# Patient Record
Sex: Female | Born: 1975 | Hispanic: No | Marital: Married | Smoking: Never smoker
Health system: Southern US, Community
[De-identification: ages and names within clinical notes are randomized; demographics above are authoritative.]

## PROBLEM LIST (undated history)

## (undated) ENCOUNTER — Inpatient Hospital Stay (HOSPITAL_COMMUNITY): Payer: Self-pay

## (undated) DIAGNOSIS — R011 Cardiac murmur, unspecified: Secondary | ICD-10-CM

## (undated) DIAGNOSIS — O24419 Gestational diabetes mellitus in pregnancy, unspecified control: Secondary | ICD-10-CM

## (undated) DIAGNOSIS — Z800 Family history of malignant neoplasm of digestive organs: Secondary | ICD-10-CM

## (undated) DIAGNOSIS — A159 Respiratory tuberculosis unspecified: Secondary | ICD-10-CM

## (undated) DIAGNOSIS — D0510 Intraductal carcinoma in situ of unspecified breast: Secondary | ICD-10-CM

## (undated) DIAGNOSIS — Z8041 Family history of malignant neoplasm of ovary: Secondary | ICD-10-CM

## (undated) HISTORY — PX: UPPER GASTROINTESTINAL ENDOSCOPY: SHX188

## (undated) HISTORY — DX: Gestational diabetes mellitus in pregnancy, unspecified control: O24.419

## (undated) HISTORY — DX: Family history of malignant neoplasm of ovary: Z80.41

## (undated) HISTORY — PX: APPENDECTOMY: SHX54

## (undated) HISTORY — DX: Respiratory tuberculosis unspecified: A15.9

## (undated) HISTORY — DX: Cardiac murmur, unspecified: R01.1

## (undated) HISTORY — DX: Family history of malignant neoplasm of digestive organs: Z80.0

---

## 2009-10-25 ENCOUNTER — Encounter: Admission: RE | Admit: 2009-10-25 | Discharge: 2009-10-25 | Payer: Self-pay | Admitting: Infectious Diseases

## 2010-01-19 ENCOUNTER — Ambulatory Visit (HOSPITAL_COMMUNITY)
Admission: RE | Admit: 2010-01-19 | Discharge: 2010-01-19 | Payer: Self-pay | Source: Home / Self Care | Attending: Family Medicine | Admitting: Family Medicine

## 2010-01-19 ENCOUNTER — Encounter (INDEPENDENT_AMBULATORY_CARE_PROVIDER_SITE_OTHER): Payer: Self-pay | Admitting: Family Medicine

## 2010-02-20 ENCOUNTER — Encounter (INDEPENDENT_AMBULATORY_CARE_PROVIDER_SITE_OTHER): Payer: Self-pay | Admitting: Family Medicine

## 2010-02-20 LAB — CONVERTED CEMR LAB
Basophils Absolute: 0 10*3/uL (ref 0.0–0.1)
Basophils Relative: 1 % (ref 0–1)
Eosinophils Relative: 3 % (ref 0–5)
HCT: 39.1 % (ref 36.0–46.0)
Hemoglobin: 12 g/dL (ref 12.0–15.0)
MCHC: 30.7 g/dL (ref 30.0–36.0)
Monocytes Absolute: 0.3 10*3/uL (ref 0.1–1.0)
Monocytes Relative: 7 % (ref 3–12)
RBC: 4.53 M/uL (ref 3.87–5.11)
RDW: 19 % — ABNORMAL HIGH (ref 11.5–15.5)

## 2010-07-25 ENCOUNTER — Other Ambulatory Visit (HOSPITAL_COMMUNITY): Payer: Self-pay | Admitting: Family Medicine

## 2010-07-25 ENCOUNTER — Ambulatory Visit (HOSPITAL_COMMUNITY)
Admission: RE | Admit: 2010-07-25 | Discharge: 2010-07-25 | Disposition: A | Discharge status: Home / Self Care | Payer: Medicaid Other | Source: Ambulatory Visit | Attending: Family Medicine | Admitting: Family Medicine

## 2010-07-25 DIAGNOSIS — M25579 Pain in unspecified ankle and joints of unspecified foot: Secondary | ICD-10-CM

## 2011-02-26 ENCOUNTER — Ambulatory Visit
Admission: RE | Admit: 2011-02-26 | Discharge: 2011-02-26 | Disposition: A | Discharge status: Home / Self Care | Payer: Medicaid Other | Source: Ambulatory Visit | Attending: Neurology | Admitting: Neurology

## 2011-02-26 ENCOUNTER — Other Ambulatory Visit: Payer: Self-pay | Admitting: Neurology

## 2011-02-26 DIAGNOSIS — G971 Other reaction to spinal and lumbar puncture: Secondary | ICD-10-CM

## 2011-02-26 NOTE — Progress Notes (Signed)
Explained discharge instructions to pt's niece as pt does not speak english.  The niece explained the instructions to the pt and niece states that the pt understands.

## 2013-01-01 NOTE — L&D Delivery Note (Signed)
Delivery Note  Progressed quickly to complete dialtation.At 1:50 AM a viable and healthy female was delivered via Vaginal, Spontaneous Delivery (Presentation:LOA ;  ).  APGAR: 9, 9; weight 7 lb 4 oz (3289 g).   Placenta status: Intact, Spontaneous.  Cord: 3 vessels with the following complications: . No difficulty with shoulders  Anesthesia: Epidural  Episiotomy: None Lacerations: 1st degree Suture Repair: 3.0 monocryl Est. Blood Loss (mL): 150  Mom to postpartum.  Baby to Couplet care / Skin to Skin. Supervised by Carmelia Roller CNM  [place], [place] 08/24/2013, 3:55 AM  Attended delivery with student Agree with note Seabron Spates, CNM

## 2013-03-30 LAB — PREGNANCY, URINE

## 2013-04-13 ENCOUNTER — Ambulatory Visit (INDEPENDENT_AMBULATORY_CARE_PROVIDER_SITE_OTHER): Payer: 59 | Admitting: Family Medicine

## 2013-04-13 ENCOUNTER — Encounter: Payer: Self-pay | Admitting: Family Medicine

## 2013-04-13 ENCOUNTER — Other Ambulatory Visit (HOSPITAL_COMMUNITY)
Admission: RE | Admit: 2013-04-13 | Discharge: 2013-04-13 | Disposition: A | Discharge status: Home / Self Care | Payer: Medicaid Other | Source: Ambulatory Visit | Attending: Family Medicine | Admitting: Family Medicine

## 2013-04-13 VITALS — BP 96/66 | Temp 97.8°F | Ht 62.0 in | Wt 122.8 lb

## 2013-04-13 DIAGNOSIS — Z3490 Encounter for supervision of normal pregnancy, unspecified, unspecified trimester: Secondary | ICD-10-CM

## 2013-04-13 DIAGNOSIS — Z789 Other specified health status: Secondary | ICD-10-CM | POA: Insufficient documentation

## 2013-04-13 DIAGNOSIS — Z113 Encounter for screening for infections with a predominantly sexual mode of transmission: Secondary | ICD-10-CM | POA: Insufficient documentation

## 2013-04-13 DIAGNOSIS — Z1151 Encounter for screening for human papillomavirus (HPV): Secondary | ICD-10-CM | POA: Insufficient documentation

## 2013-04-13 DIAGNOSIS — Z609 Problem related to social environment, unspecified: Secondary | ICD-10-CM

## 2013-04-13 DIAGNOSIS — Z124 Encounter for screening for malignant neoplasm of cervix: Secondary | ICD-10-CM | POA: Insufficient documentation

## 2013-04-13 DIAGNOSIS — O09529 Supervision of elderly multigravida, unspecified trimester: Secondary | ICD-10-CM | POA: Insufficient documentation

## 2013-04-13 DIAGNOSIS — Z3480 Encounter for supervision of other normal pregnancy, unspecified trimester: Secondary | ICD-10-CM

## 2013-04-13 LAB — POCT URINALYSIS DIP (DEVICE)
Bilirubin Urine: NEGATIVE
GLUCOSE, UA: NEGATIVE mg/dL
Hgb urine dipstick: NEGATIVE
Ketones, ur: NEGATIVE mg/dL
NITRITE: NEGATIVE
PH: 7 (ref 5.0–8.0)
Protein, ur: NEGATIVE mg/dL
SPECIFIC GRAVITY, URINE: 1.015 (ref 1.005–1.030)
UROBILINOGEN UA: 0.2 mg/dL (ref 0.0–1.0)

## 2013-04-13 NOTE — Progress Notes (Signed)
   Subjective:    Toni Crawford is a [id number] [redacted]w[redacted]d being seen today for her first obstetrical visit.  Her obstetrical history is significant for advanced maternal age. Patient does intend to breast feed. Pregnancy history fully reviewed.  Patient reports no complaints.  Filed Vitals:   04/13/13 0821 04/13/13 0822  BP: 96/66   Temp: 97.8 F (36.6 C)   Height:  5\' 2"  (1.575 m)  Weight: 122 lb 12.8 oz (55.702 kg)     HISTORY: OB History  Gravida Para Term Preterm AB SAB TAB Ectopic Multiple Living  6 4 4  1 1    3     # Outcome Date GA Lbr Len/2nd Weight Sex Delivery Anes PTL Lv  6 CUR           5 SAB 2011          4 TRM 2009 [redacted]w[redacted]d   M SVD   Y  3 TRM 2006 [redacted]w[redacted]d   F SVD None  Y  2 TRM 2000 [redacted]w[redacted]d   F SVD   N     Comments: Died as a results of an accident-10 months  1 TRM 1998 [redacted]w[redacted]d   M SVD None  Y     Past Medical History  Diagnosis Date  . Heart murmur    Past Surgical History  Procedure Laterality Date  . Appendectomy     Family History  Problem Relation Age of Onset  . Cancer Mother   . Kidney disease Father      Exam    Uterus:     Pelvic Exam:    Perineum: Normal Perineum   Vulva: Bartholin's, Urethra, Skene's normal   Vagina:  normal mucosa, normal discharge   Cervix: multiparous appearance and no bleeding following Pap   Adnexa: normal adnexa   Bony Pelvis: average  System: Breast:  normal appearance, no masses or tenderness   Skin: normal coloration and turgor, no rashes    Neurologic: normal   Extremities: normal strength, tone, and muscle mass   HEENT sclera clear, anicteric   Mouth/Teeth mucous membranes moist, pharynx normal without lesions   Neck supple   Cardiovascular: regular rate and rhythm, SEM 1/6 nml flow murmur of pregnancy   Respiratory:  appears well, vitals normal, no respiratory distress, acyanotic, normal RR, ear and throat exam is normal, neck free of mass or lymphadenopathy, chest clear, no wheezing, crepitations, rhonchi, normal  symmetric air entry   Abdomen: soft, non-tender; bowel sounds normal; no masses,  no organomegaly      Assessment:    Pregnancy: [id number] Patient Active Problem List   Diagnosis Date Noted  . Supervision of other normal pregnancy 04/13/2013    Priority: High  . AMA 04/13/2013    Priority: Medium        Plan:     Initial labs drawn. Prenatal vitamins. Problem list reviewed and updated. Genetic Screening discussed for AMA: for genetics referral and will get testing with them.Marland Kitchen  Ultrasound discussed; fetal survey: requested.  Follow up in 4 weeks.   Donnamae Jude 04/13/2013

## 2013-04-13 NOTE — Progress Notes (Signed)
[id number] 

## 2013-04-13 NOTE — Progress Notes (Signed)
Nutrition note: 1st visit consult Pt has gained 7.8# @ [redacted]w[redacted]d, which is wnl. Pt reports eating 3 meals & 1 snack/d. Intake appears low in calcium rich foods. Pt is taking a PNV. Pt reports some nausea and heartburn occ but no vomiting. NKFA. Pt received written & verbal education via language line about general nutrition during pregnancy. Discussed importance and sources of calcium rich foods. Discussed wt gain goals of 25-35# or 1#/wk. Pt agrees to try to eat more calcium-rich foods. Pt has [place] & plans to BF. F/u as needed Vladimir Faster, MS, RD, LDN, [facility]

## 2013-04-13 NOTE — Patient Instructions (Signed)
Second Trimester of Pregnancy The second trimester is from week 13 through week 28, months 4 through 6. The second trimester is often a time when you feel your best. Your body has also adjusted to being pregnant, and you begin to feel better physically. Usually, morning sickness has lessened or quit completely, you may have more energy, and you may have an increase in appetite. The second trimester is also a time when the fetus is growing rapidly. At the end of the sixth month, the fetus is about 9 inches long and weighs about 1 pounds. You will likely begin to feel the baby move (quickening) between 18 and 20 weeks of the pregnancy. BODY CHANGES Your body goes through many changes during pregnancy. The changes vary from woman to woman.   Your weight will continue to increase. You will notice your lower abdomen bulging out.  You may begin to get stretch marks on your hips, abdomen, and breasts.  You may develop headaches that can be relieved by medicines approved by your caregiver.  You may urinate more often because the fetus is pressing on your bladder.  You may develop or continue to have heartburn as a result of your pregnancy.  You may develop constipation because certain hormones are causing the muscles that push waste through your intestines to slow down.  You may develop hemorrhoids or swollen, bulging veins (varicose veins).  You may have back pain because of the weight gain and pregnancy hormones relaxing your joints between the bones in your pelvis and as a result of a shift in weight and the muscles that support your balance.  Your breasts will continue to grow and be tender.  Your gums may bleed and may be sensitive to brushing and flossing.  Dark spots or blotches (chloasma, mask of pregnancy) may develop on your face. This will likely fade after the baby is born.  A dark line from your belly button to the pubic area (linea nigra) may appear. This will likely fade after  the baby is born. WHAT TO EXPECT AT YOUR PRENATAL VISITS During a routine prenatal visit:  You will be weighed to make sure you and the fetus are growing normally.  Your blood pressure will be taken.  Your abdomen will be measured to track your baby's growth.  The fetal heartbeat will be listened to.  Any test results from the previous visit will be discussed. Your caregiver may ask you:  How you are feeling.  If you are feeling the baby move.  If you have had any abnormal symptoms, such as leaking fluid, bleeding, severe headaches, or abdominal cramping.  If you have any questions. Other tests that may be performed during your second trimester include:  Blood tests that check for:  Low iron levels (anemia).  Gestational diabetes (between 24 and 28 weeks).  Rh antibodies.  Urine tests to check for infections, diabetes, or protein in the urine.  An ultrasound to confirm the proper growth and development of the baby.  An amniocentesis to check for possible genetic problems.  Fetal screens for spina bifida and Down syndrome. HOME CARE INSTRUCTIONS   Avoid all smoking, herbs, alcohol, and unprescribed drugs. These chemicals affect the formation and growth of the baby.  Follow your caregiver's instructions regarding medicine use. There are medicines that are either safe or unsafe to take during pregnancy.  Exercise only as directed by your caregiver. Experiencing uterine cramps is a good sign to stop exercising.  Continue to eat regular,  healthy meals.  Wear a good support bra for breast tenderness.  Do not use hot tubs, steam rooms, or saunas.  Wear your seat belt at all times when driving.  Avoid raw meat, uncooked cheese, cat litter boxes, and soil used by cats. These carry germs that can cause birth defects in the baby.  Take your prenatal vitamins.  Try taking a stool softener (if your caregiver approves) if you develop constipation. Eat more high-fiber  foods, such as fresh vegetables or fruit and whole grains. Drink plenty of fluids to keep your urine clear or pale yellow.  Take warm sitz baths to soothe any pain or discomfort caused by hemorrhoids. Use hemorrhoid cream if your caregiver approves.  If you develop varicose veins, wear support hose. Elevate your feet for 15 minutes, 3 4 times a day. Limit salt in your diet.  Avoid heavy lifting, wear low heel shoes, and practice good posture.  Rest with your legs elevated if you have leg cramps or low back pain.  Visit your dentist if you have not gone yet during your pregnancy. Use a soft toothbrush to brush your teeth and be gentle when you floss.  A sexual relationship may be continued unless your caregiver directs you otherwise.  Continue to go to all your prenatal visits as directed by your caregiver. SEEK MEDICAL CARE IF:   You have dizziness.  You have mild pelvic cramps, pelvic pressure, or nagging pain in the abdominal area.  You have persistent nausea, vomiting, or diarrhea.  You have a bad smelling vaginal discharge.  You have pain with urination. SEEK IMMEDIATE MEDICAL CARE IF:   You have a fever.  You are leaking fluid from your vagina.  You have spotting or bleeding from your vagina.  You have severe abdominal cramping or pain.  You have rapid weight gain or loss.  You have shortness of breath with chest pain.  You notice sudden or extreme swelling of your face, hands, ankles, feet, or legs.  You have not felt your baby move in over an hour.  You have severe headaches that do not go away with medicine.  You have vision changes. Document Released: 12/12/2000 Document Revised: 08/20/2012 Document Reviewed: 02/19/2012 [facility] Patient Information 2014 [place].  Breastfeeding Deciding to breastfeed is one of the best choices you can make for you and your baby. A change in hormones during pregnancy causes your breast tissue to grow and increases the  number and size of your milk ducts. These hormones also allow proteins, sugars, and fats from your blood supply to make breast milk in your milk-producing glands. Hormones prevent breast milk from being released before your baby is born as well as prompt milk flow after birth. Once breastfeeding has begun, thoughts of your baby, as well as his or her sucking or crying, can stimulate the release of milk from your milk-producing glands.  BENEFITS OF BREASTFEEDING For Your Baby  Your first milk (colostrum) helps your baby's digestive system function better.   There are antibodies in your milk that help your baby fight off infections.   Your baby has a lower incidence of asthma, allergies, and sudden infant death syndrome.   The nutrients in breast milk are better for your baby than infant formulas and are designed uniquely for your baby's needs.   Breast milk improves your baby's brain development.   Your baby is less likely to develop other conditions, such as childhood obesity, asthma, or type 2 diabetes mellitus.  For  You   Breastfeeding helps to create a very special bond between you and your baby.   Breastfeeding is convenient. Breast milk is always available at the correct temperature and costs nothing.   Breastfeeding helps to burn calories and helps you lose the weight gained during pregnancy.   Breastfeeding makes your uterus contract to its prepregnancy size faster and slows bleeding (lochia) after you give birth.   Breastfeeding helps to lower your risk of developing type 2 diabetes mellitus, osteoporosis, and breast or ovarian cancer later in life. SIGNS THAT YOUR BABY IS HUNGRY Early Signs of Hunger  Increased alertness or activity.  Stretching.  Movement of the head from side to side.  Movement of the head and opening of the mouth when the corner of the mouth or cheek is stroked (rooting).  Increased sucking sounds, smacking lips, cooing, sighing, or  squeaking.  Hand-to-mouth movements.  Increased sucking of fingers or hands. Late Signs of Hunger  Fussing.  Intermittent crying. Extreme Signs of Hunger Signs of extreme hunger will require calming and consoling before your baby will be able to breastfeed successfully. Do not wait for the following signs of extreme hunger to occur before you initiate breastfeeding:   Restlessness.  A loud, strong cry.   Screaming. BREASTFEEDING BASICS Breastfeeding Initiation  Find a comfortable place to sit or lie down, with your neck and back well supported.  Place a pillow or rolled up blanket under your baby to bring him or her to the level of your breast (if you are seated). Nursing pillows are specially designed to help support your arms and your baby while you breastfeed.  Make sure that your baby's abdomen is facing your abdomen.   Gently massage your breast. With your fingertips, massage from your chest wall toward your nipple in a circular motion. This encourages milk flow. You may need to continue this action during the feeding if your milk flows slowly.  Support your breast with 4 fingers underneath and your thumb above your nipple. Make sure your fingers are well away from your nipple and your baby's mouth.   Stroke your baby's lips gently with your finger or nipple.   When your baby's mouth is open wide enough, quickly bring your baby to your breast, placing your entire nipple and as much of the colored area around your nipple (areola) as possible into your baby's mouth.   More areola should be visible above your baby's upper lip than below the lower lip.   Your baby's tongue should be between his or her lower gum and your breast.   Ensure that your baby's mouth is correctly positioned around your nipple (latched). Your baby's lips should create a seal on your breast and be turned out (everted).  It is common for your baby to suck about 2 3 minutes in order to start the  flow of breast milk. Latching Teaching your baby how to latch on to your breast properly is very important. An improper latch can cause nipple pain and decreased milk supply for you and poor weight gain in your baby. Also, if your baby is not latched onto your nipple properly, he or she may swallow some air during feeding. This can make your baby fussy. Burping your baby when you switch breasts during the feeding can help to get rid of the air. However, teaching your baby to latch on properly is still the best way to prevent fussiness from swallowing air while breastfeeding. Signs that your baby has  successfully latched on to your nipple:    Silent tugging or silent sucking, without causing you pain.   Swallowing heard between every 3 4 sucks.    Muscle movement above and in front of his or her ears while sucking.  Signs that your baby has not successfully latched on to nipple:   Sucking sounds or smacking sounds from your baby while breastfeeding.  Nipple pain. If you think your baby has not latched on correctly, slip your finger into the corner of your baby's mouth to break the suction and place it between your baby's gums. Attempt breastfeeding initiation again. Signs of Successful Breastfeeding Signs from your baby:   A gradual decrease in the number of sucks or complete cessation of sucking.   Falling asleep.   Relaxation of his or her body.   Retention of a small amount of milk in his or her mouth.   Letting go of your breast by himself or herself. Signs from you:  Breasts that have increased in firmness, weight, and size 1 3 hours after feeding.   Breasts that are softer immediately after breastfeeding.  Increased milk volume, as well as a change in milk consistency and color by the 5th day of breastfeeding.   Nipples that are not sore, cracked, or bleeding. Signs That Your Randel Books is Getting Enough Milk  Wetting at least 3 diapers in a 24-hour period. The urine  should be clear and pale yellow by age 64 days.  At least 3 stools in a 24-hour period by age 64 days. The stool should be soft and yellow.  At least 3 stools in a 24-hour period by age 616 days. The stool should be seedy and yellow.  No loss of weight greater than 10% of birth weight during the first 91 days of age.  Average weight gain of 4 7 ounces (120 210 mL) per week after age 61 days.  Consistent daily weight gain by age 65 days, without weight loss after the age of 2 weeks. After a feeding, your baby may spit up a small amount. This is common. BREASTFEEDING FREQUENCY AND DURATION Frequent feeding will help you make more milk and can prevent sore nipples and breast engorgement. Breastfeed when you feel the need to reduce the fullness of your breasts or when your baby shows signs of hunger. This is called "breastfeeding on demand." Avoid introducing a pacifier to your baby while you are working to establish breastfeeding (the first 4 6 weeks after your baby is born). After this time you may choose to use a pacifier. Research has shown that pacifier use during the first year of a baby's life decreases the risk of sudden infant death syndrome (SIDS). Allow your baby to feed on each breast as long as he or she wants. Breastfeed until your baby is finished feeding. When your baby unlatches or falls asleep while feeding from the first breast, offer the second breast. Because newborns are often sleepy in the first few weeks of life, you may need to awaken your baby to get him or her to feed. Breastfeeding times will vary from baby to baby. However, the following rules can serve as a guide to help you ensure that your baby is properly fed:  Newborns (babies 53 weeks of age or younger) may breastfeed every 1 3 hours.  Newborns should not go longer than 3 hours during the day or 5 hours during the night without breastfeeding.  You should breastfeed your baby a minimum of  8 times in a 24-hour period until  you begin to introduce solid foods to your baby at around 55 months of age. BREAST MILK PUMPING Pumping and storing breast milk allows you to ensure that your baby is exclusively fed your breast milk, even at times when you are unable to breastfeed. This is especially important if you are going back to work while you are still breastfeeding or when you are not able to be present during feedings. Your lactation consultant can give you guidelines on how long it is safe to store breast milk.  A breast pump is a machine that allows you to pump milk from your breast into a sterile bottle. The pumped breast milk can then be stored in a refrigerator or freezer. Some breast pumps are operated by hand, while others use electricity. Ask your lactation consultant which type will work best for you. Breast pumps can be purchased, but some hospitals and breastfeeding support groups lease breast pumps on a monthly basis. A lactation consultant can teach you how to hand express breast milk, if you prefer not to use a pump.  CARING FOR YOUR BREASTS WHILE YOU BREASTFEED Nipples can become dry, cracked, and sore while breastfeeding. The following recommendations can help keep your breasts moisturized and healthy:  Avoid using soap on your nipples.   Wear a supportive bra. Although not required, special nursing bras and tank tops are designed to allow access to your breasts for breastfeeding without taking off your entire bra or top. Avoid wearing underwire style bras or extremely tight bras.  Air dry your nipples for 3 4minutes after each feeding.   Use only cotton bra pads to absorb leaked breast milk. Leaking of breast milk between feedings is normal.   Use lanolin on your nipples after breastfeeding. Lanolin helps to maintain your skin's normal moisture barrier. If you use pure lanolin you do not need to wash it off before feeding your baby again. Pure lanolin is not toxic to your baby. You may also hand express a  few drops of breast milk and gently massage that milk into your nipples and allow the milk to air dry. In the first few weeks after giving birth, some women experience extremely full breasts (engorgement). Engorgement can make your breasts feel heavy, warm, and tender to the touch. Engorgement peaks within 3 5 days after you give birth. The following recommendations can help ease engorgement:  Completely empty your breasts while breastfeeding or pumping. You may want to start by applying warm, moist heat (in the shower or with warm water-soaked hand towels) just before feeding or pumping. This increases circulation and helps the milk flow. If your baby does not completely empty your breasts while breastfeeding, pump any extra milk after he or she is finished.  Wear a snug bra (nursing or regular) or tank top for 1 2 days to signal your body to slightly decrease milk production.  Apply ice packs to your breasts, unless this is too uncomfortable for you.  Make sure that your baby is latched on and positioned properly while breastfeeding. If engorgement persists after 48 hours of following these recommendations, contact your health care provider or a Science writer. OVERALL HEALTH CARE RECOMMENDATIONS WHILE BREASTFEEDING  Eat healthy foods. Alternate between meals and snacks, eating 3 of each per day. Because what you eat affects your breast milk, some of the foods may make your baby more irritable than usual. Avoid eating these foods if you are sure that they are  negatively affecting your baby.  Drink milk, fruit juice, and water to satisfy your thirst (about 10 glasses a day).   Rest often, relax, and continue to take your prenatal vitamins to prevent fatigue, stress, and anemia.  Continue breast self-awareness checks.  Avoid chewing and smoking tobacco.  Avoid alcohol and drug use. Some medicines that may be harmful to your baby can pass through breast milk. It is important to ask your  health care provider before taking any medicine, including all over-the-counter and prescription medicine as well as vitamin and herbal supplements. It is possible to become pregnant while breastfeeding. If birth control is desired, ask your health care provider about options that will be safe for your baby. SEEK MEDICAL CARE IF:   You feel like you want to stop breastfeeding or have become frustrated with breastfeeding.  You have painful breasts or nipples.  Your nipples are cracked or bleeding.  Your breasts are red, tender, or warm.  You have a swollen area on either breast.  You have a fever or chills.  You have nausea or vomiting.  You have drainage other than breast milk from your nipples.  Your breasts do not become full before feedings by the 5th day after you give birth.  You feel sad and depressed.  Your baby is too sleepy to eat well.  Your baby is having trouble sleeping.   Your baby is wetting less than 3 diapers in a 24-hour period.  Your baby has less than 3 stools in a 24-hour period.  Your baby's skin or the white part of his or her eyes becomes yellow.   Your baby is not gaining weight by 5 days of age. SEEK IMMEDIATE MEDICAL CARE IF:   Your baby is overly tired (lethargic) and does not want to wake up and feed.  Your baby develops an unexplained fever. Document Released: 12/18/2004 Document Revised: 08/20/2012 Document Reviewed: 06/11/2012 [employer] Patient Information 2014 [employer].  

## 2013-04-13 NOTE — Progress Notes (Signed)
MFM appointment scheduled 04/21/13 at 10 am for Genetic Counseling and an U/S.

## 2013-04-14 LAB — OBSTETRIC PANEL
Antibody Screen: NEGATIVE
BASOS ABS: 0 10*3/uL (ref 0.0–0.1)
Basophils Relative: 0 % (ref 0–1)
EOS ABS: 0.1 10*3/uL (ref 0.0–0.7)
EOS PCT: 1 % (ref 0–5)
HCT: 37 % (ref 36.0–46.0)
Hemoglobin: 12.2 g/dL (ref 12.0–15.0)
Hepatitis B Surface Ag: NEGATIVE
LYMPHS ABS: 1 10*3/uL (ref 0.7–4.0)
Lymphocytes Relative: 16 % (ref 12–46)
MCH: 31 pg (ref 26.0–34.0)
MCHC: 33 g/dL (ref 30.0–36.0)
MCV: 93.9 fL (ref 78.0–100.0)
Monocytes Absolute: 0.3 10*3/uL (ref 0.1–1.0)
Monocytes Relative: 5 % (ref 3–12)
NEUTROS PCT: 78 % — AB (ref 43–77)
Neutro Abs: 5.1 10*3/uL (ref 1.7–7.7)
Platelets: 205 10*3/uL (ref 150–400)
RBC: 3.94 MIL/uL (ref 3.87–5.11)
RDW: 13.9 % (ref 11.5–15.5)
Rh Type: POSITIVE
Rubella: 2.15 Index — ABNORMAL HIGH (ref <0.90)
WBC: 6.5 10*3/uL (ref 4.0–10.5)

## 2013-04-14 LAB — HIV ANTIBODY (ROUTINE TESTING W REFLEX): HIV 1&2 Ab, 4th Generation: NONREACTIVE

## 2013-04-15 LAB — HEMOGLOBINOPATHY EVALUATION
HGB A: 97 % (ref 96.8–97.8)
Hemoglobin Other: 0 %
Hgb A2 Quant: 2.5 % (ref 2.2–3.2)
Hgb F Quant: 0.5 % (ref 0.0–2.0)
Hgb S Quant: 0 %

## 2013-04-15 LAB — PRESCRIPTION MONITORING PROFILE (19 PANEL)
AMPHETAMINE/METH: NEGATIVE ng/mL
BUPRENORPHINE, URINE: NEGATIVE ng/mL
Barbiturate Screen, Urine: NEGATIVE ng/mL
Benzodiazepine Screen, Urine: NEGATIVE ng/mL
CANNABINOID SCRN UR: NEGATIVE ng/mL
COCAINE METABOLITES: NEGATIVE ng/mL
Carisoprodol, Urine: NEGATIVE ng/mL
Creatinine, Urine: 12.58 mg/dL — ABNORMAL LOW (ref >20.0)
FENTANYL URINE: NEGATIVE ng/mL
MDMA URINE: NEGATIVE ng/mL
METHADONE SCREEN, URINE: NEGATIVE ng/mL
METHAQUALONE SCREEN (URINE): NEGATIVE ng/mL
Meperidine, Ur: NEGATIVE ng/mL
Nitrites, Initial: NEGATIVE ug/mL
OPIATE SCREEN, URINE: NEGATIVE ng/mL
Oxycodone Screen, Ur: NEGATIVE ng/mL
PHENCYCLIDINE, UR: NEGATIVE ng/mL
Propoxyphene: NEGATIVE ng/mL
Tapentadol, urine: NEGATIVE ng/mL
Tramadol Scrn, Ur: NEGATIVE ng/mL
Zolpidem, Urine: NEGATIVE ng/mL
pH, Initial: 7 pH (ref 4.5–8.9)

## 2013-04-15 LAB — CULTURE, OB URINE
COLONY COUNT: NO GROWTH
Organism ID, Bacteria: NO GROWTH

## 2013-04-21 ENCOUNTER — Ambulatory Visit (HOSPITAL_COMMUNITY)
Admission: RE | Admit: 2013-04-21 | Discharge: 2013-04-21 | Disposition: A | Discharge status: Home / Self Care | Payer: 59 | Source: Ambulatory Visit | Attending: Family Medicine | Admitting: Family Medicine

## 2013-04-21 ENCOUNTER — Encounter: Payer: Self-pay | Admitting: Family Medicine

## 2013-04-21 DIAGNOSIS — O09529 Supervision of elderly multigravida, unspecified trimester: Secondary | ICD-10-CM | POA: Diagnosis present

## 2013-04-21 NOTE — Progress Notes (Signed)
Genetic Counseling  High-Risk Gestation Note  Appointment Date:  04/21/2013 Referred By: Kassie Mends, MD Date of Birth:  [birth year]    Pregnancy History: [id number] Estimated Date of Delivery: 08/30/13 Estimated Gestational Age: [redacted]w[redacted]d Attending: Renella Cunas, MD   Toni Crawford and her partner were seen for genetic counseling because of a maternal age of 38 y.o.. Pharmacologist, Toni Crawford, provided interpretation for today's visit.     They were counseled regarding maternal age and the association with risk for chromosome conditions due to nondisjunction with aging of the ova.   We reviewed chromosomes, nondisjunction, and the associated 1 in 63 risk for fetal aneuploidy at [redacted]w[redacted]d gestation related to a maternal age of 38 y.o. at delivery.  They were counseled that the risk for aneuploidy decreases as gestational age increases, accounting for those pregnancies which spontaneously abort.  We specifically discussed Down syndrome (trisomy 42), trisomies 75 and 75, and sex chromosome aneuploidies (47,XXX and 47,XXY) including the common features and prognoses of each.   We reviewed available screening options including noninvasive prenatal screening (NIPS)/cell free DNA (cfDNA) testing and detailed ultrasound.  They were counseled that screening tests are used to modify a patient's a priori risk for aneuploidy, typically based on age. This estimate provides a pregnancy specific risk assessment. We reviewed the benefits and limitations of each option. Specifically, we discussed the conditions for which each test screens, the detection rates, and false positive rates of each. They were also counseled regarding diagnostic testing via amniocentesis. We reviewed the approximate 1 in 595-638 risk for complications for amniocentesis, including spontaneous pregnancy loss. After consideration of all the options, she elected to proceed with targeted ultrasound only and declined amniocentesis and NIPS  today. She stated that she would further consider NIPS.     A complete ultrasound was performed today. The ultrasound report will be sent under separate cover. There were no visualized fetal anomalies or markers suggestive of aneuploidy. Diagnostic testing was declined today.  They understand that screening tests cannot rule out all birth defects or genetic syndromes. The patient was advised of this limitation and states she still does not want additional testing at this time.   Toni Crawford was provided with written information regarding cystic fibrosis (CF) including the carrier frequency and incidence in the Asian population, the availability of carrier testing and prenatal diagnosis if indicated.  In addition, we discussed that CF is routinely screened for as part of the [place] newborn screening panel.  She declined testing today.   Both family histories were reviewed and found to be noncontributory for birth defects, intellectual disability, and known genetic conditions. The couple reported that their second child, a daughter, died at age 19 months of age from an accident. She was reportedly healthy. The patient reported that her mother died from uterine cancer. She reported that four of her sisters have had hysterectomies. She was unsure of the indication, but stated that they did not have cancer. Without further information regarding the provided family history, an accurate genetic risk cannot be calculated. Further genetic counseling is warranted if more information is obtained.  Toni Crawford denied exposure to environmental toxins or chemical agents. She denied the use of alcohol, tobacco or street drugs. She denied significant viral illnesses during the course of her pregnancy. Her medical and surgical histories were noncontributory.   I counseled this couple regarding the above risks and available options.  The approximate face-to-face time with the genetic counselor was 74  minutes.  Kandra Nicolas  Gildardo Griffes, MS,  Certified Genetic Counselor 04/21/2013

## 2013-05-15 ENCOUNTER — Ambulatory Visit (INDEPENDENT_AMBULATORY_CARE_PROVIDER_SITE_OTHER): Payer: Medicaid Other | Admitting: Obstetrics and Gynecology

## 2013-05-15 ENCOUNTER — Encounter: Payer: Self-pay | Admitting: Obstetrics and Gynecology

## 2013-05-15 VITALS — BP 98/68 | HR 92 | Temp 98.4°F | Wt 123.0 lb

## 2013-05-15 DIAGNOSIS — Z3480 Encounter for supervision of other normal pregnancy, unspecified trimester: Secondary | ICD-10-CM

## 2013-05-15 DIAGNOSIS — O09529 Supervision of elderly multigravida, unspecified trimester: Secondary | ICD-10-CM

## 2013-05-15 LAB — POCT URINALYSIS DIP (DEVICE)
Bilirubin Urine: NEGATIVE
Glucose, UA: NEGATIVE mg/dL
Hgb urine dipstick: NEGATIVE
Ketones, ur: NEGATIVE mg/dL
Nitrite: NEGATIVE
PH: 8.5 — AB (ref 5.0–8.0)
PROTEIN: NEGATIVE mg/dL
SPECIFIC GRAVITY, URINE: 1.015 (ref 1.005–1.030)
Urobilinogen, UA: 0.2 mg/dL (ref 0.0–1.0)

## 2013-05-15 NOTE — Patient Instructions (Signed)
Second Trimester of Pregnancy The second trimester is from week 13 through week 28, months 4 through 6. The second trimester is often a time when you feel your best. Your body has also adjusted to being pregnant, and you begin to feel better physically. Usually, morning sickness has lessened or quit completely, you may have more energy, and you may have an increase in appetite. The second trimester is also a time when the fetus is growing rapidly. At the end of the sixth month, the fetus is about 9 inches long and weighs about 1 pounds. You will likely begin to feel the baby move (quickening) between 18 and 20 weeks of the pregnancy. BODY CHANGES Your body goes through many changes during pregnancy. The changes vary from woman to woman.   Your weight will continue to increase. You will notice your lower abdomen bulging out.  You may begin to get stretch marks on your hips, abdomen, and breasts.  You may develop headaches that can be relieved by medicines approved by your caregiver.  You may urinate more often because the fetus is pressing on your bladder.  You may develop or continue to have heartburn as a result of your pregnancy.  You may develop constipation because certain hormones are causing the muscles that push waste through your intestines to slow down.  You may develop hemorrhoids or swollen, bulging veins (varicose veins).  You may have back pain because of the weight gain and pregnancy hormones relaxing your joints between the bones in your pelvis and as a result of a shift in weight and the muscles that support your balance.  Your breasts will continue to grow and be tender.  Your gums may bleed and may be sensitive to brushing and flossing.  Dark spots or blotches (chloasma, mask of pregnancy) may develop on your face. This will likely fade after the baby is born.  A dark line from your belly button to the pubic area (linea nigra) may appear. This will likely fade after the  baby is born. WHAT TO EXPECT AT YOUR PRENATAL VISITS During a routine prenatal visit:  You will be weighed to make sure you and the fetus are growing normally.  Your blood pressure will be taken.  Your abdomen will be measured to track your baby's growth.  The fetal heartbeat will be listened to.  Any test results from the previous visit will be discussed. Your caregiver may ask you:  How you are feeling.  If you are feeling the baby move.  If you have had any abnormal symptoms, such as leaking fluid, bleeding, severe headaches, or abdominal cramping.  If you have any questions. Other tests that may be performed during your second trimester include:  Blood tests that check for:  Low iron levels (anemia).  Gestational diabetes (between 24 and 28 weeks).  Rh antibodies.  Urine tests to check for infections, diabetes, or protein in the urine.  An ultrasound to confirm the proper growth and development of the baby.  An amniocentesis to check for possible genetic problems.  Fetal screens for spina bifida and Down syndrome. HOME CARE INSTRUCTIONS   Avoid all smoking, herbs, alcohol, and unprescribed drugs. These chemicals affect the formation and growth of the baby.  Follow your caregiver's instructions regarding medicine use. There are medicines that are either safe or unsafe to take during pregnancy.  Exercise only as directed by your caregiver. Experiencing uterine cramps is a good sign to stop exercising.  Continue to eat regular,   healthy meals.  Wear a good support bra for breast tenderness.  Do not use hot tubs, steam rooms, or saunas.  Wear your seat belt at all times when driving.  Avoid raw meat, uncooked cheese, cat litter boxes, and soil used by cats. These carry germs that can cause birth defects in the baby.  Take your prenatal vitamins.  Try taking a stool softener (if your caregiver approves) if you develop constipation. Eat more high-fiber foods,  such as fresh vegetables or fruit and whole grains. Drink plenty of fluids to keep your urine clear or pale yellow.  Take warm sitz baths to soothe any pain or discomfort caused by hemorrhoids. Use hemorrhoid cream if your caregiver approves.  If you develop varicose veins, wear support hose. Elevate your feet for 15 minutes, 3 4 times a day. Limit salt in your diet.  Avoid heavy lifting, wear low heel shoes, and practice good posture.  Rest with your legs elevated if you have leg cramps or low back pain.  Visit your dentist if you have not gone yet during your pregnancy. Use a soft toothbrush to brush your teeth and be gentle when you floss.  A sexual relationship may be continued unless your caregiver directs you otherwise.  Continue to go to all your prenatal visits as directed by your caregiver. SEEK MEDICAL CARE IF:   You have dizziness.  You have mild pelvic cramps, pelvic pressure, or nagging pain in the abdominal area.  You have persistent nausea, vomiting, or diarrhea.  You have a bad smelling vaginal discharge.  You have pain with urination. SEEK IMMEDIATE MEDICAL CARE IF:   You have a fever.  You are leaking fluid from your vagina.  You have spotting or bleeding from your vagina.  You have severe abdominal cramping or pain.  You have rapid weight gain or loss.  You have shortness of breath with chest pain.  You notice sudden or extreme swelling of your face, hands, ankles, feet, or legs.  You have not felt your baby move in over an hour.  You have severe headaches that do not go away with medicine.  You have vision changes. Document Released: 12/12/2000 Document Revised: 08/20/2012 Document Reviewed: 02/19/2012 [employer] Patient Information 2014 [employer].  

## 2013-05-15 NOTE — Progress Notes (Signed)
Interpreter here. Doing well. Reviewed AMA risks. Labs and US WNL.

## 2013-05-15 NOTE — Progress Notes (Signed)
C/o of occasional lower abdominal/pelvic pain.

## 2013-05-28 ENCOUNTER — Encounter: Payer: Self-pay | Admitting: *Deleted

## 2013-06-12 ENCOUNTER — Ambulatory Visit (INDEPENDENT_AMBULATORY_CARE_PROVIDER_SITE_OTHER): Payer: 59 | Admitting: Advanced Practice Midwife

## 2013-06-12 ENCOUNTER — Encounter: Payer: Self-pay | Admitting: Advanced Practice Midwife

## 2013-06-12 VITALS — BP 96/64 | HR 77 | Temp 97.2°F | Wt 124.5 lb

## 2013-06-12 DIAGNOSIS — Z3480 Encounter for supervision of other normal pregnancy, unspecified trimester: Secondary | ICD-10-CM

## 2013-06-12 DIAGNOSIS — Z3490 Encounter for supervision of normal pregnancy, unspecified, unspecified trimester: Secondary | ICD-10-CM

## 2013-06-12 DIAGNOSIS — Z23 Encounter for immunization: Secondary | ICD-10-CM

## 2013-06-12 LAB — POCT URINALYSIS DIP (DEVICE)
BILIRUBIN URINE: NEGATIVE
Glucose, UA: NEGATIVE mg/dL
HGB URINE DIPSTICK: NEGATIVE
Ketones, ur: NEGATIVE mg/dL
Nitrite: NEGATIVE
PH: 7 (ref 5.0–8.0)
PROTEIN: NEGATIVE mg/dL
SPECIFIC GRAVITY, URINE: 1.01 (ref 1.005–1.030)
Urobilinogen, UA: 0.2 mg/dL (ref 0.0–1.0)

## 2013-06-12 MED ORDER — TETANUS-DIPHTH-ACELL PERTUSSIS 5-2.5-18.5 LF-MCG/0.5 IM SUSP: 0.5000 mL | Freq: Once | INTRAMUSCULAR | Status: DC

## 2013-06-12 NOTE — Progress Notes (Signed)
28 wks labs/Tdap today.  Needs prescription for prenatal vitamins

## 2013-06-12 NOTE — Progress Notes (Signed)
Doing well.  Good fetal movement, denies vaginal bleeding, LOF, regular contractions.  Does have low abdominal/pelvic pressure when standing at work.  Recommend pregnancy support belt.  Pt taking OTC PNV, reviewed ingredients and has 30 mg iron, 3832 Folic Acid.  Ok to take these if they work better for her.  She reports fullness and difficulty eating large meal.  Recommend small frequent meals/snacks during pregnancy.  Pt states understanding.  28 week labs today.  [place] interpreter present for all communication.

## 2013-06-13 LAB — OBSTETRIC PANEL
ANTIBODY SCREEN: NEGATIVE
Basophils Absolute: 0 10*3/uL (ref 0.0–0.1)
Basophils Relative: 0 % (ref 0–1)
EOS ABS: 0.1 10*3/uL (ref 0.0–0.7)
Eosinophils Relative: 1 % (ref 0–5)
HEMATOCRIT: 31.9 % — AB (ref 36.0–46.0)
HEMOGLOBIN: 10.5 g/dL — AB (ref 12.0–15.0)
Hepatitis B Surface Ag: NEGATIVE
Lymphocytes Relative: 15 % (ref 12–46)
Lymphs Abs: 1 10*3/uL (ref 0.7–4.0)
MCH: 30.9 pg (ref 26.0–34.0)
MCHC: 32.9 g/dL (ref 30.0–36.0)
MCV: 93.8 fL (ref 78.0–100.0)
MONOS PCT: 7 % (ref 3–12)
Monocytes Absolute: 0.5 10*3/uL (ref 0.1–1.0)
NEUTROS ABS: 5 10*3/uL (ref 1.7–7.7)
Neutrophils Relative %: 77 % (ref 43–77)
Platelets: 179 10*3/uL (ref 150–400)
RBC: 3.4 MIL/uL — ABNORMAL LOW (ref 3.87–5.11)
RDW: 13.3 % (ref 11.5–15.5)
Rh Type: POSITIVE
Rubella: 1.87 Index — ABNORMAL HIGH (ref <0.90)
WBC: 6.5 10*3/uL (ref 4.0–10.5)

## 2013-06-13 LAB — HIV ANTIBODY (ROUTINE TESTING W REFLEX): HIV 1&2 Ab, 4th Generation: NONREACTIVE

## 2013-06-13 LAB — GLUCOSE TOLERANCE, 1 HOUR (50G) W/O FASTING: Glucose, 1 Hour GTT: 180 mg/dL — ABNORMAL HIGH (ref 70–140)

## 2013-06-15 ENCOUNTER — Telehealth: Payer: Self-pay

## 2013-06-15 NOTE — Telephone Encounter (Signed)
Message copied by Geanie Logan on Mon Jun 15, 2013  8:13 AM ------      Message from: LEFTWICH-KIRBY, LISA A      Created: Mon Jun 15, 2013  1:34 AM       Pt 1 hour glucose screen result was 180. She needs 3 hour test scheduled as soon as possible. Please call her to come in to clinic.  Thank you. ------

## 2013-06-15 NOTE — Telephone Encounter (Signed)
Attempted to call patient with [place] ID# [id number]-- was told someone picked up but would not speak. Unable to leave message. Will attempt to reach patient later today.

## 2013-06-15 NOTE — Telephone Encounter (Signed)
Attempted to call patient with pacific interpreter ID# [phone]. Sister answered and stated patient is not home Asked to inform sister of lab appointment this Friday 06/19/13 at 0800 and to inform her that she must not have anything to eat or drink, except for water, after midnight and to expect to be here in clinic for 3 hours. Sister stated that Friday is a good day and will work for patient and that she will be at appointment. Advised to tell patient to call clinic with any questions or concerns.

## 2013-06-19 ENCOUNTER — Encounter: Payer: Self-pay | Admitting: Obstetrics & Gynecology

## 2013-06-19 ENCOUNTER — Other Ambulatory Visit: Payer: 59

## 2013-06-19 DIAGNOSIS — O24419 Gestational diabetes mellitus in pregnancy, unspecified control: Secondary | ICD-10-CM

## 2013-06-19 DIAGNOSIS — R7309 Other abnormal glucose: Secondary | ICD-10-CM

## 2013-06-19 DIAGNOSIS — Z3483 Encounter for supervision of other normal pregnancy, third trimester: Secondary | ICD-10-CM

## 2013-06-20 LAB — GLUCOSE TOLERANCE, 3 HOURS
GLUCOSE 3 HOUR GTT: 136 mg/dL (ref 70–144)
GLUCOSE, 2 HOUR-GESTATIONAL: 226 mg/dL — AB (ref 70–164)
Glucose Tolerance, 1 hour: 207 mg/dL — ABNORMAL HIGH (ref 70–189)
Glucose Tolerance, Fasting: 65 mg/dL — ABNORMAL LOW (ref 70–104)

## 2013-06-22 DIAGNOSIS — O24419 Gestational diabetes mellitus in pregnancy, unspecified control: Secondary | ICD-10-CM | POA: Insufficient documentation

## 2013-06-23 ENCOUNTER — Telehealth: Payer: Self-pay

## 2013-06-23 ENCOUNTER — Telehealth: Payer: Self-pay | Admitting: General Practice

## 2013-06-23 NOTE — Telephone Encounter (Signed)
Called patient with pacific interpreter ID [phone]. Female answered and stated she was not home but that she would be back in 5 minutes. Informed female I would call back. Appointment for diabetic education Monday 06/29/13 at 0930. Patient does not need to keep appointment for low risk OB FU Tuesday-- appointment for high risk clinic will be made for the following week.

## 2013-06-23 NOTE — Telephone Encounter (Addendum)
6/23  [id number]  Patient called and left message stating she is returning a call from this number 6/23  0953  Patient's sister had been notified of pt's new appt date and time by Lisette Grinder, RN.  No need to call pt back.  Diane Day RNC

## 2013-06-23 NOTE — Telephone Encounter (Signed)
Attempted to call patient with pacific interpreter [phone]. Patient's sister, who speaks english, picked up and stated patient is at work and will not be home until 5:30pm. Asked sister to inform patient of appointment scheduled for 06/29/13 at 0930 and that patient may call clinic with questions or concerns as well as for more information regarding reason for appointment. Also stated she can let patient know she will not need to keep Tuesday appointment because of this new appointment. Sister verbalized understanding and stated she will pass along information and her sister will be at appointment. Note sent to front office staff to cancel patient's scheduled Tuesday appointment. Patient will make f/u appointment for the following Monday when here for diabetic education.

## 2013-06-23 NOTE — Telephone Encounter (Signed)
Message copied by Geanie Logan on Tue Jun 23, 2013  8:27 AM ------      Message from: Lavonia Drafts      Created: Mon Jun 22, 2013  3:56 PM       Please call pt.  Pt 3 hour GTT.  Please schedule a [facility] appt.  She also needs a visit with the diabetic educator.             Thx            clh-S ------

## 2013-06-29 ENCOUNTER — Encounter: Payer: Self-pay | Admitting: Obstetrics & Gynecology

## 2013-06-30 ENCOUNTER — Encounter: Payer: 59 | Admitting: Advanced Practice Midwife

## 2013-07-06 ENCOUNTER — Encounter: Payer: 59 | Admitting: Obstetrics & Gynecology

## 2013-07-13 ENCOUNTER — Ambulatory Visit (INDEPENDENT_AMBULATORY_CARE_PROVIDER_SITE_OTHER): Payer: 59 | Admitting: Obstetrics & Gynecology

## 2013-07-13 ENCOUNTER — Encounter: Payer: Self-pay | Admitting: Obstetrics & Gynecology

## 2013-07-13 ENCOUNTER — Encounter: Payer: 59 | Attending: Obstetrics & Gynecology | Admitting: *Deleted

## 2013-07-13 VITALS — BP 96/61 | HR 95 | Wt 122.8 lb

## 2013-07-13 DIAGNOSIS — O99810 Abnormal glucose complicating pregnancy: Secondary | ICD-10-CM | POA: Insufficient documentation

## 2013-07-13 DIAGNOSIS — O24410 Gestational diabetes mellitus in pregnancy, diet controlled: Secondary | ICD-10-CM

## 2013-07-13 LAB — POCT URINALYSIS DIP (DEVICE)
BILIRUBIN URINE: NEGATIVE
Glucose, UA: NEGATIVE mg/dL
Hgb urine dipstick: NEGATIVE
KETONES UR: NEGATIVE mg/dL
Nitrite: NEGATIVE
PROTEIN: NEGATIVE mg/dL
Specific Gravity, Urine: <=1.005 (ref 1.005–1.030)
Urobilinogen, UA: 0.2 mg/dL (ref 0.0–1.0)
pH: 7 (ref 5.0–8.0)

## 2013-07-13 MED ORDER — GLUCOSE BLOOD VI STRP: ORAL_STRIP | Status: DC

## 2013-07-13 MED ORDER — ACCU-CHEK FASTCLIX LANCETS MISC: 1.0000 | Freq: Four times a day (QID) | Status: DC

## 2013-07-13 NOTE — Patient Instructions (Signed)
Gestational Diabetes Mellitus Gestational diabetes mellitus, often simply referred to as gestational diabetes, is a type of diabetes that some women develop during pregnancy. In gestational diabetes, the pancreas does not make enough insulin (a hormone), the cells are less responsive to the insulin that is made (insulin resistance), or both.Normally, insulin moves sugars from food into the tissue cells. The tissue cells use the sugars for energy. The lack of insulin or the lack of normal response to insulin causes excess sugars to build up in the blood instead of going into the tissue cells. As a result, high blood sugar (hyperglycemia) develops. The effect of high sugar (glucose) levels can cause many complications.  RISK FACTORS You have an increased chance of developing gestational diabetes if you have a family history of diabetes and also have one or more of the following risk factors:  A body mass index over 30 (obesity).  A previous pregnancy with gestational diabetes.  An older age at the time of pregnancy. If blood glucose levels are kept in the normal range during pregnancy, women can have a healthy pregnancy. If your blood glucose levels are not well controlled, there may be risks to you, your unborn baby (fetus), your labor and delivery, or your newborn baby.  SYMPTOMS  If symptoms are experienced, they are much like symptoms you would normally expect during pregnancy. The symptoms of gestational diabetes include:   Increased thirst (polydipsia).  Increased urination (polyuria).  Increased urination during the night (nocturia).  Weight loss. This weight loss may be rapid.  Frequent, recurring infections.  Tiredness (fatigue).  Weakness.  Vision changes, such as blurred vision.  Fruity smell to your breath.  Abdominal pain. DIAGNOSIS Diabetes is diagnosed when blood glucose levels are increased. Your blood glucose level may be checked by one or more of the following blood  tests:  A fasting blood glucose test. You will not be allowed to eat for at least 8 hours before a blood sample is taken.  A random blood glucose test. Your blood glucose is checked at any time of the day regardless of when you ate.  A hemoglobin A1c blood glucose test. A hemoglobin A1c test provides information about blood glucose control over the previous 3 months.  An oral glucose tolerance test (OGTT). Your blood glucose is measured after you have not eaten (fasted) for 1-3 hours and then after you drink a glucose-containing beverage. Since the hormones that cause insulin resistance are highest at about 24-28 weeks of a pregnancy, an OGTT is usually performed during that time. If you have risk factors for gestational diabetes, your healthcare provider may test you for gestational diabetes earlier than 24 weeks of pregnancy. TREATMENT   You will need to take diabetes medicine or insulin daily to keep blood glucose levels in the desired range.  You will need to match insulin dosing with exercise and healthy food choices. The treatment goal is to maintain the before meal (preprandial), bedtime, and overnight blood glucose level at 60-99 mg/dL during pregnancy. The treatment goal is to further maintain peak after meal blood sugar (postprandial glucose) level at 100-140 mg/dL. HOME CARE INSTRUCTIONS   Have your hemoglobin A1c level checked twice a year.  Perform daily blood glucose monitoring as directed by your healthcare provider. It is common to perform frequent blood glucose monitoring.  Monitor urine ketones when you are ill and as directed by your healthcare provider.  Take your diabetes medicine and insulin as directed by your healthcare provider to maintain  your blood glucose level in the desired range.  Never run out of diabetes medicine or insulin. It is needed every day.  Adjust insulin based on your intake of carbohydrates. Carbohydrates can raise blood glucose levels but need  to be included in your diet. Carbohydrates provide vitamins, minerals, and fiber which are an essential part of a healthy diet. Carbohydrates are found in fruits, vegetables, whole grains, dairy products, legumes, and foods containing added sugars.  Eat healthy foods. Alternate 3 meals with 3 snacks.  Maintain a healthy weight gain. The usual total expected weight gain varies according to your prepregnancy body mass index (BMI).  Carry a medical alert card or wear your medical alert jewelry.  Carry a 15 gram carbohydrate snack with you at all times to treat low blood glucose (hypoglycemia). Some examples of 15 gram carbohydrate snacks include:  Glucose tablets, 3 or 4  Glucose gel, 15 gram tube  Raisins, 2 tablespoons (24 g)  Jelly beans, 6  Animal crackers, 8  Fruit juice, regular soda, or low fat milk, 4 ounces (120 mL)  Gummy treats, 9  Recognize hypoglycemia. Hypoglycemia during pregnancy occurs with blood glucose levels of 60 mg/dL and below. The risk for hypoglycemia increases when fasting or skipping meals, during or after intense exercise, and during sleep. Hypoglycemia symptoms can include:  Tremors or shakes.  Decreased ability to concentrate.  Sweating.  Increased heart rate.  Headache.  Dry mouth.  Hunger.  Irritability.  Anxiety.  Restless sleep.  Altered speech or coordination.  Confusion.  Treat hypoglycemia promptly. If you are alert and able to safely swallow, follow the 15:15 rule:  Take 15-20 grams of rapid-acting glucose or carbohydrate. Rapid-acting options include glucose gel, glucose tablets, or 4 ounces (120 mL) of fruit juice, regular soda, or low fat milk.  Check your blood glucose level 15 minutes after taking the glucose.  Take 15-20 grams more of glucose if the repeat blood glucose level is still 70 mg/dL or below.  Eat a meal or snack within 1 hour once blood glucose levels return to normal.  Be alert to polyuria (excess  urination) and polydipsia (excess thirst) which are early signs of hyperglycemia. An early awareness of hyperglycemia allows for prompt treatment. Treat hyperglycemia as directed by your healthcare provider.  Engage in at least 30 minutes of physical activity a day or as directed by your healthcare provider. Ten minutes of physical activity timed 30 minutes after each meal is encouraged to control postprandial blood glucose levels.  Adjust your insulin dosing and food intake as needed if you start a new exercise or sport.  Follow your sick day plan at any time you are unable to eat or drink as usual.  Avoid tobacco and alcohol use.  Follow up with your healthcare provider regularly.  Follow the advice of your healthcare provider regarding your prenatal and post-delivery (postpartum) appointments, meal planning, exercise, medicines, vitamins, blood tests, other medical tests, and physical activities.  Perform daily skin and foot care. Examine your skin and feet daily for cuts, bruises, redness, nail problems, bleeding, blisters, or sores.  Brush your teeth and gums at least twice a day and floss at least once a day. Follow up with your dentist regularly.  Schedule an eye exam during the first trimester of your pregnancy or as directed by your healthcare provider.  Share your diabetes management plan with your workplace or school.  Stay up-to-date with immunizations.  Learn to manage stress.  Obtain ongoing diabetes education  and support as needed.  Learn about and consider breastfeeding your baby.  You should have your blood sugar level checked 6-12 weeks after delivery. This is done with an oral glucose tolerance test (OGTT). SEEK MEDICAL CARE IF:   You are unable to eat food or drink fluids for more than 6 hours.  You have nausea and vomiting for more than 6 hours.  You have a blood glucose level of 200 mg/dL and you have ketones in your urine.  There is a change in mental  status.  You develop vision problems.  You have a persistent headache.  You have upper abdominal pain or discomfort.  You develop an additional serious illness.  You have diarrhea for more than 6 hours.  You have been sick or have had a fever for a couple of days and are not getting better. SEEK IMMEDIATE MEDICAL CARE IF:   You have difficulty breathing.  You no longer feel the baby moving.  You are bleeding or have discharge from your vagina.  You start having premature contractions or labor. MAKE SURE YOU:  Understand these instructions.  Will watch your condition.  Will get help right away if you are not doing well or get worse. Document Released: 03/26/2000 Document Revised: 12/23/2012 Document Reviewed: 07/17/2011 [facility] Patient Information 2015 [place], [place]. This information is not intended to replace advice given to you by your health care provider. Make sure you discuss any questions you have with your health care provider.

## 2013-07-13 NOTE — Progress Notes (Signed)
Failed 3 hr GTT with BG >200, missed appt, needs DM instructions and begin QID BG testing, diet

## 2013-07-13 NOTE — Progress Notes (Signed)
DIABETES: Patient unable to wait for consult with dietitian. I reviewed basic nutrition guidelines. I recommend that she meet with Nutritionist next visit. Patient was seen on 07/13/13 for Gestational Diabetes self-management . The following learning objectives were met by the patient :   States the definition of Gestational Diabetes  States why dietary management is important in controlling blood glucose  Describes the effects of carbohydrates on blood glucose levels  Demonstrates ability to create a balanced meal plan  States when to check blood glucose levels  Demonstrates proper blood glucose monitoring techniques  States the effect of stress and exercise on blood glucose levels//  States the importance of limiting caffeine and abstaining from alcohol and smoking  Plan:  Limit carbohydrate intake. Only 1C of rice per meal. Always have protein with meal. Carbs/protein snack 3 times daily Consider  increasing your activity level by walking daily as tolerated Begin checking BG before breakfast and-2 hours after first bit of breakfast, lunch and dinner after  as directed by MD  Take medication  as directed by MD  Blood glucose monitor given: Accu Chek Nano BG Monitoring Kit Lot # [id number] Exp: 04/01/14 Blood glucose reading: 60m/dl  Patient instructed to monitor glucose levels: FBS: 60 - <90 2 hour: <120  Patient received the following handouts:  Nutrition Diabetes and Pregnancy  Carbohydrate Counting List  Meal Planning worksheet  Patient will be seen for follow-up as needed.

## 2013-07-13 NOTE — Progress Notes (Signed)
Needs refill on PNV

## 2013-07-16 ENCOUNTER — Ambulatory Visit (INDEPENDENT_AMBULATORY_CARE_PROVIDER_SITE_OTHER): Payer: 59 | Admitting: Obstetrics & Gynecology

## 2013-07-16 ENCOUNTER — Encounter: Payer: Self-pay | Admitting: Obstetrics & Gynecology

## 2013-07-16 VITALS — BP 97/72 | HR 100 | Temp 97.9°F | Wt 124.2 lb

## 2013-07-16 DIAGNOSIS — O99810 Abnormal glucose complicating pregnancy: Secondary | ICD-10-CM

## 2013-07-16 DIAGNOSIS — O24419 Gestational diabetes mellitus in pregnancy, unspecified control: Secondary | ICD-10-CM

## 2013-07-16 DIAGNOSIS — Z3480 Encounter for supervision of other normal pregnancy, unspecified trimester: Secondary | ICD-10-CM

## 2013-07-16 DIAGNOSIS — Z3493 Encounter for supervision of normal pregnancy, unspecified, third trimester: Secondary | ICD-10-CM

## 2013-07-16 LAB — POCT URINALYSIS DIP (DEVICE)
BILIRUBIN URINE: NEGATIVE
GLUCOSE, UA: NEGATIVE mg/dL
HGB URINE DIPSTICK: NEGATIVE
Nitrite: NEGATIVE
Protein, ur: NEGATIVE mg/dL
UROBILINOGEN UA: 0.2 mg/dL (ref 0.0–1.0)
pH: 7 (ref 5.0–8.0)

## 2013-07-16 MED ORDER — GLYBURIDE 2.5 MG PO TABS: 2.5000 mg | ORAL_TABLET | Freq: Every day | ORAL | Status: DC

## 2013-07-16 MED ORDER — PRENATAL 27-0.8 MG PO TABS: 1.0000 | ORAL_TABLET | Freq: Every day | ORAL | Status: DC

## 2013-07-16 NOTE — Progress Notes (Signed)
FBS 72-82, post lunch 151, 217, will add glyburide 2.5 mg po in AM. Gets diarrhea and avoids milk but coffee may be causing this as well. No reflux, takes a natural remedy called Kremil-S which has simethicone. Start NST today

## 2013-07-16 NOTE — Progress Notes (Signed)
Nutrition note: GDM diet education Pt is a newly diagnosed GDM pt. Pt has gained 9.2# @ [redacted]w[redacted]d, which is < expected. Pt received meter Monday to check her BS & received basic nutrition counseling from [facility]. BS- fasting: 72-82, 2hr pp: 81-217. Pt reports eating ~1c of rice, pickled bamboo shoots, chicken liver, and green beans ~2hrs prior to 217 reading yesterday at lunch.  Pt reports eating 2 meals & 2 snacks/d. Pt reports she used to eat ~3c of rice/ meal but now eats ~1c after Izora Gala spoke with her Monday.  Pt is taking a PNV. Pt reports no walking.  Pt received verbal & written education via interpreter about GDM diet. Encouraged pt to add physical activity after her larger meal @ lunch. Discussed wt gain goals of 25-35# or 1#/wk. Pt agrees to follow GDM diet with 3 melas & 2-3 snacks/d with proper CHO/ protein combination. F/u in 2-4 wks Vladimir Faster, MS, RD, LDN, [facility]

## 2013-07-16 NOTE — Patient Instructions (Signed)
Gestational Diabetes Mellitus Gestational diabetes mellitus, often simply referred to as gestational diabetes, is a type of diabetes that some women develop during pregnancy. In gestational diabetes, the pancreas does not make enough insulin (a hormone), the cells are less responsive to the insulin that is made (insulin resistance), or both.Normally, insulin moves sugars from food into the tissue cells. The tissue cells use the sugars for energy. The lack of insulin or the lack of normal response to insulin causes excess sugars to build up in the blood instead of going into the tissue cells. As a result, high blood sugar (hyperglycemia) develops. The effect of high sugar (glucose) levels can cause many complications.  RISK FACTORS You have an increased chance of developing gestational diabetes if you have a family history of diabetes and also have one or more of the following risk factors:  A body mass index over 30 (obesity).  A previous pregnancy with gestational diabetes.  An older age at the time of pregnancy. If blood glucose levels are kept in the normal range during pregnancy, women can have a healthy pregnancy. If your blood glucose levels are not well controlled, there may be risks to you, your unborn baby (fetus), your labor and delivery, or your newborn baby.  SYMPTOMS  If symptoms are experienced, they are much like symptoms you would normally expect during pregnancy. The symptoms of gestational diabetes include:   Increased thirst (polydipsia).  Increased urination (polyuria).  Increased urination during the night (nocturia).  Weight loss. This weight loss may be rapid.  Frequent, recurring infections.  Tiredness (fatigue).  Weakness.  Vision changes, such as blurred vision.  Fruity smell to your breath.  Abdominal pain. DIAGNOSIS Diabetes is diagnosed when blood glucose levels are increased. Your blood glucose level may be checked by one or more of the following blood  tests:  A fasting blood glucose test. You will not be allowed to eat for at least 8 hours before a blood sample is taken.  A random blood glucose test. Your blood glucose is checked at any time of the day regardless of when you ate.  A hemoglobin A1c blood glucose test. A hemoglobin A1c test provides information about blood glucose control over the previous 3 months.  An oral glucose tolerance test (OGTT). Your blood glucose is measured after you have not eaten (fasted) for 1-3 hours and then after you drink a glucose-containing beverage. Since the hormones that cause insulin resistance are highest at about 24-28 weeks of a pregnancy, an OGTT is usually performed during that time. If you have risk factors for gestational diabetes, your healthcare provider may test you for gestational diabetes earlier than 24 weeks of pregnancy. TREATMENT   You will need to take diabetes medicine or insulin daily to keep blood glucose levels in the desired range.  You will need to match insulin dosing with exercise and healthy food choices. The treatment goal is to maintain the before meal (preprandial), bedtime, and overnight blood glucose level at 60-99 mg/dL during pregnancy. The treatment goal is to further maintain peak after meal blood sugar (postprandial glucose) level at 100-140 mg/dL. HOME CARE INSTRUCTIONS   Have your hemoglobin A1c level checked twice a year.  Perform daily blood glucose monitoring as directed by your healthcare provider. It is common to perform frequent blood glucose monitoring.  Monitor urine ketones when you are ill and as directed by your healthcare provider.  Take your diabetes medicine and insulin as directed by your healthcare provider to maintain  your blood glucose level in the desired range.  Never run out of diabetes medicine or insulin. It is needed every day.  Adjust insulin based on your intake of carbohydrates. Carbohydrates can raise blood glucose levels but need  to be included in your diet. Carbohydrates provide vitamins, minerals, and fiber which are an essential part of a healthy diet. Carbohydrates are found in fruits, vegetables, whole grains, dairy products, legumes, and foods containing added sugars.  Eat healthy foods. Alternate 3 meals with 3 snacks.  Maintain a healthy weight gain. The usual total expected weight gain varies according to your prepregnancy body mass index (BMI).  Carry a medical alert card or wear your medical alert jewelry.  Carry a 15 gram carbohydrate snack with you at all times to treat low blood glucose (hypoglycemia). Some examples of 15 gram carbohydrate snacks include:  Glucose tablets, 3 or 4  Glucose gel, 15 gram tube  Raisins, 2 tablespoons (24 g)  Jelly beans, 6  Animal crackers, 8  Fruit juice, regular soda, or low fat milk, 4 ounces (120 mL)  Gummy treats, 9  Recognize hypoglycemia. Hypoglycemia during pregnancy occurs with blood glucose levels of 60 mg/dL and below. The risk for hypoglycemia increases when fasting or skipping meals, during or after intense exercise, and during sleep. Hypoglycemia symptoms can include:  Tremors or shakes.  Decreased ability to concentrate.  Sweating.  Increased heart rate.  Headache.  Dry mouth.  Hunger.  Irritability.  Anxiety.  Restless sleep.  Altered speech or coordination.  Confusion.  Treat hypoglycemia promptly. If you are alert and able to safely swallow, follow the 15:15 rule:  Take 15-20 grams of rapid-acting glucose or carbohydrate. Rapid-acting options include glucose gel, glucose tablets, or 4 ounces (120 mL) of fruit juice, regular soda, or low fat milk.  Check your blood glucose level 15 minutes after taking the glucose.  Take 15-20 grams more of glucose if the repeat blood glucose level is still 70 mg/dL or below.  Eat a meal or snack within 1 hour once blood glucose levels return to normal.  Be alert to polyuria (excess  urination) and polydipsia (excess thirst) which are early signs of hyperglycemia. An early awareness of hyperglycemia allows for prompt treatment. Treat hyperglycemia as directed by your healthcare provider.  Engage in at least 30 minutes of physical activity a day or as directed by your healthcare provider. Ten minutes of physical activity timed 30 minutes after each meal is encouraged to control postprandial blood glucose levels.  Adjust your insulin dosing and food intake as needed if you start a new exercise or sport.  Follow your sick day plan at any time you are unable to eat or drink as usual.  Avoid tobacco and alcohol use.  Follow up with your healthcare provider regularly.  Follow the advice of your healthcare provider regarding your prenatal and post-delivery (postpartum) appointments, meal planning, exercise, medicines, vitamins, blood tests, other medical tests, and physical activities.  Perform daily skin and foot care. Examine your skin and feet daily for cuts, bruises, redness, nail problems, bleeding, blisters, or sores.  Brush your teeth and gums at least twice a day and floss at least once a day. Follow up with your dentist regularly.  Schedule an eye exam during the first trimester of your pregnancy or as directed by your healthcare provider.  Share your diabetes management plan with your workplace or school.  Stay up-to-date with immunizations.  Learn to manage stress.  Obtain ongoing diabetes education  and support as needed.  Learn about and consider breastfeeding your baby.  You should have your blood sugar level checked 6-12 weeks after delivery. This is done with an oral glucose tolerance test (OGTT). SEEK MEDICAL CARE IF:   You are unable to eat food or drink fluids for more than 6 hours.  You have nausea and vomiting for more than 6 hours.  You have a blood glucose level of 200 mg/dL and you have ketones in your urine.  There is a change in mental  status.  You develop vision problems.  You have a persistent headache.  You have upper abdominal pain or discomfort.  You develop an additional serious illness.  You have diarrhea for more than 6 hours.  You have been sick or have had a fever for a couple of days and are not getting better. SEEK IMMEDIATE MEDICAL CARE IF:   You have difficulty breathing.  You no longer feel the baby moving.  You are bleeding or have discharge from your vagina.  You start having premature contractions or labor. MAKE SURE YOU:  Understand these instructions.  Will watch your condition.  Will get help right away if you are not doing well or get worse. Document Released: 03/26/2000 Document Revised: 12/23/2012 Document Reviewed: 07/17/2011 [facility] Patient Information 2015 [place], [place]. This information is not intended to replace advice given to you by your health care provider. Make sure you discuss any questions you have with your health care provider.

## 2013-07-16 NOTE — Progress Notes (Signed)
Edema-feet  Pt requested refill on PNV and test strips; PNV refilled and 12 refills ordered from Dr. Roselie Awkward on 07/13/13 informed pt just request refill from [facility]. Pt agreed

## 2013-07-21 ENCOUNTER — Ambulatory Visit (HOSPITAL_COMMUNITY)
Admission: RE | Admit: 2013-07-21 | Discharge: 2013-07-21 | Disposition: A | Discharge status: Home / Self Care | Payer: Medicaid Other | Source: Ambulatory Visit | Attending: Obstetrics & Gynecology | Admitting: Obstetrics & Gynecology

## 2013-07-21 ENCOUNTER — Ambulatory Visit (INDEPENDENT_AMBULATORY_CARE_PROVIDER_SITE_OTHER): Payer: Medicaid Other | Admitting: General Practice

## 2013-07-21 VITALS — BP 99/64 | HR 96 | Wt 124.9 lb

## 2013-07-21 DIAGNOSIS — Z3689 Encounter for other specified antenatal screening: Secondary | ICD-10-CM | POA: Diagnosis not present

## 2013-07-21 DIAGNOSIS — O99810 Abnormal glucose complicating pregnancy: Secondary | ICD-10-CM

## 2013-07-21 MED ORDER — PRENATAL PLUS 27-1 MG PO TABS: 1.0000 | ORAL_TABLET | Freq: Every day | ORAL | Status: DC

## 2013-07-21 MED ORDER — GLUCOSE BLOOD VI STRP: ORAL_STRIP | Status: DC

## 2013-07-21 NOTE — Progress Notes (Signed)
Patient to ultrasound for AFI check

## 2013-07-21 NOTE — Progress Notes (Signed)
NST performed today was reviewed and was found to be reactive.  Continue recommended antenatal testing and prenatal care.  

## 2013-07-23 ENCOUNTER — Ambulatory Visit (INDEPENDENT_AMBULATORY_CARE_PROVIDER_SITE_OTHER): Payer: Medicaid Other | Admitting: Obstetrics & Gynecology

## 2013-07-23 VITALS — BP 98/62 | HR 90 | Temp 98.7°F | Wt 123.6 lb

## 2013-07-23 DIAGNOSIS — O24410 Gestational diabetes mellitus in pregnancy, diet controlled: Secondary | ICD-10-CM

## 2013-07-23 DIAGNOSIS — O99810 Abnormal glucose complicating pregnancy: Secondary | ICD-10-CM

## 2013-07-23 LAB — POCT URINALYSIS DIP (DEVICE)
BILIRUBIN URINE: NEGATIVE
Glucose, UA: NEGATIVE mg/dL
HGB URINE DIPSTICK: NEGATIVE
Ketones, ur: NEGATIVE mg/dL
NITRITE: NEGATIVE
Protein, ur: NEGATIVE mg/dL
Specific Gravity, Urine: 1.01 (ref 1.005–1.030)
Urobilinogen, UA: 0.2 mg/dL (ref 0.0–1.0)
pH: 7 (ref 5.0–8.0)

## 2013-07-23 NOTE — Patient Instructions (Signed)
Third Trimester of Pregnancy The third trimester is from week 29 through week 42, months 7 through 9. The third trimester is a time when the fetus is growing rapidly. At the end of the ninth month, the fetus is about 20 inches in length and weighs 6-10 pounds.  BODY CHANGES Your body goes through many changes during pregnancy. The changes vary from woman to woman.   Your weight will continue to increase. You can expect to gain 25-35 pounds (11-16 kg) by the end of the pregnancy.  You may begin to get stretch marks on your hips, abdomen, and breasts.  You may urinate more often because the fetus is moving lower into your pelvis and pressing on your bladder.  You may develop or continue to have heartburn as a result of your pregnancy.  You may develop constipation because certain hormones are causing the muscles that push waste through your intestines to slow down.  You may develop hemorrhoids or swollen, bulging veins (varicose veins).  You may have pelvic pain because of the weight gain and pregnancy hormones relaxing your joints between the bones in your pelvis. Backaches may result from overexertion of the muscles supporting your posture.  You may have changes in your hair. These can include thickening of your hair, rapid growth, and changes in texture. Some women also have hair loss during or after pregnancy, or hair that feels dry or thin. Your hair will most likely return to normal after your baby is born.  Your breasts will continue to grow and be tender. A yellow discharge may leak from your breasts called colostrum.  Your belly button may stick out.  You may feel short of breath because of your expanding uterus.  You may notice the fetus "dropping," or moving lower in your abdomen.  You may have a bloody mucus discharge. This usually occurs a few days to a week before labor begins.  Your cervix becomes thin and soft (effaced) near your due date. WHAT TO EXPECT AT YOUR PRENATAL  EXAMS  You will have prenatal exams every 2 weeks until week 36. Then, you will have weekly prenatal exams. During a routine prenatal visit:  You will be weighed to make sure you and the fetus are growing normally.  Your blood pressure is taken.  Your abdomen will be measured to track your baby's growth.  The fetal heartbeat will be listened to.  Any test results from the previous visit will be discussed.  You may have a cervical check near your due date to see if you have effaced. At around 36 weeks, your caregiver will check your cervix. At the same time, your caregiver will also perform a test on the secretions of the vaginal tissue. This test is to determine if a type of bacteria, Group B streptococcus, is present. Your caregiver will explain this further. Your caregiver may ask you:  What your birth plan is.  How you are feeling.  If you are feeling the baby move.  If you have had any abnormal symptoms, such as leaking fluid, bleeding, severe headaches, or abdominal cramping.  If you have any questions. Other tests or screenings that may be performed during your third trimester include:  Blood tests that check for low iron levels (anemia).  Fetal testing to check the health, activity level, and growth of the fetus. Testing is done if you have certain medical conditions or if there are problems during the pregnancy. FALSE LABOR You may feel small, irregular contractions that   eventually go away. These are called Braxton Hicks contractions, or false labor. Contractions may last for hours, days, or even weeks before true labor sets in. If contractions come at regular intervals, intensify, or become painful, it is best to be seen by your caregiver.  SIGNS OF LABOR   Menstrual-like cramps.  Contractions that are 5 minutes apart or less.  Contractions that start on the top of the uterus and spread down to the lower abdomen and back.  A sense of increased pelvic pressure or back  pain.  A watery or bloody mucus discharge that comes from the vagina. If you have any of these signs before the 37th week of pregnancy, call your caregiver right away. You need to go to the hospital to get checked immediately. HOME CARE INSTRUCTIONS   Avoid all smoking, herbs, alcohol, and unprescribed drugs. These chemicals affect the formation and growth of the baby.  Follow your caregiver's instructions regarding medicine use. There are medicines that are either safe or unsafe to take during pregnancy.  Exercise only as directed by your caregiver. Experiencing uterine cramps is a good sign to stop exercising.  Continue to eat regular, healthy meals.  Wear a good support bra for breast tenderness.  Do not use hot tubs, steam rooms, or saunas.  Wear your seat belt at all times when driving.  Avoid raw meat, uncooked cheese, cat litter boxes, and soil used by cats. These carry germs that can cause birth defects in the baby.  Take your prenatal vitamins.  Try taking a stool softener (if your caregiver approves) if you develop constipation. Eat more high-fiber foods, such as fresh vegetables or fruit and whole grains. Drink plenty of fluids to keep your urine clear or pale yellow.  Take warm sitz baths to soothe any pain or discomfort caused by hemorrhoids. Use hemorrhoid cream if your caregiver approves.  If you develop varicose veins, wear support hose. Elevate your feet for 15 minutes, 3-4 times a day. Limit salt in your diet.  Avoid heavy lifting, wear low heal shoes, and practice good posture.  Rest a lot with your legs elevated if you have leg cramps or low back pain.  Visit your dentist if you have not gone during your pregnancy. Use a soft toothbrush to brush your teeth and be gentle when you floss.  A sexual relationship may be continued unless your caregiver directs you otherwise.  Do not travel far distances unless it is absolutely necessary and only with the approval  of your caregiver.  Take prenatal classes to understand, practice, and ask questions about the labor and delivery.  Make a trial run to the hospital.  Pack your hospital bag.  Prepare the baby's nursery.  Continue to go to all your prenatal visits as directed by your caregiver. SEEK MEDICAL CARE IF:  You are unsure if you are in labor or if your water has broken.  You have dizziness.  You have mild pelvic cramps, pelvic pressure, or nagging pain in your abdominal area.  You have persistent nausea, vomiting, or diarrhea.  You have a bad smelling vaginal discharge.  You have pain with urination. SEEK IMMEDIATE MEDICAL CARE IF:   You have a fever.  You are leaking fluid from your vagina.  You have spotting or bleeding from your vagina.  You have severe abdominal cramping or pain.  You have rapid weight loss or gain.  You have shortness of breath with chest pain.  You notice sudden or extreme swelling   of your face, hands, ankles, feet, or legs.  You have not felt your baby move in over an hour.  You have severe headaches that do not go away with medicine.  You have vision changes. Document Released: 12/12/2000 Document Revised: 12/23/2012 Document Reviewed: 02/19/2012 [employer] Patient Information 2015 [employer]. This information is not intended to replace advice given to you by your health care provider. Make sure you discuss any questions you have with your health care provider.  

## 2013-07-23 NOTE — Progress Notes (Signed)
FBS 64-81, pp lunch 78-104, supper 101-140, continue present med dose. NST reactive today

## 2013-07-23 NOTE — Progress Notes (Signed)
NST

## 2013-07-27 ENCOUNTER — Other Ambulatory Visit: Payer: 59

## 2013-07-28 ENCOUNTER — Other Ambulatory Visit: Payer: 59

## 2013-07-30 ENCOUNTER — Ambulatory Visit (INDEPENDENT_AMBULATORY_CARE_PROVIDER_SITE_OTHER): Payer: Medicaid Other | Admitting: Obstetrics & Gynecology

## 2013-07-30 VITALS — BP 101/65 | HR 71 | Temp 98.4°F | Wt 125.2 lb

## 2013-07-30 DIAGNOSIS — O24414 Gestational diabetes mellitus in pregnancy, insulin controlled: Secondary | ICD-10-CM

## 2013-07-30 DIAGNOSIS — O99810 Abnormal glucose complicating pregnancy: Secondary | ICD-10-CM

## 2013-07-30 LAB — FETAL NONSTRESS TEST

## 2013-07-30 LAB — POCT URINALYSIS DIP (DEVICE)
BILIRUBIN URINE: NEGATIVE
GLUCOSE, UA: NEGATIVE mg/dL
Hgb urine dipstick: NEGATIVE
KETONES UR: NEGATIVE mg/dL
NITRITE: NEGATIVE
Protein, ur: NEGATIVE mg/dL
Specific Gravity, Urine: 1.01 (ref 1.005–1.030)
Urobilinogen, UA: 0.2 mg/dL (ref 0.0–1.0)
pH: 7 (ref 5.0–8.0)

## 2013-07-30 NOTE — Patient Instructions (Signed)
Third Trimester of Pregnancy The third trimester is from week 29 through week 42, months 7 through 9. The third trimester is a time when the fetus is growing rapidly. At the end of the ninth month, the fetus is about 20 inches in length and weighs 6-10 pounds.  BODY CHANGES Your body goes through many changes during pregnancy. The changes vary from woman to woman.   Your weight will continue to increase. You can expect to gain 25-35 pounds (11-16 kg) by the end of the pregnancy.  You may begin to get stretch marks on your hips, abdomen, and breasts.  You may urinate more often because the fetus is moving lower into your pelvis and pressing on your bladder.  You may develop or continue to have heartburn as a result of your pregnancy.  You may develop constipation because certain hormones are causing the muscles that push waste through your intestines to slow down.  You may develop hemorrhoids or swollen, bulging veins (varicose veins).  You may have pelvic pain because of the weight gain and pregnancy hormones relaxing your joints between the bones in your pelvis. Backaches may result from overexertion of the muscles supporting your posture.  You may have changes in your hair. These can include thickening of your hair, rapid growth, and changes in texture. Some women also have hair loss during or after pregnancy, or hair that feels dry or thin. Your hair will most likely return to normal after your baby is born.  Your breasts will continue to grow and be tender. A yellow discharge may leak from your breasts called colostrum.  Your belly button may stick out.  You may feel short of breath because of your expanding uterus.  You may notice the fetus "dropping," or moving lower in your abdomen.  You may have a bloody mucus discharge. This usually occurs a few days to a week before labor begins.  Your cervix becomes thin and soft (effaced) near your due date. WHAT TO EXPECT AT YOUR PRENATAL  EXAMS  You will have prenatal exams every 2 weeks until week 36. Then, you will have weekly prenatal exams. During a routine prenatal visit:  You will be weighed to make sure you and the fetus are growing normally.  Your blood pressure is taken.  Your abdomen will be measured to track your baby's growth.  The fetal heartbeat will be listened to.  Any test results from the previous visit will be discussed.  You may have a cervical check near your due date to see if you have effaced. At around 36 weeks, your caregiver will check your cervix. At the same time, your caregiver will also perform a test on the secretions of the vaginal tissue. This test is to determine if a type of bacteria, Group B streptococcus, is present. Your caregiver will explain this further. Your caregiver may ask you:  What your birth plan is.  How you are feeling.  If you are feeling the baby move.  If you have had any abnormal symptoms, such as leaking fluid, bleeding, severe headaches, or abdominal cramping.  If you have any questions. Other tests or screenings that may be performed during your third trimester include:  Blood tests that check for low iron levels (anemia).  Fetal testing to check the health, activity level, and growth of the fetus. Testing is done if you have certain medical conditions or if there are problems during the pregnancy. FALSE LABOR You may feel small, irregular contractions that   eventually go away. These are called Braxton Hicks contractions, or false labor. Contractions may last for hours, days, or even weeks before true labor sets in. If contractions come at regular intervals, intensify, or become painful, it is best to be seen by your caregiver.  SIGNS OF LABOR   Menstrual-like cramps.  Contractions that are 5 minutes apart or less.  Contractions that start on the top of the uterus and spread down to the lower abdomen and back.  A sense of increased pelvic pressure or back  pain.  A watery or bloody mucus discharge that comes from the vagina. If you have any of these signs before the 37th week of pregnancy, call your caregiver right away. You need to go to the hospital to get checked immediately. HOME CARE INSTRUCTIONS   Avoid all smoking, herbs, alcohol, and unprescribed drugs. These chemicals affect the formation and growth of the baby.  Follow your caregiver's instructions regarding medicine use. There are medicines that are either safe or unsafe to take during pregnancy.  Exercise only as directed by your caregiver. Experiencing uterine cramps is a good sign to stop exercising.  Continue to eat regular, healthy meals.  Wear a good support bra for breast tenderness.  Do not use hot tubs, steam rooms, or saunas.  Wear your seat belt at all times when driving.  Avoid raw meat, uncooked cheese, cat litter boxes, and soil used by cats. These carry germs that can cause birth defects in the baby.  Take your prenatal vitamins.  Try taking a stool softener (if your caregiver approves) if you develop constipation. Eat more high-fiber foods, such as fresh vegetables or fruit and whole grains. Drink plenty of fluids to keep your urine clear or pale yellow.  Take warm sitz baths to soothe any pain or discomfort caused by hemorrhoids. Use hemorrhoid cream if your caregiver approves.  If you develop varicose veins, wear support hose. Elevate your feet for 15 minutes, 3-4 times a day. Limit salt in your diet.  Avoid heavy lifting, wear low heal shoes, and practice good posture.  Rest a lot with your legs elevated if you have leg cramps or low back pain.  Visit your dentist if you have not gone during your pregnancy. Use a soft toothbrush to brush your teeth and be gentle when you floss.  A sexual relationship may be continued unless your caregiver directs you otherwise.  Do not travel far distances unless it is absolutely necessary and only with the approval  of your caregiver.  Take prenatal classes to understand, practice, and ask questions about the labor and delivery.  Make a trial run to the hospital.  Pack your hospital bag.  Prepare the baby's nursery.  Continue to go to all your prenatal visits as directed by your caregiver. SEEK MEDICAL CARE IF:  You are unsure if you are in labor or if your water has broken.  You have dizziness.  You have mild pelvic cramps, pelvic pressure, or nagging pain in your abdominal area.  You have persistent nausea, vomiting, or diarrhea.  You have a bad smelling vaginal discharge.  You have pain with urination. SEEK IMMEDIATE MEDICAL CARE IF:   You have a fever.  You are leaking fluid from your vagina.  You have spotting or bleeding from your vagina.  You have severe abdominal cramping or pain.  You have rapid weight loss or gain.  You have shortness of breath with chest pain.  You notice sudden or extreme swelling   of your face, hands, ankles, feet, or legs.  You have not felt your baby move in over an hour.  You have severe headaches that do not go away with medicine.  You have vision changes. Document Released: 12/12/2000 Document Revised: 12/23/2012 Document Reviewed: 02/19/2012 [employer] Patient Information 2015 [employer]. This information is not intended to replace advice given to you by your health care provider. Make sure you discuss any questions you have with your health care provider.  

## 2013-07-30 NOTE — Progress Notes (Signed)
Pt scheduled for follow up u/s 08/06/13 @ 8:15.  Interpreter present with pt.  Pt verbalized understanding.  Carter Kitten, RNC

## 2013-07-30 NOTE — Progress Notes (Signed)
NST reactive today. Needs growth [place]. FBS and PP largely in target range one value 164.

## 2013-08-03 ENCOUNTER — Ambulatory Visit (INDEPENDENT_AMBULATORY_CARE_PROVIDER_SITE_OTHER): Payer: Medicaid Other | Admitting: *Deleted

## 2013-08-03 DIAGNOSIS — O24419 Gestational diabetes mellitus in pregnancy, unspecified control: Secondary | ICD-10-CM

## 2013-08-03 DIAGNOSIS — O99810 Abnormal glucose complicating pregnancy: Secondary | ICD-10-CM

## 2013-08-03 NOTE — Progress Notes (Signed)
NST reviewed and reactive.  Toni Crawford L. Harraway-Smith, M.D., FACOG    

## 2013-08-06 ENCOUNTER — Encounter: Payer: Self-pay | Admitting: Obstetrics and Gynecology

## 2013-08-06 ENCOUNTER — Ambulatory Visit (INDEPENDENT_AMBULATORY_CARE_PROVIDER_SITE_OTHER): Payer: Medicaid Other | Admitting: Obstetrics and Gynecology

## 2013-08-06 ENCOUNTER — Ambulatory Visit (HOSPITAL_COMMUNITY)
Admission: RE | Admit: 2013-08-06 | Discharge: 2013-08-06 | Disposition: A | Discharge status: Home / Self Care | Payer: Medicaid Other | Source: Ambulatory Visit | Attending: Obstetrics & Gynecology | Admitting: Obstetrics & Gynecology

## 2013-08-06 VITALS — BP 93/59 | HR 77 | Temp 98.6°F | Wt 125.0 lb

## 2013-08-06 DIAGNOSIS — O24419 Gestational diabetes mellitus in pregnancy, unspecified control: Secondary | ICD-10-CM

## 2013-08-06 DIAGNOSIS — O09523 Supervision of elderly multigravida, third trimester: Secondary | ICD-10-CM

## 2013-08-06 DIAGNOSIS — Z3483 Encounter for supervision of other normal pregnancy, third trimester: Secondary | ICD-10-CM

## 2013-08-06 DIAGNOSIS — O99810 Abnormal glucose complicating pregnancy: Secondary | ICD-10-CM

## 2013-08-06 DIAGNOSIS — Z3480 Encounter for supervision of other normal pregnancy, unspecified trimester: Secondary | ICD-10-CM

## 2013-08-06 DIAGNOSIS — O09529 Supervision of elderly multigravida, unspecified trimester: Secondary | ICD-10-CM

## 2013-08-06 DIAGNOSIS — Z789 Other specified health status: Secondary | ICD-10-CM

## 2013-08-06 DIAGNOSIS — Z3689 Encounter for other specified antenatal screening: Secondary | ICD-10-CM | POA: Insufficient documentation

## 2013-08-06 LAB — OB RESULTS CONSOLE GC/CHLAMYDIA
CHLAMYDIA, DNA PROBE: NEGATIVE
GC PROBE AMP, GENITAL: NEGATIVE

## 2013-08-06 LAB — POCT URINALYSIS DIP (DEVICE)
Bilirubin Urine: NEGATIVE
Glucose, UA: NEGATIVE mg/dL
KETONES UR: NEGATIVE mg/dL
Nitrite: NEGATIVE
PH: 7 (ref 5.0–8.0)
PROTEIN: NEGATIVE mg/dL
SPECIFIC GRAVITY, URINE: 1.01 (ref 1.005–1.030)
Urobilinogen, UA: 0.2 mg/dL (ref 0.0–1.0)

## 2013-08-06 LAB — OB RESULTS CONSOLE GBS: STREP GROUP B AG: NEGATIVE

## 2013-08-06 NOTE — Progress Notes (Signed)
C/o of occasional contractions at night.  GBS and cultures today.

## 2013-08-06 NOTE — Progress Notes (Signed)
US for growth done today 

## 2013-08-06 NOTE — Progress Notes (Signed)
Patient is doing well. Growth ultrasound done today but no report available for review at time of visit. CBGs all within range with the exception of 2 values after lunch 150 (patient admits to overeating) Cultures collected NST reviewed and reactive

## 2013-08-07 LAB — GC/CHLAMYDIA PROBE AMP
CT Probe RNA: NEGATIVE
GC Probe RNA: NEGATIVE

## 2013-08-08 LAB — CULTURE, BETA STREP (GROUP B ONLY)

## 2013-08-10 ENCOUNTER — Ambulatory Visit (INDEPENDENT_AMBULATORY_CARE_PROVIDER_SITE_OTHER): Payer: Medicaid Other | Admitting: *Deleted

## 2013-08-10 VITALS — BP 100/62 | HR 91

## 2013-08-10 DIAGNOSIS — O99810 Abnormal glucose complicating pregnancy: Secondary | ICD-10-CM

## 2013-08-10 DIAGNOSIS — O24419 Gestational diabetes mellitus in pregnancy, unspecified control: Secondary | ICD-10-CM

## 2013-08-13 ENCOUNTER — Ambulatory Visit (INDEPENDENT_AMBULATORY_CARE_PROVIDER_SITE_OTHER): Payer: Medicaid Other | Admitting: Obstetrics & Gynecology

## 2013-08-13 VITALS — BP 88/62 | HR 83 | Temp 98.4°F | Wt 123.8 lb

## 2013-08-13 DIAGNOSIS — Z3483 Encounter for supervision of other normal pregnancy, third trimester: Secondary | ICD-10-CM

## 2013-08-13 DIAGNOSIS — O99810 Abnormal glucose complicating pregnancy: Secondary | ICD-10-CM

## 2013-08-13 DIAGNOSIS — Z3480 Encounter for supervision of other normal pregnancy, unspecified trimester: Secondary | ICD-10-CM

## 2013-08-13 DIAGNOSIS — O24419 Gestational diabetes mellitus in pregnancy, unspecified control: Secondary | ICD-10-CM

## 2013-08-13 LAB — POCT URINALYSIS DIP (DEVICE)
BILIRUBIN URINE: NEGATIVE
BILIRUBIN URINE: NEGATIVE
GLUCOSE, UA: NEGATIVE mg/dL
GLUCOSE, UA: NEGATIVE mg/dL
Hgb urine dipstick: NEGATIVE
KETONES UR: NEGATIVE mg/dL
KETONES UR: NEGATIVE mg/dL
NITRITE: NEGATIVE
Nitrite: NEGATIVE
PROTEIN: NEGATIVE mg/dL
Protein, ur: NEGATIVE mg/dL
SPECIFIC GRAVITY, URINE: 1.01 (ref 1.005–1.030)
Specific Gravity, Urine: 1.015 (ref 1.005–1.030)
Urobilinogen, UA: 0.2 mg/dL (ref 0.0–1.0)
Urobilinogen, UA: 1 mg/dL (ref 0.0–1.0)
pH: 7 (ref 5.0–8.0)
pH: 7 (ref 5.0–8.0)

## 2013-08-13 LAB — FETAL NONSTRESS TEST

## 2013-08-13 MED ORDER — GLYBURIDE 2.5 MG PO TABS: 2.5000 mg | ORAL_TABLET | Freq: Every day | ORAL | Status: DC

## 2013-08-13 NOTE — Progress Notes (Signed)
Pt scheduled for IOL 08/23/13 @ 7:30 pm.  Instructions given to pt with interpreter.  Pt verbalized understanding.

## 2013-08-13 NOTE — Progress Notes (Signed)
Schedule IOL 39 weeks on 8/23 NST reactive today [place] good growth done 1 week ago Some low BG after breakfast and few >150 after supper, o/w in range and will continue glyburide 2.5 mg daily

## 2013-08-13 NOTE — Patient Instructions (Signed)
Etonogestrel implant What is this medicine? ETONOGESTREL (et oh noe JES trel) is a contraceptive (birth control) device. It is used to prevent pregnancy. It can be used for up to 3 years. This medicine may be used for other purposes; ask your health care provider or pharmacist if you have questions. COMMON BRAND NAME(S): Implanon, Nexplanon What should I tell my health care provider before I take this medicine? They need to know if you have any of these conditions: -abnormal vaginal bleeding -blood vessel disease or blood clots -cancer of the breast, cervix, or liver -depression -diabetes -gallbladder disease -headaches -heart disease or recent heart attack -high blood pressure -high cholesterol -kidney disease -liver disease -renal disease -seizures -tobacco smoker -an unusual or allergic reaction to etonogestrel, other hormones, anesthetics or antiseptics, medicines, foods, dyes, or preservatives -pregnant or trying to get pregnant -breast-feeding How should I use this medicine? This device is inserted just under the skin on the inner side of your upper arm by a health care professional. Talk to your pediatrician regarding the use of this medicine in children. Special care may be needed. Overdosage: If you think you've taken too much of this medicine contact a poison control center or emergency room at once. Overdosage: If you think you have taken too much of this medicine contact a poison control center or emergency room at once. NOTE: This medicine is only for you. Do not share this medicine with others. What if I miss a dose? This does not apply. What may interact with this medicine? Do not take this medicine with any of the following medications: -amprenavir -bosentan -fosamprenavir This medicine may also interact with the following medications: -barbiturate medicines for inducing sleep or treating seizures -certain medicines for fungal infections like ketoconazole and  itraconazole -griseofulvin -medicines to treat seizures like carbamazepine, felbamate, oxcarbazepine, phenytoin, topiramate -modafinil -phenylbutazone -rifampin -some medicines to treat HIV infection like atazanavir, indinavir, lopinavir, nelfinavir, tipranavir, ritonavir -St. John's wort This list may not describe all possible interactions. Give your health care provider a list of all the medicines, herbs, non-prescription drugs, or dietary supplements you use. Also tell them if you smoke, drink alcohol, or use illegal drugs. Some items may interact with your medicine. What should I watch for while using this medicine? This product does not protect you against HIV infection (AIDS) or other sexually transmitted diseases. You should be able to feel the implant by pressing your fingertips over the skin where it was inserted. Tell your doctor if you cannot feel the implant. What side effects may I notice from receiving this medicine? Side effects that you should report to your doctor or health care professional as soon as possible: -allergic reactions like skin rash, itching or hives, swelling of the face, lips, or tongue -breast lumps -changes in vision -confusion, trouble speaking or understanding -dark urine -depressed mood -general ill feeling or flu-like symptoms -light-colored stools -loss of appetite, nausea -right upper belly pain -severe headaches -severe pain, swelling, or tenderness in the abdomen -shortness of breath, chest pain, swelling in a leg -signs of pregnancy -sudden numbness or weakness of the face, arm or leg -trouble walking, dizziness, loss of balance or coordination -unusual vaginal bleeding, discharge -unusually weak or tired -yellowing of the eyes or skin Side effects that usually do not require medical attention (Report these to your doctor or health care professional if they continue or are bothersome.): -acne -breast pain -changes in  weight -cough -fever or chills -headache -irregular menstrual bleeding -itching, burning, and   vaginal discharge -pain or difficulty passing urine -sore throat This list may not describe all possible side effects. Call your doctor for medical advice about side effects. You may report side effects to FDA at 1-800-FDA-1088. Where should I keep my medicine? This drug is given in a hospital or clinic and will not be stored at home. NOTE: This sheet is a summary. It may not cover all possible information. If you have questions about this medicine, talk to your doctor, pharmacist, or health care provider.  2015, [employer]. (2011-06-25 15:37:45)  

## 2013-08-13 NOTE — Progress Notes (Signed)
Pt is requesting a smaller prenatal vitamin, difficult for her to swallow large pill.  OB visit, NST, AFI

## 2013-08-17 ENCOUNTER — Ambulatory Visit (INDEPENDENT_AMBULATORY_CARE_PROVIDER_SITE_OTHER): Payer: Medicaid Other | Admitting: *Deleted

## 2013-08-17 VITALS — BP 93/66 | HR 95

## 2013-08-17 DIAGNOSIS — O24419 Gestational diabetes mellitus in pregnancy, unspecified control: Secondary | ICD-10-CM

## 2013-08-17 DIAGNOSIS — O99810 Abnormal glucose complicating pregnancy: Secondary | ICD-10-CM

## 2013-08-18 ENCOUNTER — Telehealth (HOSPITAL_COMMUNITY): Payer: Self-pay | Admitting: *Deleted

## 2013-08-18 NOTE — Telephone Encounter (Signed)
Preadmission screen  

## 2013-08-19 ENCOUNTER — Encounter (HOSPITAL_COMMUNITY): Payer: Self-pay | Admitting: *Deleted

## 2013-08-20 ENCOUNTER — Inpatient Hospital Stay (HOSPITAL_COMMUNITY)
Admission: AD | Admit: 2013-08-20 | Discharge: 2013-08-20 | Disposition: A | Discharge status: Home / Self Care | Payer: Medicaid Other | Source: Ambulatory Visit | Attending: Family Medicine | Admitting: Family Medicine

## 2013-08-20 ENCOUNTER — Other Ambulatory Visit: Payer: Medicaid Other

## 2013-08-20 ENCOUNTER — Encounter (HOSPITAL_COMMUNITY): Payer: Self-pay | Admitting: General Practice

## 2013-08-20 ENCOUNTER — Inpatient Hospital Stay (HOSPITAL_COMMUNITY): Payer: Medicaid Other

## 2013-08-20 DIAGNOSIS — O09523 Supervision of elderly multigravida, third trimester: Secondary | ICD-10-CM

## 2013-08-20 DIAGNOSIS — O479 False labor, unspecified: Secondary | ICD-10-CM | POA: Insufficient documentation

## 2013-08-20 DIAGNOSIS — O09529 Supervision of elderly multigravida, unspecified trimester: Secondary | ICD-10-CM | POA: Diagnosis not present

## 2013-08-20 DIAGNOSIS — O471 False labor at or after 37 completed weeks of gestation: Secondary | ICD-10-CM

## 2013-08-20 DIAGNOSIS — O24419 Gestational diabetes mellitus in pregnancy, unspecified control: Secondary | ICD-10-CM

## 2013-08-20 DIAGNOSIS — O99810 Abnormal glucose complicating pregnancy: Secondary | ICD-10-CM | POA: Insufficient documentation

## 2013-08-20 DIAGNOSIS — O4700 False labor before 37 completed weeks of gestation, unspecified trimester: Secondary | ICD-10-CM

## 2013-08-20 DIAGNOSIS — O99891 Other specified diseases and conditions complicating pregnancy: Secondary | ICD-10-CM | POA: Diagnosis present

## 2013-08-20 LAB — GLUCOSE, CAPILLARY: GLUCOSE-CAPILLARY: 77 mg/dL (ref 70–99)

## 2013-08-20 NOTE — MAU Provider Note (Signed)
Chief Complaint:  Labor Eval and Rupture of Membranes   First Provider Initiated Contact with Patient 08/20/13 1030    Interpreter used.  HPI: Toni Crawford is a 38 y.o. [id number] at [redacted]w[redacted]d who states she had wetness and spot of pink blood in her underwear at 0700. Note should fluid or need to wear a pad. Denies contractions. Good fetal movement. Ate today but did not check CBG. States sugars are within target range on glyburide 2.5 mg per day except a couple of highs.   Pregnancy Course: HRC  For A2GDM, AMA. In fetal testing and IOL scheduled for 08/23/13. [place] @ 36.4: AFI 10.8, AGA 55th%ile.   Past Medical History: Past Medical History  Diagnosis Date  . Heart murmur     told with pregnancy--no symptoms  . Gestational diabetes     glyburide    Past obstetric history: OB History  Gravida Para Term Preterm AB SAB TAB Ectopic Multiple Living  6 4 4  1 1    3     # Outcome Date GA Lbr Len/2nd Weight Sex Delivery Anes PTL Lv  6 CUR           5 SAB 2011          4 TRM 2009 [redacted]w[redacted]d   M SVD   Y  3 TRM 2006 [redacted]w[redacted]d   F SVD None  Y  2 TRM 2000 [redacted]w[redacted]d   F SVD   N     Comments: Died as a results of an accident-10 months  1 TRM 1998 [redacted]w[redacted]d   M SVD None  Y      Past Surgical History: Past Surgical History  Procedure Laterality Date  . Appendectomy       Family History: Family History  Problem Relation Age of Onset  . Cancer Mother     uterus  . Kidney disease Father     Social History: History  Substance Use Topics  . Smoking status: Never Smoker   . Smokeless tobacco: Not on file  . Alcohol Use: No    Allergies: No Known Allergies  Meds:  Facility-administered medications prior to admission  Medication Dose Route Frequency Provider Last Rate Last Dose  . Tdap (BOOSTRIX) injection 0.5 mL  0.5 mL Intramuscular Once Elvera Maria, CNM       Prescriptions prior to admission  Medication Sig Dispense Refill  . glyBURIDE (DIABETA) 2.5 MG tablet Take 1 tablet (2.5 mg total) by  mouth daily with breakfast.  30 tablet  1  . Prenatal Vit-Fe Fumarate-FA (PRENATAL MULTIVITAMIN) TABS tablet Take 1 tablet by mouth daily at 12 noon.      Marland Kitchen ACCU-CHEK FASTCLIX LANCETS MISC Inject 1 each into the skin 4 (four) times daily. 648.83 for testing 4 times daily  102 each  12  . glucose blood test strip Use as instructed  50 each  12    ROS: Pertinent findings in history of present illness.  Physical Exam  Blood pressure 105/74, pulse 98, temperature 98.4 F (36.9 C), temperature source Oral, resp. rate 18, last menstrual period 11/23/2012. GENERAL: Well-developed, well-nourished female in no acute distress.  HEENT: normocephalic HEART: normal rate RESP: normal effort ABDOMEN: Soft, non-tender, gravid appropriate for gestational age EXTREMITIES: Nontender, no edema NEURO: alert and oriented SPECULUM EXAM: NEFG, scant thin discharge with brown blood noted on q-tip, no active bleeding, multiparous cervix clean. Neg pool, neg fern  Dilation: 1.5 Effacement (%): 30 Cervical Position: Posterior Exam by:: dierdre Marquia Costello, cnm  FHT:  Baseline 145-150 , moderate variability, accelerations present, variable deceleration to 100 x 1 min. Further monitoring baseline 125-130 reactive, without further decel Contractions: irregular, mild, UI   Labs: No results found for this or any previous visit (from the past 24 hour(s)).  Imaging:  US Ob Follow Up  08/06/2013   OBSTETRICAL ULTRASOUND: This exam was performed within a [facility]. The OB US report was generated in the [facility] system, and faxed to the ordering physician.   This report is available in the [facility]. See the [facility] Obstetric US report via the Image Link.  MAU Course: Prolonged monitoring with isolaged variable deceleration> BPP 8/8  Assessment: 1. GDM, class A2   2. False labor at or after 37 completed weeks of gestation, antepartum   Bloody show, no ROM Reassuring fetal parameters  Plan: Discharge  home Labor precautions and fetal kick counts    Medication List         ACCU-CHEK FASTCLIX LANCETS Misc  Inject 1 each into the skin 4 (four) times daily. 648.83 for testing 4 times daily     glucose blood test strip  Use as instructed     glyBURIDE 2.5 MG tablet  Commonly known as:  DIABETA  Take 1 tablet (2.5 mg total) by mouth daily with breakfast.     prenatal multivitamin Tabs tablet  Take 1 tablet by mouth daily at 12 noon.       Follow-up Information   Follow up with [facility] On 08/23/2013. (Keep your scheduled appointment)       Lorene Dy, CNM 08/20/2013 10:52 AM

## 2013-08-20 NOTE — MAU Note (Signed)
Pt presents to MAU with c/o leaking fluid since 0700 this am and had some bleeding that she describes as light pink. Pt denies contractions.

## 2013-08-20 NOTE — MAU Provider Note (Signed)
Attestation of Attending Supervision of Advanced Practitioner (PA/CNM/NP): Evaluation and management procedures were performed by the Advanced Practitioner under my supervision and collaboration.  I have reviewed the Advanced Practitioner's note and chart, and I agree with the management and plan.  Donnamae Jude, MD [facility] Attending 08/20/2013 1:49 PM

## 2013-08-20 NOTE — Discharge Instructions (Signed)
Braxton Hicks Contractions °Contractions of the uterus can occur throughout pregnancy. Contractions are not always a sign that you are in labor.  °WHAT ARE BRAXTON HICKS CONTRACTIONS?  °Contractions that occur before labor are called Braxton Hicks contractions, or false labor. Toward the end of pregnancy (32-34 weeks), these contractions can develop more often and may become more forceful. This is not true labor because these contractions do not result in opening (dilatation) and thinning of the cervix. They are sometimes difficult to tell apart from true labor because these contractions can be forceful and people have different pain tolerances. You should not feel embarrassed if you go to the hospital with false labor. Sometimes, the only way to tell if you are in true labor is for your health care provider to look for changes in the cervix. °If there are no prenatal problems or other health problems associated with the pregnancy, it is completely safe to be sent home with false labor and await the onset of true labor. °HOW CAN YOU TELL THE DIFFERENCE BETWEEN TRUE AND FALSE LABOR? °False Labor °· The contractions of false labor are usually shorter and not as hard as those of true labor.   °· The contractions are usually irregular.   °· The contractions are often felt in the front of the lower abdomen and in the groin.   °· The contractions may go away when you walk around or change positions while lying down.   °· The contractions get weaker and are shorter lasting as time goes on.   °· The contractions do not usually become progressively stronger, regular, and closer together as with true labor.   °True Labor °· Contractions in true labor last 30-70 seconds, become very regular, usually become more intense, and increase in frequency.   °· The contractions do not go away with walking.   °· The discomfort is usually felt in the top of the uterus and spreads to the lower abdomen and low back.   °· True labor can be  determined by your health care provider with an exam. This will show that the cervix is dilating and getting thinner.   °WHAT TO REMEMBER °· Keep up with your usual exercises and follow other instructions given by your health care provider.   °· Take medicines as directed by your health care provider.   °· Keep your regular prenatal appointments.   °· Eat and drink lightly if you think you are going into labor.   °· If Braxton Hicks contractions are making you uncomfortable:   °¨ Change your position from lying down or resting to walking, or from walking to resting.   °¨ Sit and rest in a tub of warm water.   °¨ Drink 2-3 glasses of water. Dehydration may cause these contractions.   °¨ Do slow and deep breathing several times an hour.   °WHEN SHOULD I SEEK IMMEDIATE MEDICAL CARE? °Seek immediate medical care if: °· Your contractions become stronger, more regular, and closer together.   °· You have fluid leaking or gushing from your vagina.   °· You have a fever.   °· You pass blood-tinged mucus.   °· You have vaginal bleeding.   °· You have continuous abdominal pain.   °· You have low back pain that you never had before.   °· You feel your baby's head pushing down and causing pelvic pressure.   °· Your baby is not moving as much as it used to.   °Document Released: 12/18/2004 Document Revised: 12/23/2012 Document Reviewed: 09/29/2012 °ExitCare® Patient Information ©2015 [employer]. This information is not intended to replace advice given to you by your health care   provider. Make sure you discuss any questions you have with your health care provider. ° °

## 2013-08-22 NOTE — Progress Notes (Signed)
Patient ID: Toni Crawford, female   DOB: November 09, 1975, 38 y.o.   MRN: [id number] NST 08-17-13 reviewed and reactive

## 2013-08-23 ENCOUNTER — Encounter (HOSPITAL_COMMUNITY): Payer: Self-pay

## 2013-08-23 ENCOUNTER — Inpatient Hospital Stay (HOSPITAL_COMMUNITY)
Admission: RE | Admit: 2013-08-23 | Discharge: 2013-08-25 | DRG: 775 | Disposition: A | Discharge status: Home / Self Care | Payer: Medicaid Other | Source: Ambulatory Visit | Attending: Obstetrics & Gynecology | Admitting: Obstetrics & Gynecology

## 2013-08-23 ENCOUNTER — Encounter (HOSPITAL_COMMUNITY): Payer: Self-pay | Admitting: *Deleted

## 2013-08-23 ENCOUNTER — Inpatient Hospital Stay (HOSPITAL_COMMUNITY)
Admission: AD | Admit: 2013-08-23 | Discharge: 2013-08-23 | Disposition: A | Discharge status: Home / Self Care | Payer: Medicaid Other | Source: Ambulatory Visit | Attending: Obstetrics & Gynecology | Admitting: Obstetrics & Gynecology

## 2013-08-23 ENCOUNTER — Encounter (HOSPITAL_COMMUNITY): Payer: Medicaid Other | Admitting: Anesthesiology

## 2013-08-23 ENCOUNTER — Inpatient Hospital Stay (HOSPITAL_COMMUNITY): Payer: Medicaid Other | Admitting: Anesthesiology

## 2013-08-23 VITALS — BP 89/60 | HR 62 | Temp 98.8°F | Resp 18 | Ht 62.0 in | Wt 125.0 lb

## 2013-08-23 DIAGNOSIS — O24414 Gestational diabetes mellitus in pregnancy, insulin controlled: Secondary | ICD-10-CM

## 2013-08-23 DIAGNOSIS — O99814 Abnormal glucose complicating childbirth: Secondary | ICD-10-CM | POA: Diagnosis present

## 2013-08-23 DIAGNOSIS — O09523 Supervision of elderly multigravida, third trimester: Secondary | ICD-10-CM

## 2013-08-23 DIAGNOSIS — O09529 Supervision of elderly multigravida, unspecified trimester: Secondary | ICD-10-CM | POA: Diagnosis present

## 2013-08-23 DIAGNOSIS — O24419 Gestational diabetes mellitus in pregnancy, unspecified control: Secondary | ICD-10-CM | POA: Diagnosis present

## 2013-08-23 DIAGNOSIS — O479 False labor, unspecified: Secondary | ICD-10-CM | POA: Insufficient documentation

## 2013-08-23 DIAGNOSIS — O99810 Abnormal glucose complicating pregnancy: Secondary | ICD-10-CM | POA: Diagnosis present

## 2013-08-23 DIAGNOSIS — Z3483 Encounter for supervision of other normal pregnancy, third trimester: Secondary | ICD-10-CM

## 2013-08-23 LAB — SAMPLE TO BLOOD BANK

## 2013-08-23 LAB — CBC
HEMATOCRIT: 33.1 % — AB (ref 36.0–46.0)
HEMOGLOBIN: 11.2 g/dL — AB (ref 12.0–15.0)
MCH: 32.4 pg (ref 26.0–34.0)
MCHC: 33.8 g/dL (ref 30.0–36.0)
MCV: 95.7 fL (ref 78.0–100.0)
Platelets: 137 10*3/uL — ABNORMAL LOW (ref 150–400)
RBC: 3.46 MIL/uL — ABNORMAL LOW (ref 3.87–5.11)
RDW: 13.6 % (ref 11.5–15.5)
WBC: 6.4 10*3/uL (ref 4.0–10.5)

## 2013-08-23 MED ORDER — PHENYLEPHRINE 40 MCG/ML (10ML) SYRINGE FOR IV PUSH (FOR BLOOD PRESSURE SUPPORT)
80.0000 ug | PREFILLED_SYRINGE | INTRAVENOUS | Status: DC | PRN
  Filled 2013-08-23: qty 2
  Filled 2013-08-23: qty 10

## 2013-08-23 MED ORDER — EPHEDRINE 5 MG/ML INJ
10.0000 mg | INTRAVENOUS | Status: DC | PRN
  Filled 2013-08-23: qty 2

## 2013-08-23 MED ORDER — LACTATED RINGERS IV SOLN: 500.0000 mL | INTRAVENOUS | Status: DC | PRN

## 2013-08-23 MED ORDER — LIDOCAINE HCL (PF) 1 % IJ SOLN
30.0000 mL | INTRAMUSCULAR | Status: DC | PRN
  Filled 2013-08-23: qty 30

## 2013-08-23 MED ORDER — FENTANYL 2.5 MCG/ML BUPIVACAINE 1/10 % EPIDURAL INFUSION (WH - ANES)
14.0000 mL/h | INTRAMUSCULAR | Status: DC | PRN
  Filled 2013-08-23: qty 125

## 2013-08-23 MED ORDER — IBUPROFEN 600 MG PO TABS: 600.0000 mg | ORAL_TABLET | Freq: Four times a day (QID) | ORAL | Status: DC | PRN

## 2013-08-23 MED ORDER — LACTATED RINGERS IV SOLN
INTRAVENOUS | Status: DC
  Administered 2013-08-23 (×2): via INTRAVENOUS

## 2013-08-23 MED ORDER — OXYTOCIN 40 UNITS IN LACTATED RINGERS INFUSION - SIMPLE MED
62.5000 mL/h | INTRAVENOUS | Status: DC
Start: 2013-08-23 — End: 2013-08-24
  Filled 2013-08-23: qty 1000

## 2013-08-23 MED ORDER — LACTATED RINGERS IV SOLN
500.0000 mL | Freq: Once | INTRAVENOUS | Status: AC
  Administered 2013-08-23: 500 mL via INTRAVENOUS

## 2013-08-23 MED ORDER — OXYCODONE-ACETAMINOPHEN 5-325 MG PO TABS: 1.0000 | ORAL_TABLET | ORAL | Status: DC | PRN

## 2013-08-23 MED ORDER — OXYTOCIN BOLUS FROM INFUSION
500.0000 mL | INTRAVENOUS | Status: DC
  Administered 2013-08-24: 500 mL via INTRAVENOUS

## 2013-08-23 MED ORDER — TERBUTALINE SULFATE 1 MG/ML IJ SOLN: 0.2500 mg | Freq: Once | INTRAMUSCULAR | Status: AC | PRN

## 2013-08-23 MED ORDER — OXYTOCIN 40 UNITS IN LACTATED RINGERS INFUSION - SIMPLE MED
1.0000 m[IU]/min | INTRAVENOUS | Status: DC
  Administered 2013-08-23: 2 m[IU]/min via INTRAVENOUS

## 2013-08-23 MED ORDER — CITRIC ACID-SODIUM CITRATE 334-500 MG/5ML PO SOLN: 30.0000 mL | ORAL | Status: DC | PRN

## 2013-08-23 MED ORDER — ACETAMINOPHEN 325 MG PO TABS: 650.0000 mg | ORAL_TABLET | ORAL | Status: DC | PRN

## 2013-08-23 MED ORDER — DIPHENHYDRAMINE HCL 50 MG/ML IJ SOLN: 12.5000 mg | INTRAMUSCULAR | Status: DC | PRN

## 2013-08-23 MED ORDER — ONDANSETRON HCL 4 MG/2ML IJ SOLN: 4.0000 mg | Freq: Four times a day (QID) | INTRAMUSCULAR | Status: DC | PRN

## 2013-08-23 NOTE — Discharge Instructions (Signed)
Braxton Hicks Contractions Contractions of the uterus can occur throughout pregnancy. Contractions are not always a sign that you are in labor.  WHAT ARE BRAXTON HICKS CONTRACTIONS?  Contractions that occur before labor are called Braxton Hicks contractions, or false labor. Toward the end of pregnancy (32-34 weeks), these contractions can develop more often and may become more forceful. This is not true labor because these contractions do not result in opening (dilatation) and thinning of the cervix. They are sometimes difficult to tell apart from true labor because these contractions can be forceful and people have different pain tolerances. You should not feel embarrassed if you go to the hospital with false labor. Sometimes, the only way to tell if you are in true labor is for your health care provider to look for changes in the cervix. If there are no prenatal problems or other health problems associated with the pregnancy, it is completely safe to be sent home with false labor and await the onset of true labor. HOW CAN YOU TELL THE DIFFERENCE BETWEEN TRUE AND FALSE LABOR? False Labor  The contractions of false labor are usually shorter and not as hard as those of true labor.   The contractions are usually irregular.   The contractions are often felt in the front of the lower abdomen and in the groin.   The contractions may go away when you walk around or change positions while lying down.   The contractions get weaker and are shorter lasting as time goes on.   The contractions do not usually become progressively stronger, regular, and closer together as with true labor.  True Labor  Contractions in true labor last 30-70 seconds, become very regular, usually become more intense, and increase in frequency.   The contractions do not go away with walking.   The discomfort is usually felt in the top of the uterus and spreads to the lower abdomen and low back.   True labor can be  determined by your health care provider with an exam. This will show that the cervix is dilating and getting thinner.  WHAT TO REMEMBER  Keep up with your usual exercises and follow other instructions given by your health care provider.   Take medicines as directed by your health care provider.   Keep your regular prenatal appointments.   Eat and drink lightly if you think you are going into labor.   If Braxton Hicks contractions are making you uncomfortable:   Change your position from lying down or resting to walking, or from walking to resting.   Sit and rest in a tub of warm water.   Drink 2-3 glasses of water. Dehydration may cause these contractions.   Do slow and deep breathing several times an hour.  WHEN SHOULD I SEEK IMMEDIATE MEDICAL CARE? Seek immediate medical care if:  Your contractions become stronger, more regular, and closer together.   You have fluid leaking or gushing from your vagina.   You have a fever.   You pass blood-tinged mucus.   You have vaginal bleeding.   You have continuous abdominal pain.   You have low back pain that you never had before.   You feel your baby's head pushing down and causing pelvic pressure.   Your baby is not moving as much as it used to.  Document Released: 12/18/2004 Document Revised: 12/23/2012 Document Reviewed: 09/29/2012 ExitCare Patient Information 2015 [employer]. This information is not intended to replace advice given to you by your health care   provider. Make sure you discuss any questions you have with your health care provider.  Fetal Movement Counts Patient Name: __________________________________________________ Patient Due Date: ____________________ Performing a fetal movement count is highly recommended in high-risk pregnancies, but it is good for every pregnant woman to do. Your health care provider may ask you to start counting fetal movements at 28 weeks of the pregnancy. Fetal  movements often increase:  After eating a full meal.  After physical activity.  After eating or drinking something sweet or cold.  At rest. Pay attention to when you feel the baby is most active. This will help you notice a pattern of your baby's sleep and wake cycles and what factors contribute to an increase in fetal movement. It is important to perform a fetal movement count at the same time each day when your baby is normally most active.  HOW TO COUNT FETAL MOVEMENTS 1. Find a quiet and comfortable area to sit or lie down on your left side. Lying on your left side provides the best blood and oxygen circulation to your baby. 2. Write down the day and time on a sheet of paper or in a journal. 3. Start counting kicks, flutters, swishes, rolls, or jabs in a 2-hour period. You should feel at least 10 movements within 2 hours. 4. If you do not feel 10 movements in 2 hours, wait 2-3 hours and count again. Look for a change in the pattern or not enough counts in 2 hours. SEEK MEDICAL CARE IF:  You feel less than 10 counts in 2 hours, tried twice.  There is no movement in over an hour.  The pattern is changing or taking longer each day to reach 10 counts in 2 hours.  You feel the baby is not moving as he or she usually does. Date: ____________ Movements: ____________ Start time: ____________ Finish time: ____________  Date: ____________ Movements: ____________ Start time: ____________ Finish time: ____________ Date: ____________ Movements: ____________ Start time: ____________ Finish time: ____________ Date: ____________ Movements: ____________ Start time: ____________ Finish time: ____________ Date: ____________ Movements: ____________ Start time: ____________ Finish time: ____________ Date: ____________ Movements: ____________ Start time: ____________ Finish time: ____________ Date: ____________ Movements: ____________ Start time: ____________ Finish time: ____________ Date: ____________  Movements: ____________ Start time: ____________ Finish time: ____________  Date: ____________ Movements: ____________ Start time: ____________ Finish time: ____________ Date: ____________ Movements: ____________ Start time: ____________ Finish time: ____________ Date: ____________ Movements: ____________ Start time: ____________ Finish time: ____________ Date: ____________ Movements: ____________ Start time: ____________ Finish time: ____________ Date: ____________ Movements: ____________ Start time: ____________ Finish time: ____________ Date: ____________ Movements: ____________ Start time: ____________ Finish time: ____________ Date: ____________ Movements: ____________ Start time: ____________ Finish time: ____________  Date: ____________ Movements: ____________ Start time: ____________ Finish time: ____________ Date: ____________ Movements: ____________ Start time: ____________ Finish time: ____________ Date: ____________ Movements: ____________ Start time: ____________ Finish time: ____________ Date: ____________ Movements: ____________ Start time: ____________ Finish time: ____________ Date: ____________ Movements: ____________ Start time: ____________ Finish time: ____________ Date: ____________ Movements: ____________ Start time: ____________ Finish time: ____________ Date: ____________ Movements: ____________ Start time: ____________ Finish time: ____________  Date: ____________ Movements: ____________ Start time: ____________ Finish time: ____________ Date: ____________ Movements: ____________ Start time: ____________ Finish time: ____________ Date: ____________ Movements: ____________ Start time: ____________ Finish time: ____________ Date: ____________ Movements: ____________ Start time: ____________ Finish time: ____________ Date: ____________ Movements: ____________ Start time: ____________ Finish time: ____________ Date: ____________ Movements: ____________ Start time:  ____________ Finish time: ____________ Date: ____________ Movements:   ____________ Start time: ____________ Finish time: ____________  Date: ____________ Movements: ____________ Start time: ____________ Finish time: ____________ Date: ____________ Movements: ____________ Start time: ____________ Finish time: ____________ Date: ____________ Movements: ____________ Start time: ____________ Finish time: ____________ Date: ____________ Movements: ____________ Start time: ____________ Finish time: ____________ Date: ____________ Movements: ____________ Start time: ____________ Finish time: ____________ Date: ____________ Movements: ____________ Start time: ____________ Finish time: ____________ Date: ____________ Movements: ____________ Start time: ____________ Finish time: ____________  Date: ____________ Movements: ____________ Start time: ____________ Finish time: ____________ Date: ____________ Movements: ____________ Start time: ____________ Finish time: ____________ Date: ____________ Movements: ____________ Start time: ____________ Finish time: ____________ Date: ____________ Movements: ____________ Start time: ____________ Finish time: ____________ Date: ____________ Movements: ____________ Start time: ____________ Finish time: ____________ Date: ____________ Movements: ____________ Start time: ____________ Finish time: ____________ Date: ____________ Movements: ____________ Start time: ____________ Finish time: ____________  Date: ____________ Movements: ____________ Start time: ____________ Finish time: ____________ Date: ____________ Movements: ____________ Start time: ____________ Finish time: ____________ Date: ____________ Movements: ____________ Start time: ____________ Finish time: ____________ Date: ____________ Movements: ____________ Start time: ____________ Finish time: ____________ Date: ____________ Movements: ____________ Start time: ____________ Finish time: ____________ Date:  ____________ Movements: ____________ Start time: ____________ Finish time: ____________ Date: ____________ Movements: ____________ Start time: ____________ Finish time: ____________  Date: ____________ Movements: ____________ Start time: ____________ Finish time: ____________ Date: ____________ Movements: ____________ Start time: ____________ Finish time: ____________ Date: ____________ Movements: ____________ Start time: ____________ Finish time: ____________ Date: ____________ Movements: ____________ Start time: ____________ Finish time: ____________ Date: ____________ Movements: ____________ Start time: ____________ Finish time: ____________ Date: ____________ Movements: ____________ Start time: ____________ Finish time: ____________ Document Released: 01/17/2006 Document Revised: 05/04/2013 Document Reviewed: 10/15/2011 ExitCare Patient Information 2015 [employer]. This information is not intended to replace advice given to you by your health care provider. Make sure you discuss any questions you have with your health care provider.  

## 2013-08-23 NOTE — MAU Note (Signed)
Pain started last night but it wasn't too bad, started getting more intense this morning around 0600.  Denies LOF/VB.

## 2013-08-23 NOTE — Plan of Care (Signed)
Problem: Consults Goal: Birthing Suites Patient Information Press F2 to bring up selections list Outcome: Completed/Met Date Met:  08/23/13  Pt 37-[redacted] weeks EGA

## 2013-08-23 NOTE — H&P (Signed)
Toni Crawford is a 38 y.o. female presenting for IOL for GDM A2. Maternal Medical History:  Reason for admission: IOL for GDM  Contractions: Onset was 2 days ago.   Frequency: irregular.   Duration is approximately 7 minutes.   Perceived severity is moderate.    Fetal activity: Perceived fetal activity is normal.   Last perceived fetal movement was within the past hour.    Prenatal Complications - Diabetes: gestational. Diabetes is managed by oral agent (monotherapy).      OB History   Grav Para Term Preterm Abortions TAB SAB Ect Mult Living   6 4 4  1  1   3      Past Medical History  Diagnosis Date  . Heart murmur     told with pregnancy--no symptoms  . Gestational diabetes     glyburide   Past Surgical History  Procedure Laterality Date  . Appendectomy     Family History: family history includes Cancer in her mother; Kidney disease in her father. Social History:  reports that she has never smoked. She has never used smokeless tobacco. She reports that she does not drink alcohol or use illicit drugs.  ROS denies, headache, visual changes, nausea, vomiting, palpitations, vaginal bleeding  or loss of fluid. Present contractions.  Dilation: 3.5 Effacement (%): 70 Station: -2 Exam by:: Carmelia Roller, CNM Blood pressure 105/67, pulse 73, temperature 98.6 F (37 C), temperature source Oral, resp. rate 18, height 5\' 2"  (1.575 m), weight 56.7 kg (125 lb), last menstrual period 11/23/2012. Exam Physical Exam Alert and oriented times 4 in NAD Cardiac: RRR Pulmonary: Clear to ausculation anteriorly and posteriorly GI: positive bowel sounds Leopolds performed: EFW 6.5lbs vertex, flexed attitude VE: see above  Edema: trace, non-pitting lower extremities bilaterally  EFM: Baseline 145bpm, moderate variabilty, positive accelerations, no decelerations Toco: regular contractions every 7 minutes lasting 60 seconds in duration  Prenatal labs: ABO, Rh: AB/POS/-- (06/12  1233) Antibody: NEG (06/12 1233) Rubella: 1.87 (06/12 1233) RPR: NON REAC (06/12 1233)  HBsAg: NEGATIVE (06/12 1233)  HIV: NONREACTIVE (06/12 1233)  GBS: Negative (08/06 0000)   Assessment/Plan: [id number] at [redacted]w[redacted]d admit for IOL for GDM A2 Category I tracings  Active management of labor Desires epidural Start Pitocin to obtain adequate contraction pattern expect favorable outcome due to current Bishop score of 8  [name] [facility] 08/23/2013, 11:04 PM  Seen and examined by me also Agree with note Seabron Spates, CNM

## 2013-08-23 NOTE — Anesthesia Preprocedure Evaluation (Signed)
Anesthesia Evaluation  Patient identified by MRN, date of birth, ID band Patient awake    Reviewed: Allergy & Precautions, H&P , NPO status , Patient's Chart, lab work & pertinent test results  Airway Mallampati: I TM Distance: >3 FB Neck ROM: full    Dental no notable dental hx.    Pulmonary neg pulmonary ROS,    Pulmonary exam normal       Cardiovascular negative cardio ROS      Neuro/Psych negative neurological ROS  negative psych ROS   GI/Hepatic negative GI ROS, Neg liver ROS,   Endo/Other  diabetes, Gestational  Renal/GU negative Renal ROS     Musculoskeletal   Abdominal Normal abdominal exam  (+)   Peds  Hematology negative hematology ROS (+)   Anesthesia Other Findings   Reproductive/Obstetrics (+) Pregnancy                           Anesthesia Physical Anesthesia Plan  ASA: II  Anesthesia Plan: Epidural   Post-op Pain Management:    Induction:   Airway Management Planned:   Additional Equipment:   Intra-op Plan:   Post-operative Plan:   Informed Consent: I have reviewed the patients History and Physical, chart, labs and discussed the procedure including the risks, benefits and alternatives for the proposed anesthesia with the patient or authorized representative who has indicated his/her understanding and acceptance.     Plan Discussed with:   Anesthesia Plan Comments:         Anesthesia Quick Evaluation

## 2013-08-24 ENCOUNTER — Encounter (HOSPITAL_COMMUNITY): Payer: Self-pay

## 2013-08-24 DIAGNOSIS — O99814 Abnormal glucose complicating childbirth: Secondary | ICD-10-CM

## 2013-08-24 DIAGNOSIS — O09529 Supervision of elderly multigravida, unspecified trimester: Secondary | ICD-10-CM

## 2013-08-24 LAB — CBC
HEMATOCRIT: 31.4 % — AB (ref 36.0–46.0)
Hemoglobin: 10.5 g/dL — ABNORMAL LOW (ref 12.0–15.0)
MCH: 32 pg (ref 26.0–34.0)
MCHC: 33.4 g/dL (ref 30.0–36.0)
MCV: 95.7 fL (ref 78.0–100.0)
Platelets: 121 10*3/uL — ABNORMAL LOW (ref 150–400)
RBC: 3.28 MIL/uL — ABNORMAL LOW (ref 3.87–5.11)
RDW: 13.7 % (ref 11.5–15.5)
WBC: 11.3 10*3/uL — ABNORMAL HIGH (ref 4.0–10.5)

## 2013-08-24 LAB — TYPE AND SCREEN
ABO/RH(D): AB POS
Antibody Screen: NEGATIVE

## 2013-08-24 LAB — ABO/RH: ABO/RH(D): AB POS

## 2013-08-24 LAB — RPR

## 2013-08-24 MED ORDER — SENNOSIDES-DOCUSATE SODIUM 8.6-50 MG PO TABS
2.0000 | ORAL_TABLET | ORAL | Status: DC
  Administered 2013-08-25: 2 via ORAL
  Filled 2013-08-24: qty 2

## 2013-08-24 MED ORDER — DIBUCAINE 1 % RE OINT
1.0000 | TOPICAL_OINTMENT | RECTAL | Status: DC | PRN
Start: 2013-08-24 — End: 2013-08-25

## 2013-08-24 MED ORDER — ONDANSETRON HCL 4 MG PO TABS: 4.0000 mg | ORAL_TABLET | ORAL | Status: DC | PRN

## 2013-08-24 MED ORDER — OXYCODONE-ACETAMINOPHEN 5-325 MG PO TABS: 1.0000 | ORAL_TABLET | ORAL | Status: DC | PRN

## 2013-08-24 MED ORDER — WITCH HAZEL-GLYCERIN EX PADS: 1.0000 "application " | MEDICATED_PAD | CUTANEOUS | Status: DC | PRN

## 2013-08-24 MED ORDER — ONDANSETRON HCL 4 MG/2ML IJ SOLN: 4.0000 mg | INTRAMUSCULAR | Status: DC | PRN

## 2013-08-24 MED ORDER — ZOLPIDEM TARTRATE 5 MG PO TABS: 5.0000 mg | ORAL_TABLET | Freq: Every evening | ORAL | Status: DC | PRN

## 2013-08-24 MED ORDER — BENZOCAINE-MENTHOL 20-0.5 % EX AERO
1.0000 | INHALATION_SPRAY | CUTANEOUS | Status: DC | PRN
Start: 2013-08-24 — End: 2013-08-25

## 2013-08-24 MED ORDER — IBUPROFEN 600 MG PO TABS
600.0000 mg | ORAL_TABLET | Freq: Four times a day (QID) | ORAL | Status: DC
  Administered 2013-08-24 – 2013-08-25 (×6): 600 mg via ORAL
  Filled 2013-08-24 (×6): qty 1

## 2013-08-24 MED ORDER — LIDOCAINE HCL (PF) 1 % IJ SOLN
INTRAMUSCULAR | Status: DC | PRN
  Administered 2013-08-23 (×2): 7 mL

## 2013-08-24 MED ORDER — PRENATAL MULTIVITAMIN CH
1.0000 | ORAL_TABLET | Freq: Every day | ORAL | Status: DC
  Administered 2013-08-25: 1 via ORAL
  Filled 2013-08-24: qty 1

## 2013-08-24 MED ORDER — FENTANYL 2.5 MCG/ML BUPIVACAINE 1/10 % EPIDURAL INFUSION (WH - ANES)
INTRAMUSCULAR | Status: DC | PRN
  Administered 2013-08-24: 14 mL/h via EPIDURAL

## 2013-08-24 MED ORDER — SIMETHICONE 80 MG PO CHEW
80.0000 mg | CHEWABLE_TABLET | ORAL | Status: DC | PRN
Start: 2013-08-24 — End: 2013-08-25

## 2013-08-24 MED ORDER — DIPHENHYDRAMINE HCL 25 MG PO CAPS: 25.0000 mg | ORAL_CAPSULE | Freq: Four times a day (QID) | ORAL | Status: DC | PRN

## 2013-08-24 MED ORDER — TETANUS-DIPHTH-ACELL PERTUSSIS 5-2.5-18.5 LF-MCG/0.5 IM SUSP: 0.5000 mL | Freq: Once | INTRAMUSCULAR | Status: DC

## 2013-08-24 MED ORDER — LANOLIN HYDROUS EX OINT: TOPICAL_OINTMENT | CUTANEOUS | Status: DC | PRN

## 2013-08-24 NOTE — Progress Notes (Signed)
Patient ID: Toni Crawford, female   DOB: 03-Mar-1975, 38 y.o.   MRN: [id number]  Has epidural but still having some pain with UCs  Filed Vitals:   08/24/13 0035 08/24/13 0040 08/24/13 0050 08/24/13 0100  BP: 103/67 92/59 88/51  106/77  Pulse: 69 74 67 69  Temp:      TempSrc:      Resp: 16   16  Height:      Weight:      SpO2: 98%       FHR stable, nonreactive UCs regular every 1.5-2 min  Dilation: 5 Effacement (%): 80;90 Cervical Position: Middle Station: -1 Presentation: Vertex Exam by:: Carmelia Roller, CNM  Will continue to anticipate SVD

## 2013-08-24 NOTE — Anesthesia Procedure Notes (Signed)
Epidural Patient location during procedure: OB Start time: 08/24/2013 11:55 PM End time: 08/24/2013 11:59 PM  Staffing Anesthesiologist: Lyn Hollingshead Performed by: anesthesiologist   Preanesthetic Checklist Completed: patient identified, surgical consent, pre-op evaluation, timeout performed, IV checked, risks and benefits discussed and monitors and equipment checked  Epidural Patient position: sitting Prep: site prepped and draped and DuraPrep Patient monitoring: continuous pulse ox and blood pressure Approach: midline Location: L3-L4 Injection technique: LOR air  Needle:  Needle type: Tuohy  Needle gauge: 17 G Needle length: 9 cm and 9 Needle insertion depth: 4 cm Catheter type: closed end flexible Catheter size: 19 Gauge Catheter at skin depth: 8 cm Test dose: negative and Other  Assessment Sensory level: T9 Events: blood not aspirated, injection not painful, no injection resistance, negative IV test and no paresthesia  Additional Notes Reason for block:procedure for pain

## 2013-08-24 NOTE — Lactation Note (Signed)
This note was copied from the chart of Toni Arilynn Braaten. Lactation Consultation Note  Patient Name: Toni Crawford FXOVA'N Date: 08/24/2013 Reason for consult: Initial assessment of this mother/baby dyad at 20 hours postpartum.  Mom is a multipara and her niece is present to translate, informing LC that mom breastfed 4 other children for > 18 months each but always supplemented with formula when "not enough milk."  This newborn has had some low OT's requiring formula supplement but based on mom's feeding choice, baby cannot be excluded as a breast/formula-fed baby.  Most recent OT was 51.  Baby had initial LATCH score=10 and subsequent scores are consistently=8 per RN assessment.  Feedings at breast have been 10-25 minutes each and output is above minimum for this hour of life.  Baby has received 3 formula feedings of 10-20 ml's and most recent supplement was about 2 hours ago (20 ml's) so baby asleep and not showing any hunger cues.  [place] reviewed with niece who translates to mom importance of feeding "on cue" and placing baby STS frequently.LC encouraged review of Baby and Me pp 9, 14 and 20-25 for STS and BF information.Mom encouraged to feed baby 8-12 times/24 hours and with feeding cues.  LC provided [employer] brochure and reviewed [facility] services and list of community and web site resources.      Maternal Data Formula Feeding for Exclusion: No Has patient been taught Hand Expression?:  (experienced multipara; no documentation of hand expression being taught) Does the patient have breastfeeding experience prior to this delivery?: Yes  Feeding Feeding Type: Bottle Fed - Formula Nipple Type: Slow - flow Length of feed: 20 min  LATCH Score/Interventions Latch: Grasps breast easily, tongue down, lips flanged, rhythmical sucking.  Audible Swallowing: None  Type of Nipple: Everted at rest and after stimulation  Comfort (Breast/Nipple): Soft / non-tender     Hold (Positioning): No assistance  needed to correctly position infant at breast.  LATCH Score: 8 (previous assessment of feeding, by RN)  Lactation Tools Discussed/Used   STS, cue feedings, normal newborn stomach size and small amounts needed for early feedings  Consult Status Consult Status: Follow-up Date: 08/25/13 Follow-up type: In-patient    Junious Dresser [facility] 08/24/2013, 9:57 PM

## 2013-08-24 NOTE — Anesthesia Postprocedure Evaluation (Signed)
Anesthesia Post Note  Patient: Toni Crawford  Procedure(s) Performed: * No procedures listed *  Anesthesia type: Epidural  Patient location: Mother/Baby  Post pain: Pain level controlled  Post assessment: Post-op Vital signs reviewed  Last Vitals:  Filed Vitals:   08/24/13 0520  BP: 98/59  Pulse: 78  Temp: 37.1 C  Resp: 16    Post vital signs: Reviewed  Level of consciousness:alert  Complications: No apparent anesthesia complications

## 2013-08-24 NOTE — Progress Notes (Signed)
UR chart review completed.  

## 2013-08-24 NOTE — Progress Notes (Signed)
Patient signed interpreter form allowing her sister to translate. Patient's sister used to do admission paperwork and assessment.

## 2013-08-25 MED ORDER — IBUPROFEN 600 MG PO TABS: 600.0000 mg | ORAL_TABLET | Freq: Four times a day (QID) | ORAL | Status: DC

## 2013-08-25 NOTE — Progress Notes (Signed)
Bermese interpreter on the telephone explained procedure for PKU, serum bilirubin test for baby and plan of care for mom and baby. Patient and father of baby verbalize understanding.

## 2013-08-25 NOTE — Discharge Instructions (Signed)

## 2013-08-25 NOTE — Discharge Summary (Signed)
Obstetric Discharge Summary Reason for Admission: induction of labor for A2DM Prenatal Procedures: none Intrapartum Procedures: spontaneous vaginal delivery Postpartum Procedures: none Complications-Operative and Postpartum: none Hemoglobin  Date Value Ref Range Status  08/24/2013 10.5* 12.0 - 15.0 g/dL Final     HCT  Date Value Ref Range Status  08/24/2013 31.4* 36.0 - 46.0 % Final    Discharge Diagnoses: Term Pregnancy-delivered  Hospital Course:  Toni Crawford is a 38 y.o. [id number] who presented with IOL for GDM A2.  She had a uncomplicated SVD. She was able to ambulate, tolerate PO and void normally. She was discharged home with instructions for postpartum care.    Delivery Note Progressed quickly to complete dialtation.At 1:50 AM a viable and healthy female was delivered via Vaginal, Spontaneous Delivery (Presentation:LOA ; ). APGAR: 9, 9; weight 7 lb 4 oz (3289 g).  Placenta status: Intact, Spontaneous. Cord: 3 vessels with the following complications: . No difficulty with shoulders   Anesthesia: Epidural  Episiotomy: None  Lacerations: 1st degree  Suture Repair: 3.0 monocryl  Est. Blood Loss (mL): 150  Mom to postpartum. Baby to Couplet care / Skin to Skin.  Physical Exam:  General: alert, cooperative, appears stated age and no distress Lochia: appropriate Uterine Fundus: firm DVT Evaluation: No evidence of DVT seen on physical exam. Negative Homan's sign. No Crawford or calf tenderness. No significant calf/ankle edema.  Discharge Information: Date: 08/25/2013 Activity: pelvic rest Diet: routine Medications: PNV and Ibuprofen Baby feeding: plans to breastfeed Contraception: Nexplanon Condition: stable Instructions: refer to practice specific booklet Discharge to: home Follow up in 6wks for 2hr GTT  Newborn Data: Live born female  Birth Weight: 7 lb 4 oz (3289 g) APGAR: 9, 9  Home with mother.  Toni Cords, MD [facility] PGY-1 08/25/2013, 7:27 AM  I was  present for the exam and agree with above. Offered interpreter, declined.  [place], CNM 08/25/2013 1:54 PM

## 2013-08-25 NOTE — Discharge Summary (Signed)
Attestation of Attending Supervision of Advanced Practitioner (PA/CNM/NP): Evaluation and management procedures were performed by the Advanced Practitioner under my supervision and collaboration.  I have reviewed the Advanced Practitioner's note and chart, and I agree with the management and plan.  Keyshia Orwick, MD, FACOG Attending Obstetrician & Gynecologist Faculty Practice, [facility]   

## 2013-08-26 ENCOUNTER — Encounter: Payer: Self-pay | Admitting: Family Medicine

## 2013-09-08 ENCOUNTER — Encounter: Payer: Self-pay | Admitting: General Practice

## 2013-10-02 ENCOUNTER — Encounter: Payer: Self-pay | Admitting: Family Medicine

## 2013-10-02 ENCOUNTER — Ambulatory Visit (INDEPENDENT_AMBULATORY_CARE_PROVIDER_SITE_OTHER): Payer: Medicaid Other | Admitting: Family Medicine

## 2013-10-02 DIAGNOSIS — Z30430 Encounter for insertion of intrauterine contraceptive device: Secondary | ICD-10-CM

## 2013-10-02 LAB — POCT PREGNANCY, URINE: Preg Test, Ur: NEGATIVE

## 2013-10-02 MED ORDER — ETONOGESTREL 68 MG ~~LOC~~ IMPL
68.0000 mg | DRUG_IMPLANT | Freq: Once | SUBCUTANEOUS | Status: AC
  Administered 2013-10-02: 68 mg via SUBCUTANEOUS

## 2013-10-02 NOTE — Patient Instructions (Signed)
Postpartum Depression and Baby Blues The postpartum period begins right after the birth of a baby. During this time, there is often a great amount of joy and excitement. It is also a time of many changes in the life of the parents. Regardless of how many times a mother gives birth, each child brings new challenges and dynamics to the family. It is not unusual to have feelings of excitement along with confusing shifts in moods, emotions, and thoughts. All mothers are at risk of developing postpartum depression or the "baby blues." These mood changes can occur right after giving birth, or they may occur many months after giving birth. The baby blues or postpartum depression can be mild or severe. Additionally, postpartum depression can go away rather quickly, or it can be a long-term condition.  CAUSES Raised hormone levels and the rapid drop in those levels are thought to be a main cause of postpartum depression and the baby blues. A number of hormones change during and after pregnancy. Estrogen and progesterone usually decrease right after the delivery of your baby. The levels of thyroid hormone and various cortisol steroids also rapidly drop. Other factors that play a role in these mood changes include major life events and genetics.  RISK FACTORS If you have any of the following risks for the baby blues or postpartum depression, know what symptoms to watch out for during the postpartum period. Risk factors that may increase the likelihood of getting the baby blues or postpartum depression include:  Having a personal or family history of depression.   Having depression while being pregnant.   Having premenstrual mood issues or mood issues related to oral contraceptives.  Having a lot of life stress.   Having marital conflict.   Lacking a social support network.   Having a baby with special needs.   Having health problems, such as diabetes.  SIGNS AND SYMPTOMS Symptoms of baby blues  include:  Brief changes in mood, such as going from extreme happiness to sadness.  Decreased concentration.   Difficulty sleeping.   Crying spells, tearfulness.   Irritability.   Anxiety.  Symptoms of postpartum depression typically begin within the first month after giving birth. These symptoms include:  Difficulty sleeping or excessive sleepiness.   Marked weight loss.   Agitation.   Feelings of worthlessness.   Lack of interest in activity or food.  Postpartum psychosis is a very serious condition and can be dangerous. Fortunately, it is rare. Displaying any of the following symptoms is cause for immediate medical attention. Symptoms of postpartum psychosis include:   Hallucinations and delusions.   Bizarre or disorganized behavior.   Confusion or disorientation.  DIAGNOSIS  A diagnosis is made by an evaluation of your symptoms. There are no medical or lab tests that lead to a diagnosis, but there are various questionnaires that a health care provider may use to identify those with the baby blues, postpartum depression, or psychosis. Often, a screening tool called the Edinburgh Postnatal Depression Scale is used to diagnose depression in the postpartum period.  TREATMENT The baby blues usually goes away on its own in 1-2 weeks. Social support is often all that is needed. You will be encouraged to get adequate sleep and rest. Occasionally, you may be given medicines to help you sleep.  Postpartum depression requires treatment because it can last several months or longer if it is not treated. Treatment may include individual or group therapy, medicine, or both to address any social, physiological, and psychological   factors that may play a role in the depression. Regular exercise, a healthy diet, rest, and social support may also be strongly recommended.  Postpartum psychosis is more serious and needs treatment right away. Hospitalization is often needed. HOME CARE  INSTRUCTIONS  Get as much rest as you can. Nap when the baby sleeps.   Exercise regularly. Some women find yoga and walking to be beneficial.   Eat a balanced and nourishing diet.   Do little things that you enjoy. Have a cup of tea, take a bubble bath, read your favorite magazine, or listen to your favorite music.  Avoid alcohol.   Ask for help with household chores, cooking, grocery shopping, or running errands as needed. Do not try to do everything.   Talk to people close to you about how you are feeling. Get support from your partner, family members, friends, or other new moms.  Try to stay positive in how you think. Think about the things you are grateful for.   Do not spend a lot of time alone.   Only take over-the-counter or prescription medicine as directed by your health care provider.  Keep all your postpartum appointments.   Let your health care provider know if you have any concerns.  SEEK MEDICAL CARE IF: You are having a reaction to or problems with your medicine. SEEK IMMEDIATE MEDICAL CARE IF:  You have suicidal feelings.   You think you may harm the baby or someone else. MAKE SURE YOU:  Understand these instructions.  Will watch your condition.  Will get help right away if you are not doing well or get worse. Document Released: 09/22/2003 Document Revised: 12/23/2012 Document Reviewed: 09/29/2012 ExitCare Patient Information 2015 [employer]. This information is not intended to replace advice given to you by your health care provider. Make sure you discuss any questions you have with your health care provider.  

## 2013-10-02 NOTE — Addendum Note (Signed)
Addended by: Christiana Pellant A on: 10/02/2013 10:46 AM   Modules accepted: Orders

## 2013-10-02 NOTE — Addendum Note (Signed)
Addended by: Samuel Germany on: 10/02/2013 10:25 AM   Modules accepted: Orders

## 2013-10-02 NOTE — Progress Notes (Signed)
  Subjective:     Toni Crawford is a 38 y.o. female who presents for a postpartum visit. She is 6 weeks postpartum following a spontaneous vaginal delivery. I have fully reviewed the prenatal and intrapartum course. The delivery was at 12 gestational weeks. Outcome: spontaneous vaginal delivery. Anesthesia: none. Postpartum course has been normal. Baby's course has been normal. Baby is feeding by breast. Bleeding no bleeding. Bowel function is normal. Bladder function is normal. Patient is not sexually active. Contraception method is Nexplanon. Postpartum depression screening: negative.  The following portions of the patient's history were reviewed and updated as appropriate: allergies, current medications, past family history, past medical history, past social history, past surgical history and problem list.  Review of Systems Pertinent items are noted in HPI.   Objective:    BP 108/74  Pulse 74  Temp(Src) 97.9 F (36.6 C) (Oral)  Ht 5\' 2"  (1.575 m)  Wt 114 lb 8 oz (51.937 kg)  BMI 20.94 kg/m2  Breastfeeding? Yes  General:  alert, cooperative and no distress  Lungs: clear to auscultation bilaterally  Heart:  regular rate and rhythm, S1, S2 normal, no murmur, click, rub or gallop  Abdomen: soft, non-tender; bowel sounds normal; no masses,  no organomegaly   Vulva:  not evaluated  Vagina: not evaluated        Assessment:     6 postpartum exam. Pap smear not done at today's visit.   Plan:    1. Contraception: Nexplanon 2. Follow up in: 1 year or as needed.   Nexplanon Insertion  Patient given informed consent, signed copy in the chart, time out was performed. Pregnancy test was negative. Appropriate time out taken.  Patient's left arm was prepped and draped in the usual sterile fashion.. The ruler used to measure and mark insertion area.  Pt was prepped with alcohol swab and then injected with 2 cc of 1% lidocaine with epinephrine.  Pt was prepped with betadine, Nexplanon removed  from packaging,  Device confirmed in needle, then inserted full length of needle and withdrawn per handbook instructions.  Pt insertion site covered with gauze and coban.   Minimal blood loss.  Pt tolerated the procedure well.

## 2013-10-03 LAB — GLUCOSE TOLERANCE, 2 HOURS
Glucose, 2 hour: 90 mg/dL (ref 70–139)
Glucose, Fasting: 82 mg/dL (ref 70–99)

## 2013-10-05 ENCOUNTER — Telehealth: Payer: Self-pay | Admitting: General Practice

## 2013-10-05 NOTE — Telephone Encounter (Signed)
Message copied by Shelly Coss on Mon Oct 05, 2013  1:49 PM ------      Message from: Truett Mainland      Created: Mon Oct 05, 2013 10:18 AM       2hr diabetes test normal.  Please let pt know. ------

## 2013-10-05 NOTE — Telephone Encounter (Signed)
Called patient and her sister answered stating she wasn't in but gave [place] number [phone] to reach patient. Called patient with pacific interpreter [phone], and informed patient of results. Patient verbalized understanding and had no other questions

## 2013-10-21 ENCOUNTER — Encounter: Payer: Self-pay | Admitting: *Deleted

## 2013-11-02 ENCOUNTER — Encounter: Payer: Self-pay | Admitting: Family Medicine

## 2013-11-25 ENCOUNTER — Encounter: Payer: Self-pay | Admitting: Obstetrics & Gynecology

## 2014-08-13 IMAGING — US US OB FOLLOW UP
1 series · 12 of 28 positions shown · non-contrast
Comparison: none

[Series 1: us ob follow up · 42 acquisitions, 12 frames shown]
[im 2/42]
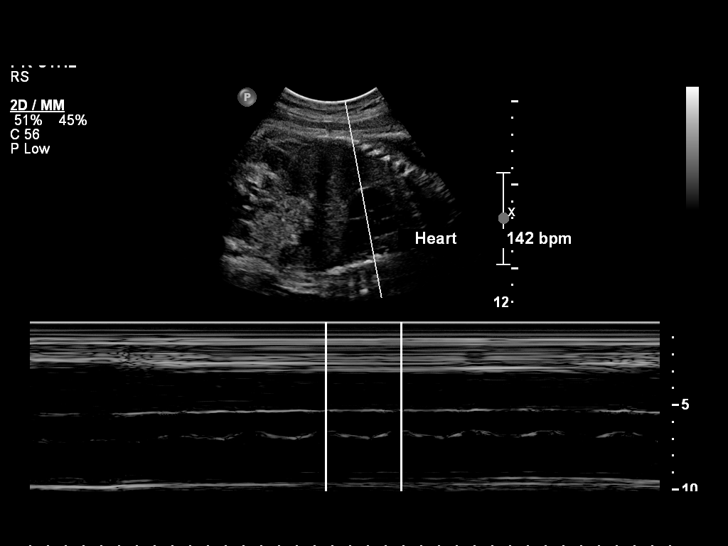
[im 5/42]
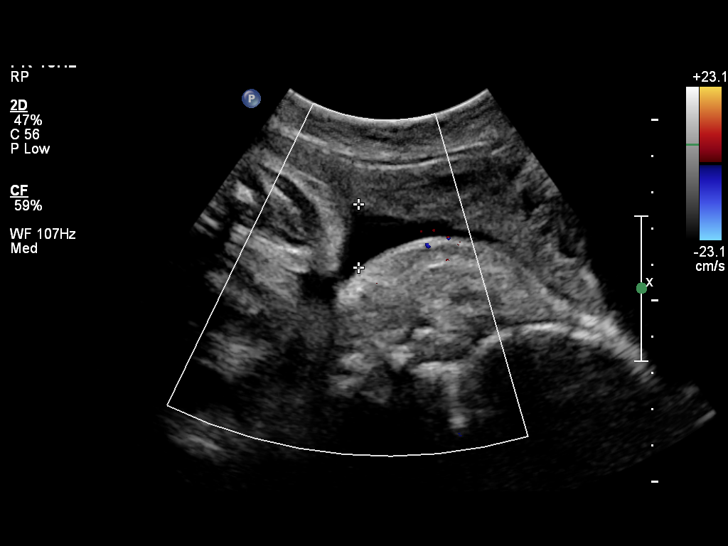
[im 8/42]
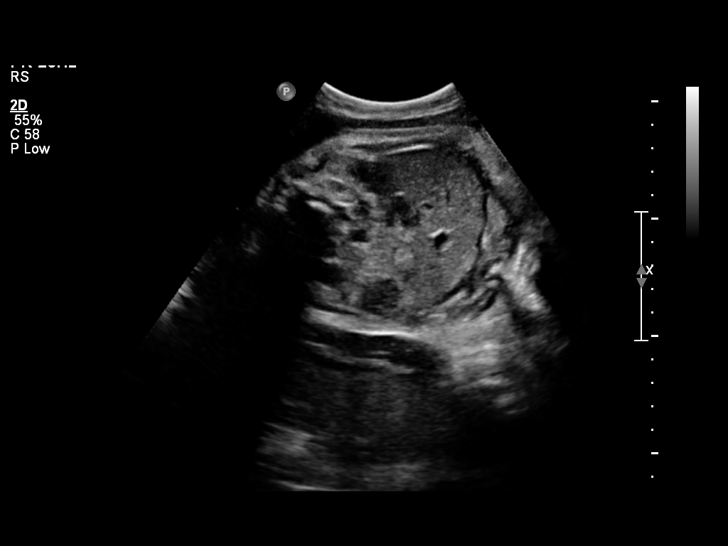
[im 13/42]
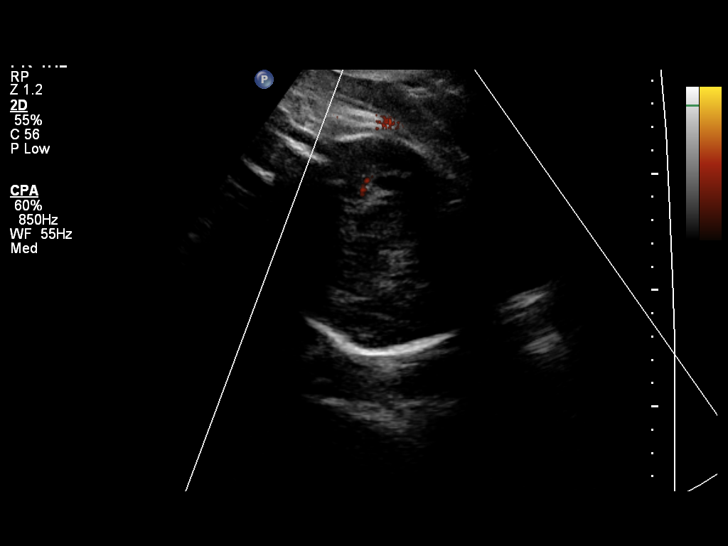
[im 16/42]
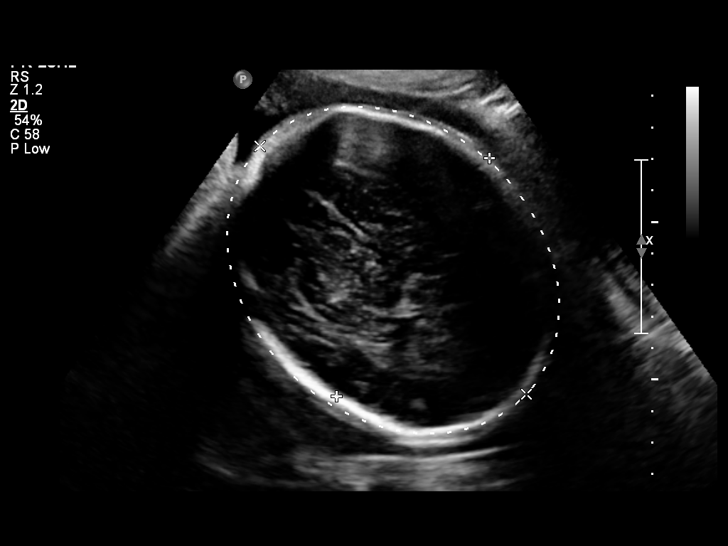
[im 19/42]
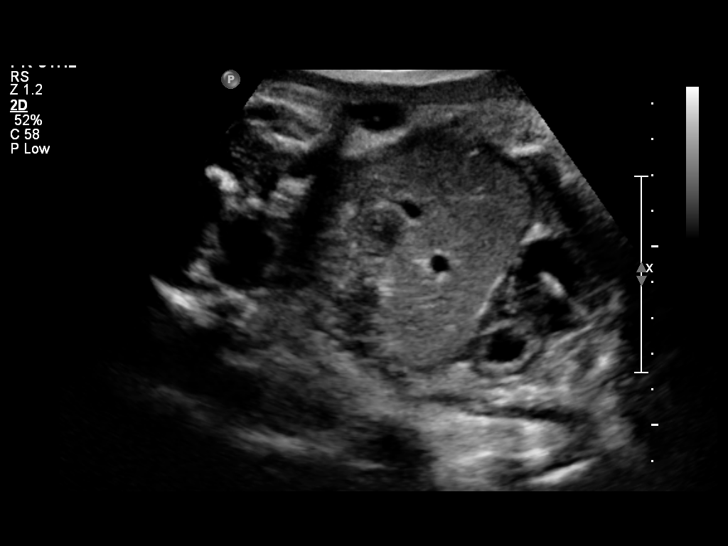
[im 23/42]
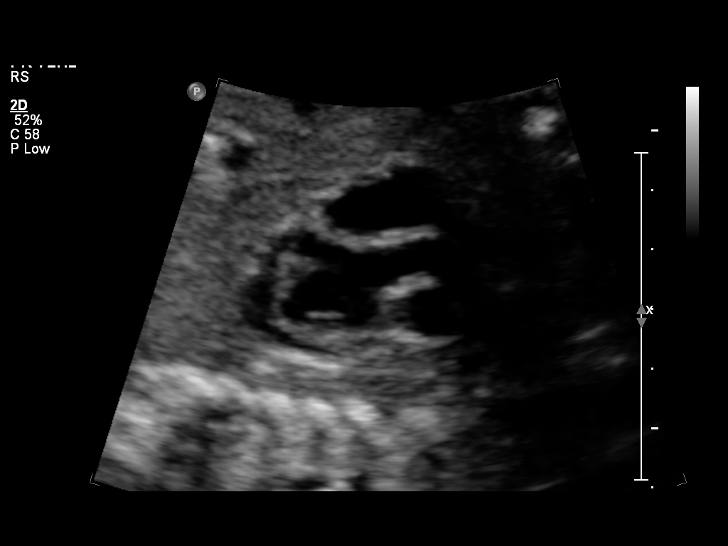
[im 26/42]
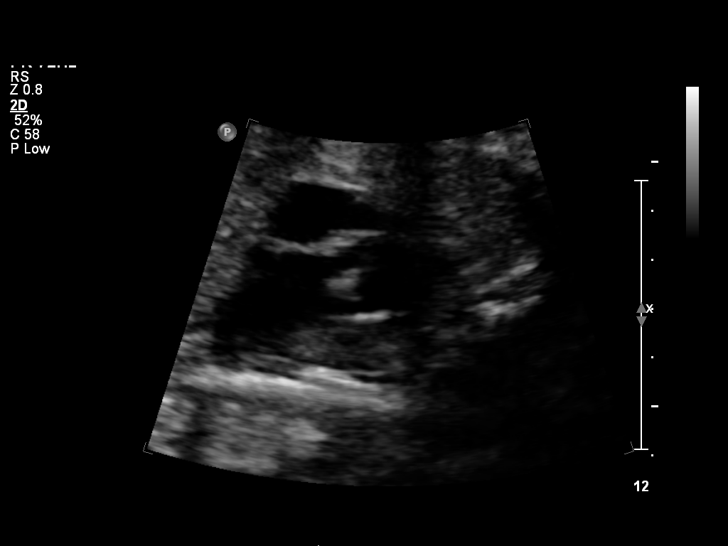
[im 29/42]
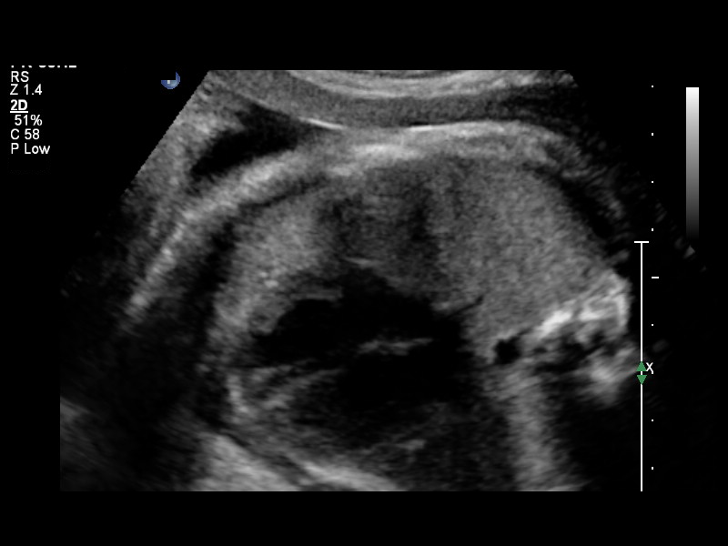
[im 34/42]
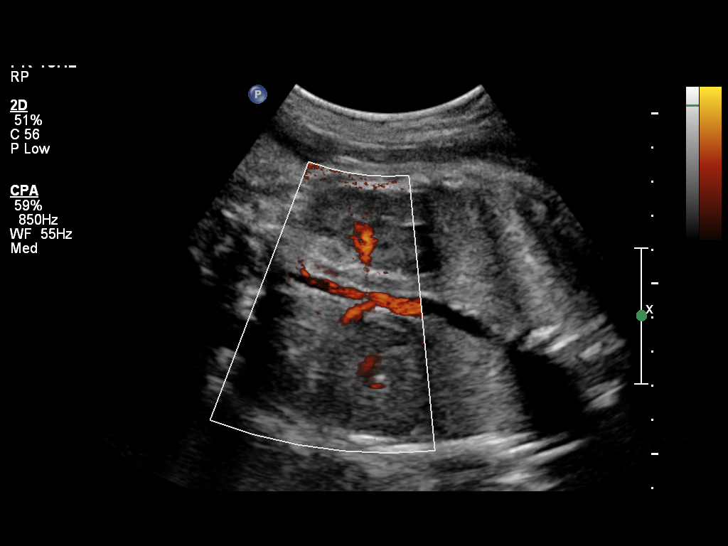
[im 37/42]
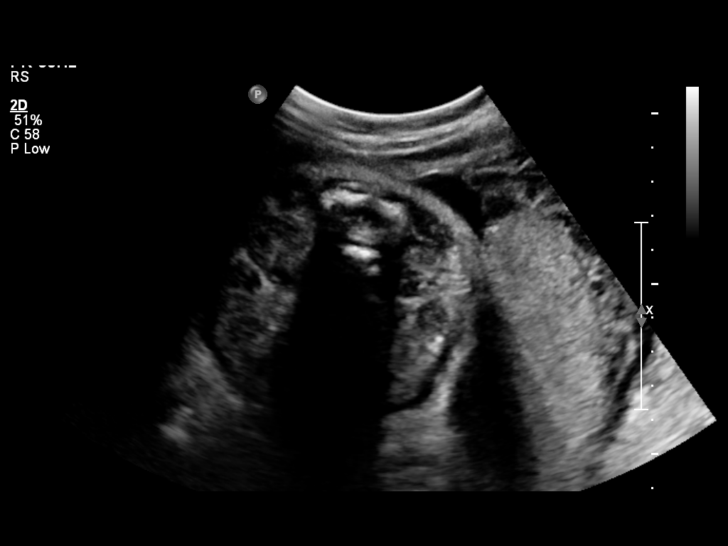
[im 40/42]
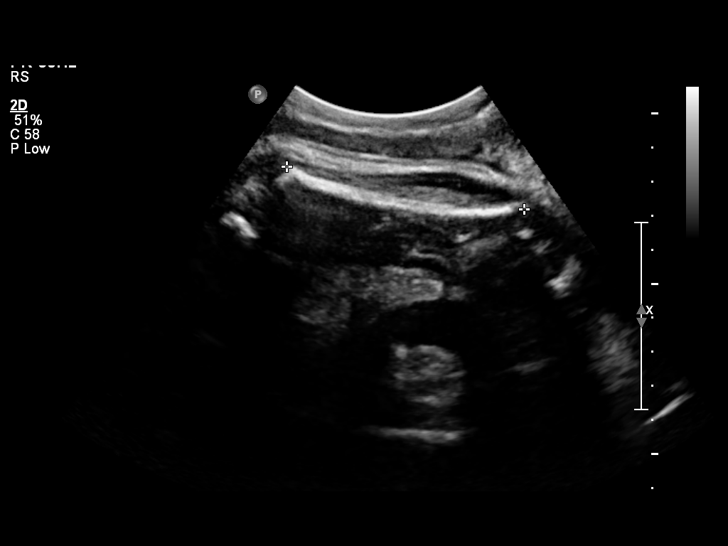

[12 of 28 positions shown; findings below may reference images not displayed]

OBSTETRICS REPORT
                      (Signed Final 08/06/2013 [DATE])

Service(s) Provided

 US OB FOLLOW UP                                       76816.1
Indications

 Advanced maternal age (37), Multigravida
 Diabetes - Gestational, A1 (diet controlled)
Fetal Evaluation

 Num Of Fetuses:    1
 Fetal Heart Rate:  142                          bpm
 Cardiac Activity:  Observed
 Presentation:      Cephalic
 Placenta:          Posterior, above cervical
                    os
 P. Cord            Visualized, central
 Insertion:

 Amniotic Fluid
 AFI FV:      Subjectively within normal limits
 AFI Sum:     10.84   cm       29  %Tile     Larg Pckt:    3.73  cm
 RUQ:   1.95    cm   RLQ:    3.42   cm    LUQ:   3.73    cm   LLQ:    1.74   cm
Biometry

 BPD:     87.4  mm     G. Age:  35w 2d                CI:        72.76   70 - 86
                                                      FL/HC:      21.5   20.8 -

 HC:     325.8  mm     G. Age:  37w 0d       31  %    HC/AC:      1.01   0.92 -

 AC:     321.2  mm     G. Age:  36w 0d       47  %    FL/BPD:     80.3   71 - 87
 FL:      70.2  mm     G. Age:  36w 0d       33  %    FL/AC:      21.9   20 - 24

 Est. FW:    9568  gm      6 lb 4 oz     55  %
Gestational Age

 LMP:           36w 4d        Date:  11/23/12                 EDD:   08/30/13
 U/S Today:     36w 1d                                        EDD:   09/02/13
 Best:          36w 4d     Det. By:  LMP  (11/23/12)          EDD:   08/30/13
Anatomy

 Cranium:          Appears normal         Aortic Arch:      Previously seen
 Fetal Cavum:      Previously seen        Ductal Arch:      Prev. seen in Color
 Ventricles:       Appears normal         Diaphragm:        Appears normal
 Choroid Plexus:   Previously seen        Stomach:          Appears normal
 Cerebellum:       Previously seen        Abdomen:          Appears normal
 Posterior Fossa:  Previously seen        Abdominal Wall:   Previously seen
 Nuchal Fold:      Not applicable (>20    Cord Vessels:     Previously seen
                   wks GA)
 Face:             Orbits and profile     Kidneys:          Appear normal
                   previously seen
 Lips:             Previously seen        Bladder:          Appears normal
 Palate:           Previously seen        Spine:            Previously seen
 Heart:            Appears normal         Lower             Previously seen
                   (4CH, axis, and        Extremities:
                   situs)
 RVOT:             Appears normal         Upper             Previously seen
                                          Extremities:
 LVOT:             Appears normal

 Other:  Fetus appears to be a female. Heels prev. visualized. Nasal bone
         prev. visualized.
Cervix Uterus Adnexa

 Cervix:       Not visualized (advanced GA >72wks)
Impression

 Single living intrauterine pregnancy at 36 weeks 4 days.
 Appropriate interval fetal growth (55%).
 Normal amniotic fluid volume.
 Normal interval fetal anatomy.
Recommendations

 Follow-up ultrasounds as clinically indicated.

 Thank you for sharing in the care of Ms. GIOVANNI GRAND with
 questions or concerns.
                [place], [place]

## 2016-06-06 ENCOUNTER — Other Ambulatory Visit: Payer: Self-pay | Admitting: Obstetrics & Gynecology

## 2016-06-06 DIAGNOSIS — Z1231 Encounter for screening mammogram for malignant neoplasm of breast: Secondary | ICD-10-CM

## 2018-01-15 ENCOUNTER — Other Ambulatory Visit (HOSPITAL_COMMUNITY): Payer: Self-pay | Admitting: *Deleted

## 2018-01-15 DIAGNOSIS — Z1231 Encounter for screening mammogram for malignant neoplasm of breast: Secondary | ICD-10-CM

## 2018-03-20 ENCOUNTER — Other Ambulatory Visit: Payer: Self-pay

## 2018-03-20 ENCOUNTER — Ambulatory Visit (HOSPITAL_COMMUNITY)
Admission: RE | Admit: 2018-03-20 | Discharge: 2018-03-20 | Disposition: A | Discharge status: Home / Self Care | Payer: Medicaid Other | Source: Ambulatory Visit | Attending: Obstetrics and Gynecology | Admitting: Obstetrics and Gynecology

## 2018-03-20 ENCOUNTER — Encounter (HOSPITAL_COMMUNITY): Payer: Self-pay

## 2018-03-20 ENCOUNTER — Ambulatory Visit (HOSPITAL_COMMUNITY): Payer: Self-pay

## 2018-03-20 ENCOUNTER — Ambulatory Visit
Admission: RE | Admit: 2018-03-20 | Discharge: 2018-03-20 | Disposition: A | Discharge status: Home / Self Care | Payer: No Typology Code available for payment source | Source: Ambulatory Visit | Attending: Obstetrics and Gynecology | Admitting: Obstetrics and Gynecology

## 2018-03-20 VITALS — BP 110/68 | Temp 98.8°F | Wt 131.0 lb

## 2018-03-20 DIAGNOSIS — Z1231 Encounter for screening mammogram for malignant neoplasm of breast: Secondary | ICD-10-CM

## 2018-03-20 DIAGNOSIS — Z1239 Encounter for other screening for malignant neoplasm of breast: Secondary | ICD-10-CM

## 2018-03-20 NOTE — Patient Instructions (Signed)
Explained breast self awareness with [name]. Patient did not need a Pap smear today due to last Pap smear was in November 2019 per patient. Let her know [facility] will cover Pap smears every 3 years unless has a history of abnormal Pap smears. Referred patient to the [facility] for a screening mammogram. Appointment scheduled for Thursday, March 20, 2018 at 1410. Patient aware of appointment and will be there. Let patient know the [facility] will follow up with her within the next couple weeks with results of mammogram by letter or phone. Toni Crawford verbalized understanding.  Toni Crawford, [name], RN 1:36 PM

## 2018-03-20 NOTE — Progress Notes (Addendum)
No complaints today.   Pap Smear: Pap smear not completed today. Last Pap smear was in November 2019 at the [facility] and normal per patient. Per patient has no history of an abnormal Pap smear. Last Pap smear result is not in Epic. Previous Pap smear and HPV result from 04/13/2013 is in [place].  Physical exam: Breasts Breasts symmetrical. No skin abnormalities bilateral breasts. No nipple retraction bilateral breasts. No nipple discharge bilateral breasts. No lymphadenopathy. No lumps palpated bilateral breasts. No complaints of pain or tenderness on exam. Referred patient to the [facility] for a screening mammogram. Appointment scheduled for Thursday, March 20, 2018 at 1410.        Pelvic/Bimanual No Pap smear completed today since last Pap smear was in November 2019 per patient. Pap smear not indicated per B[facility]CP guidelines.   Smoking History: Patient has never smoked.  Patient Navigation: Patient education provided. Access to services provided for patient through [facility] program. Patients sister Blima Ledger signed consent to interpret Bur[place]ese/Chin for patient.  Breast and Cervical Cancer Risk Assessment: Patient has no family history of breast cancer, known genetic mutations, or radiation treatment to the chest before age 83. Patient has no history of cervical dysplasia, immunocompromised, or DES exposure in-utero.  Risk Assessment    Risk Scores      03/20/2018   Last edited by: Armond Hang, LPN   5-year risk: 0.5 %   Lifetime risk: 9 %         Patients sister Sheria Lang Par signed consent to interpret for patient. Patient speaks Burmese/Chin.

## 2018-03-20 NOTE — Addendum Note (Signed)
Encounter addended by: Loletta Parish, RN on: 03/20/2018 2:03 PM  Actions taken: Clinical Note Signed

## 2018-04-17 ENCOUNTER — Encounter (HOSPITAL_COMMUNITY): Payer: Self-pay | Admitting: *Deleted

## 2021-01-04 ENCOUNTER — Encounter: Payer: Self-pay | Admitting: Internal Medicine

## 2021-01-12 ENCOUNTER — Other Ambulatory Visit: Payer: Self-pay | Admitting: Physician Assistant

## 2021-01-12 DIAGNOSIS — Z1231 Encounter for screening mammogram for malignant neoplasm of breast: Secondary | ICD-10-CM

## 2021-01-19 ENCOUNTER — Ambulatory Visit: Payer: Self-pay | Admitting: Gastroenterology

## 2021-01-23 ENCOUNTER — Encounter: Payer: Self-pay | Admitting: Internal Medicine

## 2021-01-23 ENCOUNTER — Ambulatory Visit (INDEPENDENT_AMBULATORY_CARE_PROVIDER_SITE_OTHER): Payer: Self-pay | Admitting: Internal Medicine

## 2021-01-23 ENCOUNTER — Other Ambulatory Visit (INDEPENDENT_AMBULATORY_CARE_PROVIDER_SITE_OTHER): Payer: Medicaid Other

## 2021-01-23 VITALS — BP 110/70 | HR 72 | Ht 62.0 in | Wt 121.5 lb

## 2021-01-23 DIAGNOSIS — D649 Anemia, unspecified: Secondary | ICD-10-CM | POA: Diagnosis not present

## 2021-01-23 DIAGNOSIS — R1013 Epigastric pain: Secondary | ICD-10-CM

## 2021-01-23 LAB — CBC WITH DIFFERENTIAL/PLATELET
Basophils Absolute: 0 10*3/uL (ref 0.0–0.1)
Basophils Relative: 0.9 % (ref 0.0–3.0)
Eosinophils Absolute: 0.1 10*3/uL (ref 0.0–0.7)
Eosinophils Relative: 1.5 % (ref 0.0–5.0)
HCT: 32.1 % — ABNORMAL LOW (ref 36.0–46.0)
Hemoglobin: 9.8 g/dL — ABNORMAL LOW (ref 12.0–15.0)
Lymphocytes Relative: 29.4 % (ref 12.0–46.0)
Lymphs Abs: 1.3 10*3/uL (ref 0.7–4.0)
MCHC: 30.5 g/dL (ref 30.0–36.0)
MCV: 77.4 fl — ABNORMAL LOW (ref 78.0–100.0)
Monocytes Absolute: 0.3 10*3/uL (ref 0.1–1.0)
Monocytes Relative: 7.6 % (ref 3.0–12.0)
Neutro Abs: 2.7 10*3/uL (ref 1.4–7.7)
Neutrophils Relative %: 60.6 % (ref 43.0–77.0)
Platelets: 261 10*3/uL (ref 150.0–400.0)
RBC: 4.15 Mil/uL (ref 3.87–5.11)
RDW: 32.3 % — ABNORMAL HIGH (ref 11.5–15.5)
WBC: 4.5 10*3/uL (ref 4.0–10.5)

## 2021-01-23 LAB — IBC + FERRITIN
Ferritin: 22.8 ng/mL (ref 10.0–291.0)
Iron: 167 ug/dL — ABNORMAL HIGH (ref 42–145)
Saturation Ratios: 31.5 % (ref 20.0–50.0)
TIBC: 530.6 ug/dL — ABNORMAL HIGH (ref 250.0–450.0)
Transferrin: 379 mg/dL — ABNORMAL HIGH (ref 212.0–360.0)

## 2021-01-23 NOTE — Patient Instructions (Signed)
Your provider has requested that you go to the basement level for lab work before leaving today. Press "B" on the elevator. The lab is located at the first door on the left as you exit the elevator.   You have been scheduled for an endoscopy. Please follow written instructions given to you at your visit today. If you use inhalers (even only as needed), please bring them with you on the day of your procedure.   If you are age 45 or older, your body mass index should be between 23-30. Your Body mass index is 22.22 kg/m. If this is out of the aforementioned range listed, please consider follow up with your Primary Care Provider.  If you are age 36 or younger, your body mass index should be between 19-25. Your Body mass index is 22.22 kg/m. If this is out of the aformentioned range listed, please consider follow up with your Primary Care Provider.   ________________________________________________________  The [facility] providers would like to encourage you to use [facility] to communicate with providers for non-urgent requests or questions.  Due to long hold times on the telephone, sending your provider a message by [facility] may be a faster and more efficient way to get a response.  Please allow 48 business hours for a response.  Please remember that this is for non-urgent requests.  _______________________________________________________   Due to recent changes in healthcare laws, you may see the results of your imaging and laboratory studies on MyChart before your provider has had a chance to review them.  We understand that in some cases there may be results that are confusing or concerning to you. Not all laboratory results come back in the same time frame and the provider may be waiting for multiple results in order to interpret others.  Please give [place] 48 hours in order for your provider to thoroughly review all the results before contacting the office for clarification of your results.    I  appreciate the  opportunity to care for you  Thank You   Sonny Masters Dorsey,MD

## 2021-01-23 NOTE — Progress Notes (Signed)
Chief Complaint: Epigastric ab pain, anemia  HPI : 46 year old female presents with epigastric ab pain and anemia  She has been having epigastric pain for longer than a year. Hot and spicy foods seem to make the pain worse. The pain is a burning sensation. The pain comes on depending on what she eats. Denies changes in diet. She has been taking iron supplements, which has been making her stools black. She has been told that her blood counts are low. She is still menstruating. Denies N&V, dysphagia. She has lost about 7-8 lbs since her GI symptoms started. She has 1 BM per day. Her stools are sticky. Denies prior EGD. Her father also had issues with stomach pain. Her sister had stomach cancer. She has reportedly been told that she has iron deficiency anemia based upon her labs.  Wt Readings from Last 3 Encounters:  01/23/21 121 lb 8 oz (55.1 kg)  03/20/18 131 lb (59.4 kg)  10/02/13 114 lb 8 oz (51.9 kg)   Past Medical History:  Diagnosis Date   Gestational diabetes    glyburide   Heart murmur    told with pregnancy--no symptoms    Past Surgical History:  Procedure Laterality Date   APPENDECTOMY     Family History  Problem Relation Age of Onset   Cancer Mother        uterus   Ovarian cancer Mother    Diabetes Father    Kidney disease Father    Diabetes Sister    Diabetes Sister    Diabetes Sister    Colon cancer Neg Hx    Social History   Tobacco Use   Smoking status: Never   Smokeless tobacco: Former    Types: Snuff, Chew  Vaping Use   Vaping Use: Never used  Substance Use Topics   Alcohol use: No   Drug use: No   Current Outpatient Medications  Medication Sig Dispense Refill   cholecalciferol (VITAMIN D3) 25 MCG (1000 UT) tablet Take 1,000 Units by mouth daily.     Ferrous Sulfate (IRON) 325 (65 Fe) MG TABS Take 1 tablet by mouth 2 (two) times daily.     No current facility-administered medications for this visit.   No Known Allergies   Review of  Systems: All systems reviewed and negative except where noted in HPI.   Physical Exam: BP 110/70    Pulse 72    Ht 5\' 2"  (1.575 m)    Wt 121 lb 8 oz (55.1 kg)    SpO2 100%    BMI 22.22 kg/m  Constitutional: Pleasant,well-developed, female in no acute distress. HEENT: Normocephalic and atraumatic. Conjunctivae are normal. No scleral icterus. Cardiovascular: Normal rate, regular rhythm.  Pulmonary/chest: Effort normal and breath sounds normal. No wheezing, rales or rhonchi. Abdominal: Soft, nondistended, nontender. Bowel sounds active throughout. There are no masses palpable. No hepatomegaly. Extremities: No edema Neurological: Alert and oriented to person place and time. Skin: Skin is warm and dry. No rashes noted. Psychiatric: Normal mood and affect. Behavior is normal.  No recent labs, imaging, or endoscopies available for review.  ASSESSMENT AND PLAN:  Epigastric ab pain IDA Patient presents with longstanding epigastric ab pain of unclear etiology. Etiologies could include PUD, gastritis/duodenitis, or GERD. Patient was also noted to have IDA, though she is also a menstruating female so anemia could be coming from menstrual periods. Will plan for further evaluation with EGD.  - Check CBC, ferritin, iron/TIBC. Labs are consistent with IDA. Hb is  low at 9.8 and ferritin is low at 22.8. Will ask the patient to start iron supplementation - Plan for EGD [facility]  Christia Reading, MD

## 2021-02-21 ENCOUNTER — Encounter: Payer: Self-pay | Admitting: Internal Medicine

## 2021-02-21 ENCOUNTER — Ambulatory Visit (AMBULATORY_SURGERY_CENTER): Payer: Medicaid Other | Admitting: Internal Medicine

## 2021-02-21 VITALS — BP 110/74 | HR 63 | Temp 98.4°F | Resp 13 | Ht 62.0 in | Wt 121.0 lb

## 2021-02-21 DIAGNOSIS — K2980 Duodenitis without bleeding: Secondary | ICD-10-CM

## 2021-02-21 DIAGNOSIS — R1013 Epigastric pain: Secondary | ICD-10-CM

## 2021-02-21 DIAGNOSIS — K319 Disease of stomach and duodenum, unspecified: Secondary | ICD-10-CM

## 2021-02-21 DIAGNOSIS — D132 Benign neoplasm of duodenum: Secondary | ICD-10-CM | POA: Diagnosis not present

## 2021-02-21 DIAGNOSIS — K317 Polyp of stomach and duodenum: Secondary | ICD-10-CM | POA: Diagnosis not present

## 2021-02-21 MED ORDER — OMEPRAZOLE 40 MG PO CPDR: 40.0000 mg | DELAYED_RELEASE_CAPSULE | Freq: Two times a day (BID) | ORAL | 0 refills | Status: DC

## 2021-02-21 MED ORDER — SODIUM CHLORIDE 0.9 % IV SOLN: 500.0000 mL | Freq: Once | INTRAVENOUS | Status: DC

## 2021-02-21 NOTE — Progress Notes (Signed)
VS-AG  Interpreter used today at the [facility] for this pt.  During the admission process: Interpreter's name isDorene Sorrow [phone] using the interpreter Ipad.

## 2021-02-21 NOTE — Progress Notes (Signed)
Report to PACU, RN, vss, BBS= Clear.  

## 2021-02-21 NOTE — Op Note (Signed)
[place] Patient Name: Toni Crawford Procedure Date: 02/21/2021 2:34 PM MRN: [id number] Endoscopist: Sonny Masters "Christia Reading ,  Age: 46 Referring MD:  Date of Birth: 25-Apr-1975 Gender: Female Account #: [id number] Procedure:                Upper GI endoscopy Indications:              Epigastric abdominal pain Medicines:                Monitored Anesthesia Care Procedure:                Pre-Anesthesia Assessment:                           - Prior to the procedure, a History and Physical                            was performed, and patient medications and                            allergies were reviewed. The patient's tolerance of                            previous anesthesia was also reviewed. The risks                            and benefits of the procedure and the sedation                            options and risks were discussed with the patient.                            All questions were answered, and informed consent                            was obtained. Prior Anticoagulants: The patient has                            taken no previous anticoagulant or antiplatelet                            agents. ASA Grade Assessment: II - A patient with                            mild systemic disease. After reviewing the risks                            and benefits, the patient was deemed in                            satisfactory condition to undergo the procedure.                           After obtaining informed consent, the endoscope was  passed under direct vision. Throughout the                            procedure, the patient's blood pressure, pulse, and                            oxygen saturations were monitored continuously. The                            GIF [id number] #[id number] was introduced through the                            mouth, and advanced to the second part of duodenum.                            The upper GI endoscopy was  accomplished without                            difficulty. The patient tolerated the procedure                            well. Scope In: Scope Out: Findings:                 The examined esophagus was normal. Biopsies were                            taken with a cold forceps for histology.                           Localized erythematous mucosa without bleeding was                            found in the gastric fundus and in the gastric                            antrum. Biopsies were taken with a cold forceps for                            histology.                           A single 4 mm sessile polyp with no bleeding was                            found in the duodenal bulb. The polyp was removed                            with a cold snare. Resection and retrieval were                            complete.                           Biopsies were taken with a cold  forceps in the                            duodenal bulb and in the second portion of the                            duodenum for histology. Complications:            No immediate complications. Estimated Blood Loss:     Estimated blood loss was minimal. Impression:               - Normal esophagus. Biopsied.                           - Erythematous mucosa in the gastric fundus and                            antrum. Biopsied.                           - A single duodenal polyp. Resected and retrieved.                           - Biopsies were taken with a cold forceps for                            histology in the duodenal bulb and in the second                            portion of the duodenum. Recommendation:           - Discharge patient to home (with escort).                           - No clear sources of blood loss were seen on                            today's examination, but we will await final                            biopsies results.                           - Await pathology results.                            - Return to GI clinic in 1 month.                           - The findings and recommendations were discussed                            with the patient. Sonny Masters "Christia Reading,  02/21/2021 2:59:10 PM

## 2021-02-21 NOTE — Progress Notes (Signed)
Called to room to assist during endoscopic procedure.  Patient ID and intended procedure confirmed with present staff. Received instructions for my participation in the procedure from the performing physician.  

## 2021-02-21 NOTE — Progress Notes (Signed)
GASTROENTEROLOGY PROCEDURE H&P NOTE   Primary Care Physician: Patient, No Pcp Per (Inactive)    Reason for Procedure:   Epigastric ab pain, IDA  Plan:    EGD  Patient is appropriate for endoscopic procedure(s) in the ambulatory ([facility]) setting.  The nature of the procedure, as well as the risks, benefits, and alternatives were carefully and thoroughly reviewed with the patient. Ample time for discussion and questions allowed. The patient understood, was satisfied, and agreed to proceed.     HPI: Toni Crawford is a 46 y.o. female who presents for EGD for evaluation of epigastric ab pain and IDA .  Patient was most recently seen in the Gastroenterology Clinic on 01/23/21.  No interval change in medical history since that appointment. Please refer to that note for full details regarding GI history and clinical presentation.   Past Medical History:  Diagnosis Date   Gestational diabetes    glyburide   Heart murmur    told with pregnancy--no symptoms   Tuberculosis    "when I was young" states she received treatment    Past Surgical History:  Procedure Laterality Date   APPENDECTOMY     UPPER GASTROINTESTINAL ENDOSCOPY      Prior to Admission medications   Medication Sig Start Date End Date Taking? Authorizing Provider  cholecalciferol (VITAMIN D3) 25 MCG (1000 UT) tablet Take 1,000 Units by mouth daily.   Yes [provider]  Ferrous Sulfate (IRON) 325 (65 Fe) MG TABS Take 1 tablet by mouth 2 (two) times daily.   Yes [provider]  omeprazole (PRILOSEC) 20 MG capsule  01/26/21   [provider]    Current Outpatient Medications  Medication Sig Dispense Refill   cholecalciferol (VITAMIN D3) 25 MCG (1000 UT) tablet Take 1,000 Units by mouth daily.     Ferrous Sulfate (IRON) 325 (65 Fe) MG TABS Take 1 tablet by mouth 2 (two) times daily.     omeprazole (PRILOSEC) 20 MG capsule  (Patient not taking: Reported on 02/21/2021)     Current  Facility-Administered Medications  Medication Dose Route Frequency Provider Last Rate Last Admin   0.9 %  sodium chloride infusion  500 mL Intravenous Once Sharyn Creamer, MD        Allergies as of 02/21/2021   (No Known Allergies)    Family History  Problem Relation Age of Onset   Cancer Mother        uterus   Ovarian cancer Mother    Diabetes Father    Kidney disease Father    Colon cancer Sister 17   Diabetes Sister    Diabetes Sister    Diabetes Sister    Esophageal cancer Neg Hx    Rectal cancer Neg Hx    Stomach cancer Neg Hx     Social History   Socioeconomic History   Marital status: Married    Spouse name: Not on file   Number of children: 4   Years of education: Not on file   Highest education level: 10th grade  Occupational History   Not on file  Tobacco Use   Smoking status: Never   Smokeless tobacco: Former    Types: Snuff, Chew  Vaping Use   Vaping Use: Never used  Substance and Sexual Activity   Alcohol use: No   Drug use: No   Sexual activity: Yes    Birth control/protection: None  Other Topics Concern   Not on file  Social History Narrative  Not on file   Social Determinants of Health   Financial Resource Strain: Not on file  Food Insecurity: Not on file  Transportation Needs: Not on file  Physical Activity: Not on file  Stress: Not on file  Social Connections: Not on file  Intimate Partner Violence: Not on file    Physical Exam: Vital signs in last 24 hours: BP 120/80    Pulse 64    Temp 98.4 F (36.9 C) (Temporal)    Ht 5\' 2"  (1.575 m)    Wt 121 lb (54.9 kg)    LMP 02/20/2021 (Exact Date)    SpO2 99%    BMI 22.13 kg/m  GEN: NAD EYE: Sclerae anicteric ENT: MMM CV: Non-tachycardic Pulm: No increased WOB GI: Soft NEURO:  Alert & Oriented   Christia Reading, MD [facility]   02/21/2021 2:26 PM

## 2021-02-21 NOTE — Progress Notes (Signed)
Pt in PACU with [place] interpreter via stick device.  Interpreter [phone] discuss all procedures and pt discharge instructions and pt verb understanding.

## 2021-02-21 NOTE — Patient Instructions (Addendum)
Office visit with Dr Lorenso Courier Monday 03/27/21 at 3 pm(arrive at 240 pm)  Start omeprazole twice daily 30 minutes before a meal and stay on it consistently until the office visit. If you feel you have any side effects from it call Dr Libby Maw office or send a mychart message if it is not urgent.  YOU HAD AN ENDOSCOPIC PROCEDURE TODAY AT [facility]:   Refer to the procedure report that was given to you for any specific questions about what was found during the examination.  If the procedure report does not answer your questions, please call your gastroenterologist to clarify.  If you requested that your care partner not be given the details of your procedure findings, then the procedure report has been included in a sealed envelope for you to review at your convenience later.  YOU SHOULD EXPECT: Some feelings of bloating in the abdomen. Passage of more gas than usual.  Walking can help get rid of the air that was put into your GI tract during the procedure and reduce the bloating. If you had a lower endoscopy (such as a colonoscopy or flexible sigmoidoscopy) you may notice spotting of blood in your stool or on the toilet paper. If you underwent a bowel prep for your procedure, you may not have a normal bowel movement for a few days.  Please Note:  You might notice some irritation and congestion in your nose or some drainage.  This is from the oxygen used during your procedure.  There is no need for concern and it should clear up in a day or so.  SYMPTOMS TO REPORT IMMEDIATELY:  Following upper endoscopy (EGD)  Vomiting of blood or coffee ground material  New chest pain or pain under the shoulder blades  Painful or persistently difficult swallowing  New shortness of breath  Fever of 100F or higher  Black, tarry-looking stools  For urgent or emergent issues, a gastroenterologist can be reached at any hour by calling [phone]. Do not use MyChart messaging for urgent concerns.     DIET:  We do recommend a small meal at first, but then you may proceed to your regular diet.  Drink plenty of fluids but you should avoid alcoholic beverages for 24 hours.  ACTIVITY:  You should plan to take it easy for the rest of today and you should NOT DRIVE or use heavy machinery until tomorrow (because of the sedation medicines used during the test).    FOLLOW UP: Our staff will call the number listed on your records 48-72 hours following your procedure to check on you and address any questions or concerns that you may have regarding the information given to you following your procedure. If we do not reach you, we will leave a message.  We will attempt to reach you two times.  During this call, we will ask if you have developed any symptoms of COVID 19. If you develop any symptoms (ie: fever, flu-like symptoms, shortness of breath, cough etc.) before then, please call [phone].  If you test positive for Covid 19 in the 2 weeks post procedure, please call and report this information to [place].    If any biopsies were taken you will be contacted by phone or by letter within the next 1-3 weeks.  Please call us at [phone] if you have not heard about the biopsies in 3 weeks.    SIGNATURES/CONFIDENTIALITY: You and/or your care partner have signed paperwork which will be entered into your  electronic medical record.  These signatures attest to the fact that that the information above on your After Visit Summary has been reviewed and is understood.  Full responsibility of the confidentiality of this discharge information lies with you and/or your care-partner.

## 2021-02-23 ENCOUNTER — Telehealth: Payer: Self-pay | Admitting: *Deleted

## 2021-02-23 NOTE — Telephone Encounter (Signed)
First attempt, left VM.  

## 2021-02-24 ENCOUNTER — Encounter: Payer: Self-pay | Admitting: Internal Medicine

## 2021-03-15 ENCOUNTER — Ambulatory Visit
Admission: RE | Admit: 2021-03-15 | Discharge: 2021-03-15 | Disposition: A | Discharge status: Home / Self Care | Payer: Medicaid Other | Source: Ambulatory Visit | Attending: Physician Assistant | Admitting: Physician Assistant

## 2021-03-15 DIAGNOSIS — Z1231 Encounter for screening mammogram for malignant neoplasm of breast: Secondary | ICD-10-CM

## 2021-03-16 ENCOUNTER — Other Ambulatory Visit: Payer: Self-pay | Admitting: Physician Assistant

## 2021-03-27 ENCOUNTER — Ambulatory Visit (INDEPENDENT_AMBULATORY_CARE_PROVIDER_SITE_OTHER): Payer: Medicaid Other | Admitting: Internal Medicine

## 2021-03-27 ENCOUNTER — Encounter: Payer: Self-pay | Admitting: Internal Medicine

## 2021-03-27 ENCOUNTER — Other Ambulatory Visit (INDEPENDENT_AMBULATORY_CARE_PROVIDER_SITE_OTHER): Payer: Medicaid Other

## 2021-03-27 VITALS — BP 104/78 | HR 83 | Ht 62.0 in | Wt 124.1 lb

## 2021-03-27 DIAGNOSIS — D509 Iron deficiency anemia, unspecified: Secondary | ICD-10-CM

## 2021-03-27 DIAGNOSIS — R109 Unspecified abdominal pain: Secondary | ICD-10-CM

## 2021-03-27 LAB — CBC WITH DIFFERENTIAL/PLATELET
Basophils Absolute: 0 10*3/uL (ref 0.0–0.1)
Basophils Relative: 0.6 % (ref 0.0–3.0)
Eosinophils Absolute: 0.1 10*3/uL (ref 0.0–0.7)
Eosinophils Relative: 1.4 % (ref 0.0–5.0)
HCT: 39.6 % (ref 36.0–46.0)
Hemoglobin: 13.2 g/dL (ref 12.0–15.0)
Lymphocytes Relative: 28 % (ref 12.0–46.0)
Lymphs Abs: 1.4 10*3/uL (ref 0.7–4.0)
MCHC: 33.4 g/dL (ref 30.0–36.0)
MCV: 87.6 fl (ref 78.0–100.0)
Monocytes Absolute: 0.4 10*3/uL (ref 0.1–1.0)
Monocytes Relative: 6.8 % (ref 3.0–12.0)
Neutro Abs: 3.3 10*3/uL (ref 1.4–7.7)
Neutrophils Relative %: 63.2 % (ref 43.0–77.0)
Platelets: 202 10*3/uL (ref 150.0–400.0)
RBC: 4.52 Mil/uL (ref 3.87–5.11)
RDW: 19.7 % — ABNORMAL HIGH (ref 11.5–15.5)
WBC: 5.2 10*3/uL (ref 4.0–10.5)

## 2021-03-27 NOTE — Progress Notes (Signed)
? ?Chief Complaint: Epigastric ab pain, anemia ? ?HPI : 46 year old female presents with epigastric ab pain and anemia ? ?Interval History: She has been doing about the same since I last saw her in clinic. She feels like her stools are small. She did do a trial of omeprazole therapy but does not think that this helped significantly.  Her epigastric ab pain has resolved. She poops 3 times per day. Most of the time the stools are small pieces, but sometimes they are a normal quantity. She does have to strain when she has a BM. She does not feel like she fully clears out her colon when she has a BM. She drinks at least 3 water bottles per day. Patient does have heavy periods. She has had 5 children in the past. She occasionally takes aluminum hydroxide to help with her abdominal discomfort ? ?Wt Readings from Last 3 Encounters:  ?03/27/21 124 lb 2 oz (56.3 kg)  ?02/21/21 121 lb (54.9 kg)  ?01/23/21 121 lb 8 oz (55.1 kg)  ? ?Current Outpatient Medications  ?Medication Sig Dispense Refill  ? Ferrous Sulfate (IRON) 325 (65 Fe) MG TABS Take 2 tablets by mouth daily in the afternoon.    ? cholecalciferol (VITAMIN D3) 25 MCG (1000 UT) tablet Take 1,000 Units by mouth daily. (Patient not taking: Reported on 03/27/2021)    ? ?No current facility-administered medications for this visit.  ? ?Review of Systems: ?All systems reviewed and negative except where noted in HPI.  ? ?Physical Exam: ?BP 104/78   Pulse 83   Ht '5\' 2"'$  (1.575 m)   Wt 124 lb 2 oz (56.3 kg)   BMI 22.70 kg/m?  ?Constitutional: Pleasant,well-developed, female in no acute distress. ?HEENT: Normocephalic and atraumatic. Conjunctivae are normal. No scleral icterus. ?Cardiovascular: Normal rate, regular rhythm.  ?Pulmonary/chest: Effort normal and breath sounds normal. No wheezing, rales or rhonchi. ?Abdominal: Soft, nondistended, nontender. Bowel sounds active throughout. There are no masses palpable. No hepatomegaly. ?Extremities: No edema ?Neurological: Alert  and oriented to person place and time. ?Skin: Skin is warm and dry. No rashes noted. ?Psychiatric: Normal mood and affect. Behavior is normal. ? ?Labs 01/2021: CBC with low Hb of 9.8. Ferritin 22.8. ? ?EGD 02/21/21: ?- Normal esophagus. Biopsied. ?- Erythematous mucosa in the gastric fundus and antrum. Biopsied. ?- A single duodenal polyp. Resected and retrieved. ?- Biopsies were taken with a cold forceps for histology in the duodenal bulb and in the second portion of the duodenum. ?Path: ?1. Surgical [P], duodenal bulb, 2nd portion of duodenum, and distal duodenum ?- DUODENAL MUCOSA WITH NO SPECIFIC HISTOPATHOLOGIC CHANGES ?- NEGATIVE FOR INCREASED INTRAEPITHELIAL LYMPHOCYTES OR VILLOUS ARCHITECTURAL CHANGES ?2. Surgical [P], duodenum, polyp (1) ?- POLYPOID DUODENAL MUCOSA SHOWING NONSPECIFIC INFLAMMATION AND ARCHITECTURAL CHANGES ?- NEGATIVE FOR DYSPLASIA OR MALIGNANCY ?3. Surgical [P], gastric antrum and gastric body ?- GASTRIC ANTRAL MUCOSA WITH MILD NONSPECIFIC REACTIVE GASTROPATHY ?- GASTRIC OXYNTIC MUCOSA WITH NO SPECIFIC HISTOPATHOLOGIC CHANGES ?- HELICOBACTER PYLORI-LIKE ORGANISMS ARE NOT IDENTIFIED ON ROUTINE H&E STAIN ?4. Surgical [P], esophageal biopsy ?- ESOPHAGEAL SQUAMOUS MUCOSA WITH NO SPECIFIC HISTOPATHOLOGIC CHANGES ?- NEGATIVE FOR INCREASED INTRAEPITHELIAL EOSINOPHILS ? ?ASSESSMENT AND PLAN: ? ?Abdominal discomfort ?IDA ?Patient presents for follow up. Her epigastric ab pain has overall improved, but she does still intermittent have some abdominal discomfort. Since her EGD was unremarkable, her ab discomfort issues may be related to underlying constipation. Will have the patient institute some strategies to help with constipation to see if this helps with her abdominal  discomfort.  ?- Check CBC, ferritin, iron/TIBC ?- Drink 8 cups of water and walk 30 min per day ?- Start daily fiber supplement ?- Start daily Miralax ?- RTC in 3 months. Will need screening colonoscopy or stool test in  07/2021 ? ?Christia Reading, MD ? ?I spent 40 minutes of time, including in depth chart review, independent review of results as outlined above, communicating results with the patient directly, face-to-face time with the patient, coordinating care, ordering studies and medications as appropriate, and documentation. ? ?

## 2021-03-27 NOTE — Patient Instructions (Addendum)
Your provider has requested that you go to the basement level for lab work before leaving today. Press "B" on the elevator. The lab is located at the first door on the left as you exit the elevator.  ? ?Use daily fiber such as Benefiber or Metamucil  ? ?Start daily Miralax 1 capful daily ? ?Drink 8 cups of water per day ? ?Walk 30 minutes per day ? ?If you are age 45 or older, your body mass index should be between 23-30. Your Body mass index is 22.7 kg/m?Marland Kitchen If this is out of the aforementioned range listed, please consider follow up with your Primary Care Provider. ? ?If you are age 46 or younger, your body mass index should be between 19-25. Your Body mass index is 22.7 kg/m?Marland Kitchen If this is out of the aformentioned range listed, please consider follow up with your Primary Care Provider.  ? ?________________________________________________________ ? ?The [facility] providers would like to encourage you to use [facility] to communicate with providers for non-urgent requests or questions.  Due to long hold times on the telephone, sending your provider a message by [facility] may be a faster and more efficient way to get a response.  Please allow 48 business hours for a response.  Please remember that this is for non-urgent requests.  ?_______________________________________________________  ? ?I appreciate the  opportunity to care for you ? ?Thank You  ? ?Sonny Masters Dorsey,MD ?

## 2021-03-28 LAB — IBC + FERRITIN
Ferritin: 18.8 ng/mL (ref 10.0–291.0)
Iron: 64 ug/dL (ref 42–145)
Saturation Ratios: 13.9 % — ABNORMAL LOW (ref 20.0–50.0)
TIBC: 460.6 ug/dL — ABNORMAL HIGH (ref 250.0–450.0)
Transferrin: 329 mg/dL (ref 212.0–360.0)

## 2021-03-28 NOTE — Progress Notes (Signed)
Hi Ammie, please let the patient know that her blood counts look a lot better and are now normal. Her iron levels are still slightly low. I would recommend that she continue to take at least a daily multivitamin that contains iron. She does not necessarily have to take the ferrous sulfate 325 mg QD anymore. Thanks.

## 2021-03-31 ENCOUNTER — Ambulatory Visit
Admission: RE | Admit: 2021-03-31 | Discharge: 2021-03-31 | Disposition: A | Discharge status: Home / Self Care | Payer: Medicaid Other | Source: Ambulatory Visit | Attending: Physician Assistant | Admitting: Physician Assistant

## 2021-03-31 ENCOUNTER — Other Ambulatory Visit: Payer: Self-pay | Admitting: Physician Assistant

## 2021-03-31 DIAGNOSIS — R928 Other abnormal and inconclusive findings on diagnostic imaging of breast: Secondary | ICD-10-CM

## 2021-03-31 DIAGNOSIS — N6310 Unspecified lump in the right breast, unspecified quadrant: Secondary | ICD-10-CM

## 2021-04-11 ENCOUNTER — Ambulatory Visit
Admission: RE | Admit: 2021-04-11 | Discharge: 2021-04-11 | Disposition: A | Discharge status: Home / Self Care | Payer: Medicaid Other | Source: Ambulatory Visit | Attending: Physician Assistant | Admitting: Physician Assistant

## 2021-04-11 DIAGNOSIS — N6310 Unspecified lump in the right breast, unspecified quadrant: Secondary | ICD-10-CM

## 2021-04-28 ENCOUNTER — Ambulatory Visit: Payer: Self-pay | Admitting: Surgery

## 2021-04-28 DIAGNOSIS — D0511 Intraductal carcinoma in situ of right breast: Secondary | ICD-10-CM

## 2021-04-29 ENCOUNTER — Ambulatory Visit: Payer: Self-pay | Admitting: Surgery

## 2021-04-29 NOTE — H&P (Signed)
? ?Subjective  ? ?Chief Complaint: Breast Cancer ?  ? ? ?History of Present Illness: ?Toni Crawford is a 46 y.o. female who is seen today as an office consultation at the request of Dr. Ronnald Ramp for evaluation of Breast Cancer ?Marland Kitchen   ?This is a healthy 46 year old [place] female with no family history of breast cancer who presents after recent screening mammogram revealed some asymmetry in the retroareolar right breast.  She underwent further work-up which revealed an area of DCIS measuring 2.7 x 2.3 x 1.1 cm at 0300 1 cmfn.  She presents now to discuss surgical options. ? ?No previous breast surgery or breast problems. ? ?Review of Systems: ?A complete review of systems was obtained from the patient.  I have reviewed this information and discussed as appropriate with the patient.  See HPI as well for other ROS. ? ?Review of Systems  ?Constitutional: Negative.   ?HENT: Negative.   ?Eyes: Negative.   ?Respiratory: Negative.   ?Cardiovascular: Negative.   ?Gastrointestinal: Negative.   ?Genitourinary: Negative.   ?Musculoskeletal: Negative.   ?Skin: Negative.   ?Neurological: Negative.   ?Endo/Heme/Allergies: Negative.   ?Psychiatric/Behavioral: Negative.   ?  ? ? ?Medical History: ?Past Medical History:  ?Diagnosis Date  ? Diabetes mellitus without complication (CMS-HCC)   ? ? ?Patient Active Problem List  ?Diagnosis  ? Ductal carcinoma in situ (DCIS) of right breast  ? ? ?No past surgical history on file.  ? ?No Known Allergies ? ?Current Outpatient Medications on File Prior to Visit  ?Medication Sig Dispense Refill  ? ferrous sulfate 325 (65 FE) MG tablet Take by mouth    ? ?No current facility-administered medications on file prior to visit.  ? ? ?Family History  ?Problem Relation Age of Onset  ? High blood pressure (Hypertension) Sister   ? Diabetes Sister   ? High blood pressure (Hypertension) Brother   ? Diabetes Brother   ?  ? ?Social History  ? ?Tobacco Use  ?Smoking Status Never  ?Smokeless Tobacco Never  ?   ? ?Social History  ? ?Socioeconomic History  ? Marital status: Married  ?Tobacco Use  ? Smoking status: Never  ? Smokeless tobacco: Never  ?Vaping Use  ? Vaping Use: Never used  ?Substance and Sexual Activity  ? Alcohol use: Never  ? Drug use: Never  ? ? ?Objective:  ? ? ?Vitals:  ? 04/28/21 1127  ?BP: 112/80  ?Pulse: 86  ?Temp: 36.7 ?C (98 ?F)  ?SpO2: 98%  ?Weight: 57.8 kg (127 lb 8 oz)  ?Height: 157.5 cm ('5\' 2"'$ )  ?  ?Body mass index is 23.32 kg/m?. ? ?Physical Exam  ? ?Constitutional:  WDWN in NAD, conversant, no obvious deformities; lying in bed comfortably ?Eyes:  Pupils equal, round; sclera anicteric; moist conjunctiva; no lid lag ?HENT:  Oral mucosa moist; good dentition  ?Neck:  No masses palpated, trachea midline; no thyromegaly ?Lungs:  CTA bilaterally; normal respiratory effort ?Breasts:  symmetric, no nipple changes; no palpable masses or lymphadenopathy on either side ?CV:  Regular rate and rhythm; no murmurs; extremities well-perfused with no edema ?Abd:  +bowel sounds, soft, non-tender, no palpable organomegaly; no palpable hernias ?Musc:  Normal gait; no apparent clubbing or cyanosis in extremities ?Lymphatic:  No palpable cervical or axillary lymphadenopathy ?Skin:  Warm, dry; no sign of jaundice ?Psychiatric - alert and oriented x 4; calm mood and affect ? ? ?Labs, Imaging and Diagnostic Testing: ? ?Diagnosis ?Breast, right, needle core biopsy, 3 o'clock, retroareolar ?-  DUCTAL CARCINOMA IN-SITU (DCIS), GRADE I (LOW) AND FOCAL GRADE II (INTERMEDIATE), WITH NECROSIS, ?INVOLVING AN INTRADUCTAL PAPILLOMA. ?- MICROCALCIFICATIONS PRESENT. ?Microscopic Comment ?The atypical epithelial proliferation involving an intraductal papilloma, is most characteristic for DCIS (low-grade and focal intermediate grade) and there is focal intraluminal necrotic material. The epithelial proliferation is CK5/6 ?negative (characteristic for ADH/DCIS). The papilloma has fibrovascular cores lined by myoepithelial cell and  the ?periphery of the duct has a myoepithelial lining (Smooth Muscle Myosin-Heavy Chain positive myoepithelial cells). The ?immunohistochemical (IHC) stains (CK5/6, Smooth Muscle myosin-HC) have adequate controls. ?Dr. Thornell Sartorius has also viewed the H&E slides (Intradepartmental QA review) and concurs. ?[facility]) was telephoned a preliminary diagnosis (pending IHC stain results) on ?04/12/2021 by Dr. Spero Curb ?Maryan Puls MD ?Pathologist, Electronic Signature ?(Case signed 04/12/2021) ? ?PROGNOSTIC INDICATORS ?Results: ?IMMUNOHISTOCHEMICAL AND MORPHOMETRIC ANALYSIS PERFORMED MANUALLY ?Estrogen Receptor: 100%, POSITIVE, STRONG STAINING INTENSITY ?Progesterone Receptor: 15%, POSITIVE, STRONG STAINING INTENSITY ?REFERENCE RANGE ESTROGEN RECEPTOR ?NEGATIVE 0% ?POSITIVE =>1% ?REFERENCE RANGE PROGESTERONE RECEPTOR ?NEGATIVE 0% ?POSITIVE =>1% ?All controls stained appropriately ?Thressa Sheller MD ?Pathologist, Electronic Signature ?( Signed 04/21/2021) ? ?CLINICAL DATA:  Possible right breast asymmetry with calcifications ?in the slightly lower inner right breast on a recent screening ?mammogram. ?  ?EXAM: ?DIGITAL DIAGNOSTIC UNILATERAL RIGHT MAMMOGRAM WITH TOMOSYNTHESIS AND ?CAD; ULTRASOUND RIGHT BREAST LIMITED ?  ?TECHNIQUE: ?Right digital diagnostic mammography and breast tomosynthesis was ?performed. The images were evaluated with computer-aided detection.; ?Targeted ultrasound examination of the right breast was performed ?  ?COMPARISON:  Previous exam(s). ?  ?ACR Breast Density Category b: There are scattered areas of ?fibroglandular density. ?  ?FINDINGS: ?3D tomographic and 2D generated true lateral and spot compression ?images of the left breast were obtained as well as 2D spot ?magnification images. These demonstrate a developing asymmetry with ?developing calcifications spanning a 4.3 x 2.5 x 2.4 cm area ?extending posteriorly from the nipple into the posterior aspect of ?the slightly  right breast centered slightly medially and inferiorly. ?The calcifications vary in size, shape and density with no layering. ?  ?Targeted ultrasound is performed, showing a horizontally oriented, ?irregular, heterogeneous mass centered in the 3 o'clock position of ?the right breast, 1 cm from the nipple. This has anechoic, this has ?anechoic, hypoechoic and intermediate echogenicity components and ?contains multiple small calcifications. This corresponds to the ?mammographic asymmetry and calcifications. There is internal blood ?flow with power Doppler in a soft tissue component containing ?calcifications. This mass measures 2.7 x 2.3 x 1.1 cm. ?  ?Ultrasound of the right axilla demonstrated normal appearing right ?axillary lymph nodes. ?  ?IMPRESSION: ?2.7 cm indeterminate mass containing suspicious calcifications in ?the 3 o'clock position of the right breast. ?  ?RECOMMENDATION: ?Ultrasound-guided core needle biopsy of the 2.7 cm right breast ?mass. This has been discussed with the patient and scheduled at 1:45 ?p.m. on 04/11/2021. ?  ?I have discussed the findings and recommendations with the patient. ?If applicable, a reminder letter will be sent to the patient ?regarding the next appointment. ?  ?BI-RADS CATEGORY  4: Suspicious. ?  ?Electronically Signed: ?By: Claudie Revering M.D. ?On: 03/31/2021 16:56 ? ?Assessment and Plan:  ?Diagnoses and all orders for this visit: ? ?Ductal carcinoma in situ (DCIS) of right breast ?-     Ambulatory Referral to Oncology-Medical ?-     Ambulatory Referral to Radiation Oncology ? ?  ?I spent some time with the patient and her husband discussing her disease and the recommended treatment.  This seems to be Stage  0 breast cancer that can be cured with excision to clear margins, followed by radiation and probable anti-estrogens.  ? ?We discussed surgical options.  We will plan right breast radioactive seed localized lumpectomy.  The surgical procedure has been discussed with the  patient.  Potential risks, benefits, alternative treatments, and expected outcomes have been explained.  All of the patient's questions at this time have been answered.  The likelihood of reaching the patient's t

## 2021-05-02 ENCOUNTER — Telehealth: Payer: Self-pay | Admitting: Hematology and Oncology

## 2021-05-02 ENCOUNTER — Telehealth: Payer: Self-pay | Admitting: Radiation Oncology

## 2021-05-02 ENCOUNTER — Other Ambulatory Visit: Payer: Self-pay | Admitting: Surgery

## 2021-05-02 ENCOUNTER — Other Ambulatory Visit: Payer: Self-pay | Admitting: *Deleted

## 2021-05-02 DIAGNOSIS — D0511 Intraductal carcinoma in situ of right breast: Secondary | ICD-10-CM | POA: Insufficient documentation

## 2021-05-02 NOTE — Progress Notes (Incomplete)
[facility] ?CONSULT NOTE ? ?Patient Care Team: ?Theodoro Clock as PCP - General (Physician Assistant) ? ?CHIEF COMPLAINTS/PURPOSE OF CONSULTATION:  ?Newly diagnosed breast cancer ? ?HISTORY OF PRESENTING ILLNESS:  ?Toni Crawford 46 y.o. female is here because of recent diagnosis of {left/right:311354} breast cancer. She presents to the clinic today for a consult. ? ?I reviewed her records extensively and collaborated the history with the patient. ? ?SUMMARY OF ONCOLOGIC HISTORY: ?Oncology History  ? No history exists.  ? ? ? ?MEDICAL HISTORY:  ?Past Medical History:  ?Diagnosis Date  ? Gestational diabetes   ? glyburide  ? Heart murmur   ? told with pregnancy--no symptoms  ? Tuberculosis   ? "when I was young" states she received treatment  ? ? ?SURGICAL HISTORY: ?Past Surgical History:  ?Procedure Laterality Date  ? APPENDECTOMY    ? UPPER GASTROINTESTINAL ENDOSCOPY    ? ? ?SOCIAL HISTORY: ?Social History  ? ?Socioeconomic History  ? Marital status: Married  ?  Spouse name: Not on file  ? Number of children: 4  ? Years of education: Not on file  ? Highest education level: 10th grade  ?Occupational History  ? Not on file  ?Tobacco Use  ? Smoking status: Never  ? Smokeless tobacco: Former  ?  Types: Snuff, Chew  ?Vaping Use  ? Vaping Use: Never used  ?Substance and Sexual Activity  ? Alcohol use: No  ? Drug use: No  ? Sexual activity: Yes  ?  Birth control/protection: None  ?Other Topics Concern  ? Not on file  ?Social History Narrative  ? Not on file  ? ?Social Determinants of Health  ? ?Financial Resource Strain: Not on file  ?Food Insecurity: Not on file  ?Transportation Needs: Not on file  ?Physical Activity: Not on file  ?Stress: Not on file  ?Social Connections: Not on file  ?Intimate Partner Violence: Not on file  ? ? ?FAMILY HISTORY: ?Family History  ?Problem Relation Age of Onset  ? Cancer Mother   ?     uterus  ? Ovarian cancer Mother   ? Diabetes Father   ? Kidney disease Father   ?  Colon cancer Sister 74  ? Diabetes Sister   ? Diabetes Sister   ? Diabetes Sister   ? Esophageal cancer Neg Hx   ? Rectal cancer Neg Hx   ? Stomach cancer Neg Hx   ? ? ?ALLERGIES:  has No Known Allergies. ? ?MEDICATIONS:  ?Current Outpatient Medications  ?Medication Sig Dispense Refill  ? cholecalciferol (VITAMIN D3) 25 MCG (1000 UT) tablet Take 1,000 Units by mouth daily. (Patient not taking: Reported on 03/27/2021)    ? Ferrous Sulfate (IRON) 325 (65 Fe) MG TABS Take 2 tablets by mouth daily in the afternoon.    ? ?No current facility-administered medications for this visit.  ? ? ?REVIEW OF SYSTEMS:   ?Constitutional: Denies fevers, chills or abnormal night sweats ?Eyes: Denies blurriness of vision, double vision or watery eyes ?Ears, nose, mouth, throat, and face: Denies mucositis or sore throat ?Respiratory: Denies cough, dyspnea or wheezes ?Cardiovascular: Denies palpitation, chest discomfort or lower extremity swelling ?Gastrointestinal:  Denies nausea, heartburn or change in bowel habits ?Skin: Denies abnormal skin rashes ?Lymphatics: Denies new lymphadenopathy or easy bruising ?Neurological:Denies numbness, tingling or new weaknesses ?Behavioral/Psych: Mood is stable, no new changes  ?Breast: *** Denies any palpable lumps or discharge ?All other systems were reviewed with the patient and are negative. ? ?PHYSICAL EXAMINATION: ?  ECOG PERFORMANCE STATUS: {CHL ONC ECOG QG:[id number]} ? ?There were no vitals filed for this visit. ?There were no vitals filed for this visit. ? ?GENERAL:alert, no distress and comfortable ?SKIN: skin color, texture, turgor are normal, no rashes or significant lesions ?EYES: normal, conjunctiva are pink and non-injected, sclera clear ?OROPHARYNX:no exudate, no erythema and lips, buccal mucosa, and tongue normal  ?NECK: supple, thyroid normal size, non-tender, without nodularity ?LYMPH:  no palpable lymphadenopathy in the cervical, axillary or inguinal ?LUNGS: clear to auscultation and  percussion with normal breathing effort ?HEART: regular rate & rhythm and no murmurs and no lower extremity edema ?ABDOMEN:abdomen soft, non-tender and normal bowel sounds ?Musculoskeletal:no cyanosis of digits and no clubbing  ?PSYCH: alert & oriented x 3 with fluent speech ?NEURO: no focal motor/sensory deficits ?BREAST:*** No palpable nodules in breast. No palpable axillary or supraclavicular lymphadenopathy (exam performed in the presence of a chaperone)  ? ?LABORATORY DATA:  ?I have reviewed the data as listed ?Lab Results  ?Component Value Date  ? WBC 5.2 03/27/2021  ? HGB 13.2 03/27/2021  ? HCT 39.6 03/27/2021  ? MCV 87.6 03/27/2021  ? PLT 202.0 03/27/2021  ? ?No results found for: NA, K, CL, CO2 ? ?RADIOGRAPHIC STUDIES: ?I have personally reviewed the radiological reports and agreed with the findings in the report. ? ?ASSESSMENT AND PLAN:  ?No problem-specific Assessment & Plan notes found for this encounter. ? ? ?All questions were answered. The patient knows to call the clinic with any problems, questions or concerns. ?  ? Suzzette Righter, CMA ?05/02/21 ? I Gardiner Coins am scribing for Dr. Lindi Adie ? ?***  ?

## 2021-05-02 NOTE — Telephone Encounter (Signed)
Called patient to schedule a consultation w. Dr. Squire. No answer, LVM for a return call.  

## 2021-05-02 NOTE — Telephone Encounter (Signed)
Scheduled appt per 5/2 referral. Pt is aware of appt date and time. Pt is aware to arrive 15 mins prior to appt time and to bring and updated insurance card. Pt is aware of appt location.   ?

## 2021-05-03 ENCOUNTER — Other Ambulatory Visit: Payer: Self-pay | Admitting: Genetic Counselor

## 2021-05-03 ENCOUNTER — Other Ambulatory Visit: Payer: Self-pay

## 2021-05-03 ENCOUNTER — Inpatient Hospital Stay: Payer: Medicaid Other

## 2021-05-03 ENCOUNTER — Encounter: Payer: Self-pay | Admitting: Genetic Counselor

## 2021-05-03 ENCOUNTER — Inpatient Hospital Stay: Payer: Medicaid Other | Attending: Hematology and Oncology | Admitting: Genetic Counselor

## 2021-05-03 DIAGNOSIS — Z800 Family history of malignant neoplasm of digestive organs: Secondary | ICD-10-CM

## 2021-05-03 DIAGNOSIS — D0511 Intraductal carcinoma in situ of right breast: Secondary | ICD-10-CM

## 2021-05-03 DIAGNOSIS — Z8041 Family history of malignant neoplasm of ovary: Secondary | ICD-10-CM | POA: Diagnosis not present

## 2021-05-03 LAB — GENETIC SCREENING ORDER

## 2021-05-03 NOTE — Progress Notes (Signed)
Surgical Instructions ? ? ? Your procedure is scheduled on Tuesday, May 9th, 2023. ? ? Report to [facility] Main Entrance "A" at 11:15 A.M., then check in with the Admitting office. ? Call this number if you have problems the morning of surgery: ? [phone] ? ? If you have any questions prior to your surgery date call [phone]: Open Monday-Friday 8am-4pm ? ? ? Remember: ? Do not eat after midnight the night before your surgery ? ?You may drink clear liquids until 10:15 the morning of your surgery.   ?Clear liquids allowed are: Water, Non-Citrus Juices (without pulp), Carbonated Beverages, Clear Tea, Black Coffee ONLY (NO MILK, CREAM OR POWDERED CREAMER of any kind), and Gatorade ?  ? Take these medicines the morning of surgery with A SIP OF WATER: NONE ? ? ?As of today, STOP taking any Aspirin (unless otherwise instructed by your surgeon) Aleve, Naproxen, Ibuprofen, Motrin, Advil, Goody's, BC's, all herbal medications, fish oil, and all vitamins. ? ?WHAT DO I DO ABOUT MY DIABETES MEDICATION? ? ? ?Do not take oral diabetes medicines (pills) the morning of surgery. ? ?THE NIGHT BEFORE SURGERY, take ___________ units of ___________insulin.     ? ? ?THE MORNING OF SURGERY, take _____________ units of __________insulin. ? ?The day of surgery, do not take other diabetes injectables, including Byetta (exenatide), Bydureon (exenatide ER), Victoza (liraglutide), or Trulicity (dulaglutide). ? ?If your CBG is greater than 220 mg/dL, you may take ? of your sliding scale (correction) dose of insulin. ? ? ?HOW TO MANAGE YOUR DIABETES ?BEFORE AND AFTER SURGERY ? ?Why is it important to control my blood sugar before and after surgery? ?Improving blood sugar levels before and after surgery helps healing and can limit problems. ?A way of improving blood sugar control is eating a healthy diet by: ? Eating less sugar and carbohydrates ? Increasing activity/exercise ? Talking with your doctor about reaching your blood sugar  goals ?High blood sugars (greater than 180 mg/dL) can raise your risk of infections and slow your recovery, so you will need to focus on controlling your diabetes during the weeks before surgery. ?Make sure that the doctor who takes care of your diabetes knows about your planned surgery including the date and location. ? ?How do I manage my blood sugar before surgery? ?Check your blood sugar at least 4 times a day, starting 2 days before surgery, to make sure that the level is not too high or low. ? ?Check your blood sugar the morning of your surgery when you wake up and every 2 hours until you get to the Short Stay unit. ? ?If your blood sugar is less than 70 mg/dL, you will need to treat for low blood sugar: ?Do not take insulin. ?Treat a low blood sugar (less than 70 mg/dL) with ? cup of clear juice (cranberry or apple), 4 glucose tablets, OR glucose gel. ?Recheck blood sugar in 15 minutes after treatment (to make sure it is greater than 70 mg/dL). If your blood sugar is not greater than 70 mg/dL on recheck, call [phone] for further instructions. ?Report your blood sugar to the short stay nurse when you get to Short Stay. ? ?If you are admitted to the hospital after surgery: ?Your blood sugar will be checked by the staff and you will probably be given insulin after surgery (instead of oral diabetes medicines) to make sure you have good blood sugar levels. ?The goal for blood sugar control after surgery is 80-180 mg/dL.  ? ? ? The  day of surgery: ?         ?Do not wear jewelry or makeup ?Do not wear lotions, powders, perfumes, or deodorant. ?Do not shave 48 hours prior to surgery.   ?Do not bring valuables to the hospital. ?Do not wear nail polish, gel polish, artificial nails, or any other type of covering on natural nails (fingers and toes) ?If you have artificial nails or gel coating that need to be removed by a nail salon, please have this removed prior to surgery. Artificial nails or gel coating may  interfere with anesthesia's ability to adequately monitor your vital signs. ? ?[facility] is not responsible for any belongings or valuables. .  ? ?Do NOT Smoke (Tobacco/Vaping)  24 hours prior to your procedure ? ?If you use a CPAP at night, you may bring your mask for your overnight stay. ?  ?Contacts, glasses, hearing aids, dentures or partials may not be worn into surgery, please bring cases for these belongings ?  ?For patients admitted to the hospital, discharge time will be determined by your treatment team. ?  ?Patients discharged the day of surgery will not be allowed to drive home, and someone needs to stay with them for 24 hours. ? ? ?SURGICAL WAITING ROOM VISITATION ?Patients having surgery or a procedure in a hospital may have two support people. ?Children under the age of 49 must have an adult with them who is not the patient. ?They may stay in the waiting area during the procedure and may switch out with other visitors. If the patient needs to stay at the hospital during part of their recovery, the visitor guidelines for inpatient rooms apply. ? ?Please refer to the [facility] website for the visitor guidelines for Inpatients (after your surgery is over and you are in a regular room).  ? ? ?Special instructions:   ? ?Oral Hygiene is also important to reduce your risk of infection.  Remember - BRUSH YOUR TEETH THE MORNING OF SURGERY WITH YOUR REGULAR TOOTHPASTE ? ? ?[facility]- Preparing For Surgery ? ?Before surgery, you can play an important role. Because skin is not sterile, your skin needs to be as free of germs as possible. You can reduce the number of germs on your skin by washing with CHG (chlorahexidine gluconate) Soap before surgery.  CHG is an antiseptic cleaner which kills germs and bonds with the skin to continue killing germs even after washing.   ? ? ?Please do not use if you have an allergy to CHG or antibacterial soaps. If your skin becomes reddened/irritated stop using the CHG.  ?Do  not shave (including legs and underarms) for at least 48 hours prior to first CHG shower. It is OK to shave your face. ? ?Please follow these instructions carefully. ?  ? ? Shower the NIGHT BEFORE SURGERY and the MORNING OF SURGERY with CHG Soap.  ? If you chose to wash your hair, wash your hair first as usual with your normal shampoo. After you shampoo, rinse your hair and body thoroughly to remove the shampoo.  Then [employer] and genitals (private parts) with your normal soap and rinse thoroughly to remove soap. ? ?After that Use CHG Soap as you would any other liquid soap. You can apply CHG directly to the skin and wash gently with a scrungie or a clean washcloth.  ? ?Apply the CHG Soap to your body ONLY FROM THE NECK DOWN.  Do not use on open wounds or open sores. Avoid contact with your  eyes, ears, mouth and genitals (private parts). Wash Face and genitals (private parts)  with your normal soap.  ? ?Wash thoroughly, paying special attention to the area where your surgery will be performed. ? ?Thoroughly rinse your body with warm water from the neck down. ? ?DO NOT shower/wash with your normal soap after using and rinsing off the CHG Soap. ? ?Pat yourself dry with a CLEAN TOWEL. ? ?Wear CLEAN PAJAMAS to bed the night before surgery ? ?Place CLEAN SHEETS on your bed the night before your surgery ? ?DO NOT SLEEP WITH PETS. ? ? ?Day of Surgery: ? ?Take a shower with CHG soap. ?Wear Clean/Comfortable clothing the morning of surgery ?Do not apply any deodorants/lotions.   ?Remember to brush your teeth WITH YOUR REGULAR TOOTHPASTE. ? ? ? ?If you received a COVID test during your pre-op visit, it is requested that you wear a mask when out in public, stay away from anyone that may not be feeling well, and notify your surgeon if you develop symptoms. If you have been in contact with anyone that has tested positive in the last 10 days, please notify your surgeon. ? ?  ?Please read over the following fact sheets that you  were given.   ?

## 2021-05-03 NOTE — Progress Notes (Signed)
REFERRING PROVIDER: ?Donnie Mesa, MD ?[facility] ?[address] ?[place],  [place] [address] ? ?PRIMARY PROVIDER:  ?Terald Sleeper, PA-C ? ?PRIMARY REASON FOR VISIT:  ?1. Family history of colon cancer   ?2. Family history of ovarian cancer   ?3. Ductal carcinoma in situ (DCIS) of right breast   ? ? ? ?HISTORY OF PRESENT ILLNESS:   ?Toni Crawford, a 46 y.o. female, was seen for a [place] cancer genetics consultation at the request of Dr. Georgette Dover due to a personal and family history of cancer.  Toni Crawford presents to clinic today to discuss the possibility of a hereditary predisposition to cancer, genetic testing, and to further clarify her future cancer risks, as well as potential cancer risks for family members. Burmese interpreter [phone] at [employer] helped interpret the session. ? ?In April 2023, at the age of 83, Toni Crawford was diagnosed with DCIS of the breast. The treatment plan includes lumpectomy and radiation.  ?   ? ?CANCER HISTORY:  ?Oncology History  ? No history exists.  ? ? ? ?RISK FACTORS:  ?Menarche was at age 32.  ?First live birth at age 34.  ?OCP use for approximately  <5  years.  ?Ovaries intact: yes.  ?Hysterectomy: no.  ?Menopausal status: premenopausal.  ?HRT use: 0 years. ?Colonoscopy: yes; normal. ?Mammogram within the last year: yes. ?Number of breast biopsies: 1. ?Up to date with pelvic exams: yes. ?Any excessive radiation exposure in the past: no ? ?Past Medical History:  ?Diagnosis Date  ? Family history of colon cancer   ? Family history of ovarian cancer   ? Gestational diabetes   ? glyburide  ? Heart murmur   ? told with pregnancy--no symptoms  ? Tuberculosis   ? "when I was young" states she received treatment  ? ? ?Past Surgical History:  ?Procedure Laterality Date  ? APPENDECTOMY    ? UPPER GASTROINTESTINAL ENDOSCOPY    ? ? ?Social History  ? ?Socioeconomic History  ? Marital status: Married  ?  Spouse name: Not on file  ? Number of children: 4  ? Years of education: Not on file  ?  Highest education level: 10th grade  ?Occupational History  ? Not on file  ?Tobacco Use  ? Smoking status: Never  ? Smokeless tobacco: Former  ?  Types: Snuff, Chew  ?Vaping Use  ? Vaping Use: Never used  ?Substance and Sexual Activity  ? Alcohol use: No  ? Drug use: No  ? Sexual activity: Yes  ?  Birth control/protection: None  ?Other Topics Concern  ? Not on file  ?Social History Narrative  ? Not on file  ? ?Social Determinants of Health  ? ?Financial Resource Strain: Not on file  ?Food Insecurity: Not on file  ?Transportation Needs: Not on file  ?Physical Activity: Not on file  ?Stress: Not on file  ?Social Connections: Not on file  ?  ? ?FAMILY HISTORY:  ?We obtained a detailed, 4-generation family history.  Significant diagnoses are listed below: ?Family History  ?Problem Relation Age of Onset  ? Cancer Mother   ?     uterus  ? Ovarian cancer Mother 67  ? Diabetes Father   ? Kidney disease Father   ? Colon cancer Sister 69  ? Diabetes Sister   ? Diabetes Sister   ? Colon cancer Maternal Uncle   ? Esophageal cancer Neg Hx   ? Rectal cancer Neg Hx   ? Stomach cancer Neg Hx   ? ? ? ?  The patient has four children who are cancer free.  She has seven sisters and one brother.  One sister had colon cancer at 11.  Both parents are deceased. ? ?The patient's mother had ovarian cancer and died at 47.  She had three brothers, one had colon cancer.  The grandparents are deceased for unknown reasons. ? ?The patient's father died of kidney disease.  He had two sisters and two brothers.  None had cancer.  It is unknown why the grandparents are deceased. ? ?Toni Crawford is unaware of previous family history of genetic testing for hereditary cancer risks. Patient's maternal ancestors are of Burmese descent, and paternal ancestors are of Burmese descent. There is no reported Ashkenazi Jewish ancestry. There is no known consanguinity. ? ?GENETIC COUNSELING ASSESSMENT: Toni Crawford is a 47 y.o. female with a personal and family history of  cancer which is somewhat suggestive of a hereditary cancer syndrome and predisposition to cancer given the young ages of onset and combination of cancer. We, therefore, discussed and recommended the following at today's visit.  ? ?DISCUSSION: We discussed that, in general, most cancer is not inherited in families, but instead is sporadic or familial. Sporadic cancers occur by chance and typically happen at older ages (>50 years) as this type of cancer is caused by genetic changes acquired during an individual?s lifetime. Some families have more cancers than would be expected by chance; however, the ages or types of cancer are not consistent with a known genetic mutation or known genetic mutations have been ruled out. This type of familial cancer is thought to be due to a combination of multiple genetic, environmental, hormonal, and lifestyle factors. While this combination of factors likely increases the risk of cancer, the exact source of this risk is not currently identifiable or testable. ? ?We discussed that 5 - 10% of breast cancer is hereditary, with most cases associated with BRCA mutations.  There are other genes that can be associated with hereditary breast cancer syndromes.  These include ATM, CHEK2, and PALB2.  Based on the family history of colon and ovarian cancer, there is also concern for Lynch syndrome.  In some families, breast cancer is also seen in Lynch syndrome.  We discussed that testing is beneficial for several reasons including knowing how to follow individuals after completing their treatment, identifying whether potential treatment options such as PARP inhibitors would be beneficial, and understand if other family members could be at risk for cancer and allow them to undergo genetic testing.  ? ?We reviewed the characteristics, features and inheritance patterns of hereditary cancer syndromes. We also discussed genetic testing, including the appropriate family members to test, the process  of testing, insurance coverage and turn-around-time for results. We discussed the implications of a negative, positive and/or variant of uncertain significant result. In order to get genetic test results in a timely manner so that Toni Crawford can use these genetic test results for surgical decisions, we recommended Toni Crawford pursue genetic testing for the BRCAPlus. Once complete, we recommend Toni Crawford pursue reflex genetic testing to the CancerNext-Expanded+RNAinsight gene panel.  ? ?The CancerNext-Expanded gene panel offered by [facility] and includes sequencing and rearrangement analysis for the following 77 genes: AIP, ALK, APC*, ATM*, AXIN2, BAP1, BARD1, BLM, BMPR1A, BRCA1*, BRCA2*, BRIP1*, CDC73, CDH1*, CDK4, CDKN1B, CDKN2A, CHEK2*, CTNNA1, DICER1, FANCC, FH, FLCN, GALNT12, KIF1B, LZTR1, MAX, MEN1, MET, MLH1*, MSH2*, MSH3, MSH6*, MUTYH*, NBN, NF1*, NF2, NTHL1, PALB2*, PHOX2B, PMS2*, POT1, PRKAR1A, PTCH1, PTEN*, RAD51C*, RAD51D*, RB1, RECQL,  RET, SDHA, SDHAF2, SDHB, SDHC, SDHD, SMAD4, SMARCA4, SMARCB1, SMARCE1, STK11, SUFU, TMEM127, TP53*, TSC1, TSC2, VHL and XRCC2 (sequencing and deletion/duplication); EGFR, EGLN1, HOXB13, KIT, MITF, PDGFRA, POLD1, and POLE (sequencing only); EPCAM and GREM1 (deletion/duplication only). DNA and RNA analyses performed for * genes.  ? ?Based on Toni Crawford's personal and family history of cancer, she meets medical criteria for genetic testing. Despite that she meets criteria, she may still have an out of pocket cost.  ? ?PLAN: After considering the risks, benefits, and limitations, Toni Crawford provided informed consent to pursue genetic testing and the blood sample was sent to [facility] for analysis of the CancerNext-Expanded+RNAinsight. Results should be available within approximately 2-3 weeks' time, at which point they will be disclosed by telephone to Toni Crawford, as will any additional recommendations warranted by these results. Toni Crawford will receive a summary of her genetic  counseling visit and a copy of her results once available. This information will also be available in Epic.  ? ?Lastly, we encouraged Toni Crawford to remain in contact with cancer genetics annually so that we can contin

## 2021-05-04 ENCOUNTER — Encounter (HOSPITAL_COMMUNITY)
Admission: RE | Admit: 2021-05-04 | Discharge: 2021-05-04 | Disposition: A | Discharge status: Home / Self Care | Payer: Medicaid Other | Source: Ambulatory Visit | Attending: Surgery | Admitting: Surgery

## 2021-05-04 ENCOUNTER — Other Ambulatory Visit: Payer: Self-pay

## 2021-05-04 ENCOUNTER — Encounter (HOSPITAL_COMMUNITY): Payer: Self-pay

## 2021-05-04 VITALS — BP 108/79 | HR 71 | Temp 98.0°F | Resp 18 | Ht 62.0 in | Wt 127.5 lb

## 2021-05-04 DIAGNOSIS — Z01818 Encounter for other preprocedural examination: Secondary | ICD-10-CM | POA: Insufficient documentation

## 2021-05-04 LAB — BASIC METABOLIC PANEL
Anion gap: 5 (ref 5–15)
BUN: 7 mg/dL (ref 6–20)
CO2: 27 mmol/L (ref 22–32)
Calcium: 9.1 mg/dL (ref 8.9–10.3)
Chloride: 109 mmol/L (ref 98–111)
Creatinine, Ser: 0.63 mg/dL (ref 0.44–1.00)
GFR, Estimated: >60 mL/min (ref >60)
Glucose, Bld: 141 mg/dL — ABNORMAL HIGH (ref 70–99)
Potassium: 3.7 mmol/L (ref 3.5–5.1)
Sodium: 141 mmol/L (ref 135–145)

## 2021-05-04 LAB — CBC
HCT: 37.9 % (ref 36.0–46.0)
Hemoglobin: 12.2 g/dL (ref 12.0–15.0)
MCH: 30.1 pg (ref 26.0–34.0)
MCHC: 32.2 g/dL (ref 30.0–36.0)
MCV: 93.6 fL (ref 80.0–100.0)
Platelets: 191 10*3/uL (ref 150–400)
RBC: 4.05 MIL/uL (ref 3.87–5.11)
RDW: 12.5 % (ref 11.5–15.5)
WBC: 5.1 10*3/uL (ref 4.0–10.5)
nRBC: 0 % (ref 0.0–0.2)

## 2021-05-04 NOTE — Progress Notes (Addendum)
?Radiation Oncology         ([phone]) [phone] ?________________________________ ? ?Initial Outpatient Consultation ? ?Name: Toni Crawford MRN: [id number]  ?Date: 05/05/2021  DOB: 1975-05-16 ? ?TI:WPYKD, Lorain Childes, MD  ? ?REFERRING PHYSICIAN: Donnie Mesa, MD ? ?DIAGNOSIS:  ?  ICD-10-CM   ?1. Ductal carcinoma in situ (DCIS) of right breast  D05.11   ?  ? ? ?Stage 0 (cTis (DCIS), cN0, cM0) Right Breast, Low-grade DCIS with necrosis and involving an intraductal papilloma, ER+ / PR+ / Her2 not assessed  ? ?CHIEF COMPLAINT: Here to discuss management of right breast DCIS ? ?HISTORY OF PRESENT ILLNESS::Toni Crawford is a 46 y.o. female who presented with a right breast abnormality on the following imaging: bilateral screening mammogram on the date of 03/15/21.  No symptoms, if any, were reported at that time.   Right breast diagnostic mammogram and ultrasound on 03/31/21 further revealed a 2.7 cm indeterminate mass containing suspicious calcifications in ?the 3 o'clock position of the right breast. No evidence right axillary adenopathy was appreciated.  ? ?Biopsy of the 3 o'clock right breast on the date of 04/11/21 showed low-grade ductal carcinoma in-situ and focal indeterminate grade ductal carcinoma in-situ with necrosis and involving an intraductal papilloma.  ER status: 100% positive; PR status 15% positive (both with strong staining intensity), Her2 not assessed. No lymph nodes were examined. ? ?Accordingly, the patient was referred to Dr. Georgette Dover on 04/28/21 to review treatment options. Following discussion of the risks and benefits, the patient expressed agreement in proceeding with right breast lumpectomy. Breast conserving surgery is scheduled for 05/24/21 with Dr. Georgette Dover.  ? ?She met with genetic counseling this week and results of genetic testing are pending. ? ?Translator present by video for consultation. ? ?PREVIOUS RADIATION THERAPY: No ? ?PAST MEDICAL HISTORY:  has a past medical history of  Family history of colon cancer, Family history of ovarian cancer, Gestational diabetes, Heart murmur, and Tuberculosis.   ? ?PAST SURGICAL HISTORY: ?Past Surgical History:  ?Procedure Laterality Date  ? APPENDECTOMY    ? UPPER GASTROINTESTINAL ENDOSCOPY    ? ? ?FAMILY HISTORY: family history includes Cancer in her mother; Colon cancer in her maternal uncle; Colon cancer (age of onset: 77) in her sister; Diabetes in her father, sister, and sister; Kidney disease in her father; Ovarian cancer (age of onset: 2) in her mother. ? ?SOCIAL HISTORY:  reports that she has never smoked. She has been exposed to tobacco smoke. She has quit using smokeless tobacco.  Her smokeless tobacco use included snuff and chew. She reports that she does not drink alcohol and does not use drugs. ? ?ALLERGIES: Patient has no known allergies. ? ?MEDICATIONS:  ?Current Outpatient Medications  ?Medication Sig Dispense Refill  ? cholecalciferol (VITAMIN D3) 25 MCG (1000 UT) tablet Take 1,000 Units by mouth daily.    ? Ferrous Sulfate (IRON) 325 (65 Fe) MG TABS Take 2 tablets by mouth daily in the afternoon.    ? ?No current facility-administered medications for this encounter.  ? ? ?REVIEW OF SYSTEMS: As above in HPI. ?  ?PHYSICAL EXAM:  height is _0  (1.575 m) and weight is 127 lb (57.6 kg). Her temperature is 97.7 ?F (36.5 ?C). Her blood pressure is 105/79 and her pulse is 72. Her respiration is 18 and oxygen saturation is 99%.   ?General: Alert and oriented, in no acute distress ?HEENT: Head is normocephalic. Extraocular movements are intact.   ? Heart: Regular in rate  and rhythm with no murmurs, rubs, or gallops. ?Chest: Clear to auscultation bilaterally, with no rhonchi, wheezes, or rales. ?Abdomen: Soft, nontender, nondistended, with no rigidity or guarding. ?Extremities: No cyanosis or edema. ?Musculoskeletal: symmetric strength and muscle tone throughout. ?Neurologic:   No obvious focalities. Speech is fluent. Coordination is  intact. ?Psychiatric: Judgment and insight are intact. Affect is appropriate. ?Breasts: No palpable masses appreciated in the breasts or axillae bilaterally. ? ? ? ?ECOG = 0 ? ?0 - Asymptomatic (Fully active, able to carry on all predisease activities without restriction) ? ?1 - Symptomatic but completely ambulatory (Restricted in physically strenuous activity but ambulatory and able to carry out work of a light or sedentary nature. For example, light housework, office work) ? ?2 - Symptomatic, <50% in bed during the day (Ambulatory and capable of all self care but unable to carry out any work activities. Up and about more than 50% of waking hours) ? ?3 - Symptomatic, >50% in bed, but not bedbound (Capable of only limited self-care, confined to bed or chair 50% or more of waking hours) ? ?4 - Bedbound (Completely disabled. Cannot carry on any self-care. Totally confined to bed or chair) ? ?5 - Death ? ? Oken MM, Creech RH, Tormey DC, et al. [phone]). "Toxicity and response criteria of the [facility]". [place] Oncol. 5 (6): 649-55 ? ? ?LABORATORY DATA:  ?Lab Results  ?Component Value Date  ? WBC 5.1 05/04/2021  ? HGB 12.2 05/04/2021  ? HCT 37.9 05/04/2021  ? MCV 93.6 05/04/2021  ? PLT 191 05/04/2021  ? ?CMP  ?   ?Component Value Date/Time  ? NA 141 05/04/2021 0955  ? K 3.7 05/04/2021 0955  ? CL 109 05/04/2021 0955  ? CO2 27 05/04/2021 0955  ? GLUCOSE 141 (H) 05/04/2021 0955  ? GLUCOSE 65 (L) 06/19/2013 1151  ? BUN 7 05/04/2021 0955  ? CREATININE 0.63 05/04/2021 0955  ? CALCIUM 9.1 05/04/2021 0955  ? GFRNONAA >60 05/04/2021 0955  ? ?   ? ?  ?RADIOGRAPHY: MM CLIP PLACEMENT RIGHT ? ?Result Date: 04/11/2021 ?CLINICAL DATA:  Evaluate post biopsy marker clip placement following ultrasound-guided core needle biopsy of a right breast mass. EXAM: 3D DIAGNOSTIC RIGHT MAMMOGRAM POST ULTRASOUND BIOPSY COMPARISON:  Previous exam(s). FINDINGS: 3D Mammographic images were obtained following ultrasound  guided biopsy of a cystic appearing mass in the 3 o'clock retroareolar right breast. The biopsy marking clip is in expected position at the site of biopsy. The ribbon clip lies in the retroareolar breast, slightly medial to midline. There calcifications which lie posterior and inferomedial to this. IMPRESSION: Appropriate positioning of the ribbon shaped biopsy marking clip at the site of biopsy in the 3 o'clock retroareolar right breast. Final Assessment: Post Procedure Mammograms for Marker Placement Electronically Signed   By: Lajean Manes M.D.   On: 04/11/2021 14:41 ? ?[place] RT BREAST BX W LOC DEV 1ST LESION IMG BX SPEC US GUIDE ? ?Addendum Date: 04/13/2021   ?ADDENDUM REPORT: 04/13/2021 13:20 ADDENDUM: Pathology revealed DUCTAL CARCINOMA IN-SITU (DCIS), GRADE I (LOW) AND FOCAL GRADE II (INTERMEDIATE), WITH NECROSIS, INVOLVING AN INTRADUCTAL PAPILLOMA- MICROCALCIFICATIONS PRESENT of the RIGHT breast, 3 o'clock, retroareolar (ribbon clip). This was found to be concordant by Dr. Lajean Manes. Pathology results were discussed with the patient's husband Shanon Brow) by telephone. The patient reported doing well after the biopsy with tenderness at the site. Post biopsy instructions and care were reviewed and questions were answered. The patient was encouraged to call  [facility] for any additional concerns. Surgical consultation has been arranged with Dr. Donnie Mesa at [facility] on April 28, 2021. Pathology results reported by Stacie Acres RN on 04/13/2021. Electronically Signed   By: Lajean Manes M.D.   On: 04/13/2021 13:20  ? ?Addendum Date: 04/12/2021   ?ADDENDUM REPORT: 04/12/2021 08:02 ADDENDUM: Laterality error in the procedure section. The biopsy was performed of the right breast, not the left, using an inferior approach. Electronically Signed   By: Lajean Manes M.D.   On: 04/12/2021 08:02  ? ?Result Date: 04/13/2021 ?CLINICAL DATA:  Patient presents for ultrasound-guided  core needle biopsy of a right breast mass. EXAM: ULTRASOUND GUIDED RIGHT BREAST CORE NEEDLE BIOPSY COMPARISON:  Previous exam(s). PROCEDURE: I met with the patient and we discussed the procedure of ultrasound-guided bi

## 2021-05-04 NOTE — Progress Notes (Signed)
PCP:  Particia Nearing, PA-C ?Cardiologist:  denies ? ?EKG: 05/04/21 ?CXR:  na ?ECHO:  01/19/10 ?Stress Test:   denies ?Cardiac Cath:  denies ?  ?Fasting Blood Sugar-  na ?Checks Blood Sugar__na_ times a day ? ?ASA/Blood Thinner: No ? ?OSA/CPAP: No ? ?Covid test not needed ? ?Anesthesia Review: yes, history heart murmur.   ? ?Patient's surgery has been rescheduled for 5/24.  She will come in on 5/22 for urine pregnancy test.  Seed placement 5/23. ? ?Patient denies shortness of breath, fever, cough, and chest pain at PAT appointment. ? ?Patient verbalized understanding of instructions provided today at the PAT appointment.  Patient asked to review instructions at home and day of surgery.   ?

## 2021-05-04 NOTE — Progress Notes (Signed)
Surgical Instructions ? ? ? Your procedure is scheduled on Tuesday, May 9th, 2023. ? ? Report to [facility] Main Entrance "A" at 11:15 A.M., then check in with the Admitting office. ? Call this number if you have problems the morning of surgery: ? [phone] ? ? If you have any questions prior to your surgery date call [phone]: Open Monday-Friday 8am-4pm ? ? ? Remember: ? Do not eat after midnight the night before your surgery ? ?You may drink clear liquids until 10:15 the morning of your surgery.   ?Clear liquids allowed are: Water, Non-Citrus Juices (without pulp), Carbonated Beverages, Clear Tea, Black Coffee ONLY (NO MILK, CREAM OR POWDERED CREAMER of any kind), and Gatorade ?  ? Take these medicines the morning of surgery with A SIP OF WATER: NONE ? ? ?As of today, STOP taking any Aspirin (unless otherwise instructed by your surgeon) Aleve, Naproxen, Ibuprofen, Motrin, Advil, Goody's, BC's, all herbal medications, fish oil, and all vitamins. ?     ? ? ?The day of surgery: ?         ?Do not wear jewelry or makeup ?Do not wear lotions, powders, perfumes, or deodorant. ?Do not shave 48 hours prior to surgery.   ?Do not bring valuables to the hospital. ?Do not wear nail polish, gel polish, artificial nails, or any other type of covering on natural nails (fingers and toes) ?If you have artificial nails or gel coating that need to be removed by a nail salon, please have this removed prior to surgery. Artificial nails or gel coating may interfere with anesthesia's ability to adequately monitor your vital signs. ? ?[facility] is not responsible for any belongings or valuables. .  ? ?Do NOT Smoke (Tobacco/Vaping)  24 hours prior to your procedure ? ?If you use a CPAP at night, you may bring your mask for your overnight stay. ?  ?Contacts, glasses, hearing aids, dentures or partials may not be worn into surgery, please bring cases for these belongings ?  ?For patients admitted to the hospital, discharge time will  be determined by your treatment team. ?  ?Patients discharged the day of surgery will not be allowed to drive home, and someone needs to stay with them for 24 hours. ? ? ?SURGICAL WAITING ROOM VISITATION ?Patients having surgery or a procedure in a hospital may have two support people. ?Children under the age of 79 must have an adult with them who is not the patient. ?They may stay in the waiting area during the procedure and may switch out with other visitors. If the patient needs to stay at the hospital during part of their recovery, the visitor guidelines for inpatient rooms apply. ? ?Please refer to the [facility] website for the visitor guidelines for Inpatients (after your surgery is over and you are in a regular room).  ? ? ?Special instructions:   ? ?Oral Hygiene is also important to reduce your risk of infection.  Remember - BRUSH YOUR TEETH THE MORNING OF SURGERY WITH YOUR REGULAR TOOTHPASTE ? ? ?[facility]- Preparing For Surgery ? ?Before surgery, you can play an important role. Because skin is not sterile, your skin needs to be as free of germs as possible. You can reduce the number of germs on your skin by washing with CHG (chlorahexidine gluconate) Soap before surgery.  CHG is an antiseptic cleaner which kills germs and bonds with the skin to continue killing germs even after washing.   ? ? ?Please do not use if you  have an allergy to CHG or antibacterial soaps. If your skin becomes reddened/irritated stop using the CHG.  ?Do not shave (including legs and underarms) for at least 48 hours prior to first CHG shower. It is OK to shave your face. ? ?Please follow these instructions carefully. ?  ? ? Shower the NIGHT BEFORE SURGERY and the MORNING OF SURGERY with CHG Soap.  ? If you chose to wash your hair, wash your hair first as usual with your normal shampoo. After you shampoo, rinse your hair and body thoroughly to remove the shampoo.  Then [employer] and genitals (private parts) with your normal soap  and rinse thoroughly to remove soap. ? ?After that Use CHG Soap as you would any other liquid soap. You can apply CHG directly to the skin and wash gently with a scrungie or a clean washcloth.  ? ?Apply the CHG Soap to your body ONLY FROM THE NECK DOWN.  Do not use on open wounds or open sores. Avoid contact with your eyes, ears, mouth and genitals (private parts). Wash Face and genitals (private parts)  with your normal soap.  ? ?Wash thoroughly, paying special attention to the area where your surgery will be performed. ? ?Thoroughly rinse your body with warm water from the neck down. ? ?DO NOT shower/wash with your normal soap after using and rinsing off the CHG Soap. ? ?Pat yourself dry with a CLEAN TOWEL. ? ?Wear CLEAN PAJAMAS to bed the night before surgery ? ?Place CLEAN SHEETS on your bed the night before your surgery ? ?DO NOT SLEEP WITH PETS. ? ? ?Day of Surgery: ? ?Take a shower with CHG soap. ?Wear Clean/Comfortable clothing the morning of surgery ?Do not apply any deodorants/lotions.   ?Remember to brush your teeth WITH YOUR REGULAR TOOTHPASTE. ? ? ? ?If you received a COVID test during your pre-op visit, it is requested that you wear a mask when out in public, stay away from anyone that may not be feeling well, and notify your surgeon if you develop symptoms. If you have been in contact with anyone that has tested positive in the last 10 days, please notify your surgeon. ? ?  ?Please read over the following fact sheets that you were given.   ?

## 2021-05-04 NOTE — Progress Notes (Signed)
Location of Breast Cancer:  ?Ductal carcinoma in situ (DCIS) of right breast   ? ?Histology per Pathology Report:  ?(Definitive pathology pending upcoming surgery)  ?04/11/2021 ?Breast, right, needle core biopsy, 3 o'clock, retroareolar ?- DUCTAL CARCINOMA IN-SITU (DCIS), GRADE I (LOW) AND FOCAL GRADE II (INTERMEDIATE), WITH NECROSIS, INVOLVING AN INTRADUCTAL PAPILLOMA. ?- MICROCALCIFICATIONS PRESENT. ?Microscopic Comment ?The atypical epithelial proliferation involving an intraductal papilloma, is most characteristic for DCIS (low-grade and ?focal intermediate grade) and there is focal intraluminal necrotic material. The epithelial proliferation is CK5/6 ?negative (characteristic for ADH/DCIS). The papilloma has fibrovascular cores lined by myoepithelial cell and the ?periphery of the duct has a myoepithelial lining (Smooth Muscle Myosin-Heavy Chain positive myoepithelial cells). The immunohistochemical (IHC) stains (CK5/6, Smooth Muscle myosin-HC) have adequate controls. ? ?Receptor Status: ER(100%), PR (15%) ? ?Did patient present with symptoms (if so, please note symptoms) or was this found on screening mammography?: Recent screening mammogram revealed some asymmetry in the retroareolar right breast ? ?Past/Anticipated interventions by surgeon, if any:  ?05/24/2021 ?--Dr. Donnie Mesa ?Scheduled for: RIGHT BREAST LUMPECTOMY WITH RADIOACTIVE SEED LOCALIZATION ? ?Past/Anticipated interventions by medical oncology, if any:  ?Scheduled for consultation with Dr. Nicholas Lose on 05/16/2021 ? ?Lymphedema issues, if any:  None   ? ?Pain issues, if any:  Patient denies  ? ?SAFETY ISSUES: ?Prior radiation? No ?Pacemaker/ICD? No ?Possible current pregnancy? No--LMP: 05/02/2021 ?Is the patient on methotrexate? No ? ?Current Complaints / other details:  Nothing else of note ?   ? ? ? ?

## 2021-05-05 ENCOUNTER — Ambulatory Visit
Admission: RE | Admit: 2021-05-05 | Discharge: 2021-05-05 | Disposition: A | Discharge status: Home / Self Care | Payer: Medicaid Other | Source: Ambulatory Visit | Attending: Radiation Oncology | Admitting: Radiation Oncology

## 2021-05-05 ENCOUNTER — Encounter: Payer: Self-pay | Admitting: Radiation Oncology

## 2021-05-05 VITALS — BP 105/79 | HR 72 | Temp 97.7°F | Resp 18 | Ht 62.0 in | Wt 127.0 lb

## 2021-05-05 DIAGNOSIS — Z800 Family history of malignant neoplasm of digestive organs: Secondary | ICD-10-CM | POA: Diagnosis not present

## 2021-05-05 DIAGNOSIS — D0511 Intraductal carcinoma in situ of right breast: Secondary | ICD-10-CM | POA: Diagnosis present

## 2021-05-05 DIAGNOSIS — Z170 Estrogen receptor positive status [ER+]: Secondary | ICD-10-CM | POA: Diagnosis not present

## 2021-05-11 ENCOUNTER — Encounter: Payer: Self-pay | Admitting: Genetic Counselor

## 2021-05-11 DIAGNOSIS — Z1379 Encounter for other screening for genetic and chromosomal anomalies: Secondary | ICD-10-CM | POA: Insufficient documentation

## 2021-05-15 ENCOUNTER — Telehealth: Payer: Self-pay | Admitting: Genetic Counselor

## 2021-05-15 NOTE — Telephone Encounter (Signed)
LM on VM that results are back and that I will try to call again next week. Used Burmese interpreter from [employer]. ?

## 2021-05-15 NOTE — Telephone Encounter (Signed)
Revealed BRCA1 mutation identified.  Patient is coming in for genetic counseling on Wed. 5/17 @ 3 PM ? ? ?

## 2021-05-16 ENCOUNTER — Inpatient Hospital Stay: Payer: Medicaid Other | Admitting: Hematology and Oncology

## 2021-05-16 NOTE — Assessment & Plan Note (Deleted)
04/11/2021:Screening mammogram detected right breast 2.7 cm indeterminate mass with calcifications, biopsy revealed low-grade DCIS with a focus of intermediate grade DCIS with necrosis involving intraductal papilloma, ER 100%, PR 15%  Pathology review: I discussed with the patient the difference between DCIS and invasive breast cancer. It is considered a precancerous lesion. DCIS is classified as a 0. It is generally detected through mammograms as calcifications. We discussed the significance of grades and its impact on prognosis. We also discussed the importance of ER and PR receptors and their implications to adjuvant treatment options. Prognosis of DCIS dependence on grade, comedo necrosis. It is anticipated that if not treated, 20-30% of DCIS can develop into invasive breast cancer.  Recommendation: 1. Breast conserving surgery 2. Followed by adjuvant radiation therapy 3. Followed by antiestrogen therapy with tamoxifen 5 years  Tamoxifen counseling: We discussed the risks and benefits of tamoxifen. These include but not limited to insomnia, hot flashes, mood changes, vaginal dryness, and weight gain. Although rare, serious side effects including endometrial cancer, risk of blood clots were also discussed. We strongly believe that the benefits far outweigh the risks. Patient understands these risks and consented to starting treatment. Planned treatment duration is 5 years.  Return to clinic after surgery to discuss the final pathology report and come up with an adjuvant treatment plan.

## 2021-05-17 ENCOUNTER — Other Ambulatory Visit: Payer: Self-pay | Admitting: *Deleted

## 2021-05-17 ENCOUNTER — Other Ambulatory Visit: Payer: Self-pay

## 2021-05-17 ENCOUNTER — Encounter: Payer: Self-pay | Admitting: *Deleted

## 2021-05-17 ENCOUNTER — Inpatient Hospital Stay (HOSPITAL_BASED_OUTPATIENT_CLINIC_OR_DEPARTMENT_OTHER): Payer: Medicaid Other | Admitting: Genetic Counselor

## 2021-05-17 DIAGNOSIS — Z1509 Genetic susceptibility to other malignant neoplasm: Secondary | ICD-10-CM | POA: Diagnosis not present

## 2021-05-17 DIAGNOSIS — Z1501 Genetic susceptibility to malignant neoplasm of breast: Secondary | ICD-10-CM

## 2021-05-17 DIAGNOSIS — D0511 Intraductal carcinoma in situ of right breast: Secondary | ICD-10-CM

## 2021-05-17 DIAGNOSIS — Z1379 Encounter for other screening for genetic and chromosomal anomalies: Secondary | ICD-10-CM

## 2021-05-18 ENCOUNTER — Telehealth: Payer: Self-pay | Admitting: Hematology and Oncology

## 2021-05-18 DIAGNOSIS — Z1509 Genetic susceptibility to other malignant neoplasm: Secondary | ICD-10-CM | POA: Insufficient documentation

## 2021-05-18 NOTE — Telephone Encounter (Signed)
R/s pt's appt. Spoke to pt's husband who is aware of new appt date and time.

## 2021-05-18 NOTE — Progress Notes (Signed)
GENETIC TEST RESULTS   Patient Name: Toni Crawford Patient Age: 46 y.o. Encounter Date: 05/17/2021  Referring Provider: Donnie Mesa, MD Nicholas Lose, MD    Toni Crawford was seen in the [place] clinic using a Burmese Interpreter from [employer] on May 17, 2021 due to a personal and family history of cancer and concern regarding a hereditary predisposition to cancer in the family. Please refer to the prior Genetics clinic note for more information regarding Toni Crawford's medical and family histories and our assessment at the time.   FAMILY HISTORY:  We obtained a detailed, 4-generation family history.  Significant diagnoses are listed below: Family History  Problem Relation Age of Onset   Cancer Mother        uterus   Ovarian cancer Mother 35   Diabetes Father    Kidney disease Father    Colon cancer Sister 65   Diabetes Sister    Diabetes Sister    Colon cancer Maternal Uncle    Esophageal cancer Neg Hx    Rectal cancer Neg Hx    Stomach cancer Neg Hx       The patient has four children who are cancer free.  She has seven sisters and one brother.  One sister had colon cancer at 39.  Both parents are deceased.   The patient's mother had ovarian cancer and died at 22.  She had three brothers, one had colon cancer.  The grandparents are deceased for unknown reasons.   The patient's father died of kidney disease.  He had two sisters and two brothers.  None had cancer.  It is unknown why the grandparents are deceased.   Toni Crawford is unaware of previous family history of genetic testing for hereditary cancer risks. Patient's maternal ancestors are of Burmese descent, and paternal ancestors are of [place]ese descent. There is no reported Ashkenazi Jewish ancestry. There is no known consanguinity.  GENETIC TESTING:  At the time of Toni Crawford's visit, we recommended she pursue genetic testing of the BRCAPlus and CancerNext-Expanded+RNAinsight test. The genetic testing reported out on  May 11, 2021 through the [place] offered by Althia Forts which identified a single, heterozygous likely pathogenic gene mutation called BRCA1, p.[id number] ([id number]). There were no deleterious mutations in ATM, BRCA2, CDH1, CHEK2, PALB2, PTEN, and TP53.  The remainder of her genetic testing is still pending.    CLINICAL CONDITION: The average woman's lifetime risk of developing breast cancer is 12%; her risk for developing ovarian cancer is 1.3% (SEER database 2014. https://seer.[employer]aved[employer].is. Accessed September 2017). Most cases of these cancers are sporadic and are not due to hereditary factors, but approximately 5-10% of breast and ovarian cancer cases are hereditary and due to an identifiable pathogenic variant in a disease-causing gene. Hereditary breast and ovarian cancer syndrome (HBOC) due to pathogenic variants in the BRCA1 and BRCA2 genes accounts for the majority of hereditary breast and ovarian cancer cases in individuals with a strong family history or an early-onset diagnosis.  HBOC syndrome is characterized by an increased lifetime risk for generally adult-onset cancers including breast, contralateral breast, female breast, ovarian, prostate, and pancreatic (PMID: 94709628).  The cancers associated with BRCA1 are:  Breast cancer, up to a 87% risk (PMID: 3662947, 65465035) Ovarian cancer, up to a 54% risk (PMID: 4656812, 75170017) Pancreatic cancer, 1-3% (PMID: 49449675, 91638466, 59935701, 77939030) Prostate cancer, elevated (09233007, 62263335)  INHERITANCE: Hereditary predisposition to cancer due to pathogenic variants in the BRCA1 gene has autosomal dominant inheritance. This  means that an individual with a pathogenic variant has a 50% chance of passing the condition on to his/her offspring. Most cases are inherited from a parent, but some cases may occur spontaneously (i.e., an individual with a pathogenic variant has parents who do not have it). Identification of a  pathogenic variant allows for the recognition of at-risk relatives who can pursue testing for the familial variant.  MANAGEMENT Management Guidelines for individuals with pathogenic BRCA1 variants have been developed by the [employer]  (NCCN):  NCCN cancer surveillance:  Females:  Breast awareness starting at age 45 Clinical breast exams every 6-12 months beginning at 45 years of age or at the age of the earliest diagnosed breast cancer in the family, if below age 70 Annual breast MRIs with contrast beginning between the ages of 54 and 63 (or annual mammograms with consideration of tomosynthesis if MRI is unavailable), although the age to initiate screening may be individualized based on family history Annual breast MRI with contrast and annual mammography with consideration of tomosynthesis between the ages of 45 and 29 After age 58, management should be considered on an individual basis. For women treated for breast cancer, screening of remaining breast tissue with annual mammography and breast MRI should continue. Consider risk-reducing mastectomy and counsel regarding degree of protection, degree of cancer risk, and reconstruction options. Address the psychosocial, social, and quality-of-life aspects of undergoing risk-reducing mastectomy. Recommend risk-reducing salpingo-oophorectomy, typically between age 48 and 34 years and upon the completion of childbearing. See specific Risk-Reducing Salpingo-Oophorectomy (RRSO) Protocol in NCCN Guidelines for Ovarian Cancer - Principles of Surgery. Ms. Rising does not have a GYN provider at this time.  She will need a referral to someone who can discuss prophylactic removal of her ovaries and fallopian tubes.  She does not remember the provider who delivered her youngest child, but the provider was part of the [facility]. Counseling includes a discussion of reproductive desires, extent of cancer risk, degree of  protection for breast and ovarian cancer, management of menopausal symptoms, possible short-term hormonal replacement and medical issues. Salpingectomy alone is not the standard of care for risk reduction, although clinical trials of interval salpingectomy and delayed oophorectomy are ongoing. The concern for risk reducing salpingectomy alone is that women are still at risk for developing ovarian cancer. In addition, in pre-menopausal women, oophorectomy likely reduces the risk of developing breast cancer, but the magnitude is uncertain and may be gene specific. For those patients who have not elected RRSO, transvaginal ultrasound combined with serum CA-125 for ovarian cancer screening has not been shown to be sufficient sensitive or specific as to support a positive recommendation, but, although of uncertain benefit, it may be considered at the clinician's discretion starting at age 30-35 years. Consider risk-reducing agents as options for breast and ovarian cancer.  Males:  Breast self-exam training and education starting at age 57 years Clinical breast exam every 12 months, beginning at age 17 years Consider prostate-cancer screening beginning at age 57 years.  Pancreatic cancer: NCCN 3.2023 recommend pancreatic cancer screening in BRCA1 carriers be limited to those with a first- or second-degree relative affected with pancreatic cancer. Ideally, screening should be performed in experienced centers utilizing a multidisciplinary approach under research conditions. Recommended screening includes annual endoscopic ultrasound and/or MRI of the pancreas starting at age 15 or 62 years younger than the earliest age of pancreatic cancer diagnosis in the family (PMID: 10175102).  Additional risk reduction and management considerations:  Chemoprevention can  reduce the risk of breast cancer in the contralateral breast in women with BRCA1 and BRCA2 mutations who have been diagnosed with breast cancer (PMID:  5329924, 26834196). Oral contraceptive use has been shown to reduce the risk of ovarian cancer by approximately 60% in BRCA mutation carriers if taken for at least 5 years (PMID: 2229798). Recent studies have some preliminary data that suggest PARP inhibitors may be a beneficial chemotherapeutic agent for a subset of patients with BRCA-associated breast and ovarian cancers. Clinical trials are currently in process to determine if and how these agents can be useful in the treatment of BRCA cancer patients (PMID: 92119417).  An individual's cancer risk and medical management are not determined by genetic test results alone. Overall cancer risk assessment incorporates additional factors including personal medical history, family history, as well as available genetic information that may result in a personalized plan for cancer prevention and surveillance.  FAMILY MEMBERS: It is important that all of Ms. Hanselman's relatives (both men and women) know of the presence of this gene mutation. Site-specific genetic testing can sort out who in the family is at risk and who is not. It is advantageous to know if a BRCA1 pathogenic variant is present as medical management recommendations can be implemented. At-risk relatives can be identified, allowing pursuit of a diagnostic evaluation. In addition, the available information regarding hereditary cancer susceptibility genes is constantly evolving and more clinically relevant BRCA1 data is likely to become available in the near future. Awareness of this cancer predisposition allows patients and their providers to be vigilant in maintaining close and regular contact with their local genetics clinic in anticipation of new information, inform at-risk family members, and diligently follow condition-specific screening protocols.  Ms. Huertas children are have a 50% chance to have inherited this mutation. However, most are relatively young and this will not be of any consequence to them  for several years. We do not test children because there is no risk to them until they are adults. We recommend they have genetic counseling and testing by the time they are in their early 20's.    Ms. Statzer oldest child and her siblings have a 50% chance to have inherited this mutation. We recommend they have genetic testing for this same mutation, as identifying the presence of this mutation would allow them to also take advantage of risk-reducing measures. Ms. Homewood reports that her siblings all live in [place], and that her sisters have had their uterus removed.  We spent some time discussing the difference between uterus, ovaries and fallopian tubes, and that her sisters should have their ovaries and fallopian tubes out.  I am unsure if there is a language barrier in this discussion.  SUPPORT AND RESOURCES: I have been unable to find support and resources that are written in Ms. Utecht's native language Burmese.  Using Google translate, I translated a few BRCA resources.  I explained to Ms. Sauls and her husband that there may be some language mistakes in the resource, and that I will continue to look for information for her in her native language so that she can continue to learn about BRCA1 cancer risks, as well as inform her family members so they may be able to be tested.  We will look for opportunities for her family members in [place] to be tested as well.  We encouraged Ms. Livingston to remain in contact with [place] on an annual basis so we can update her personal and family histories, and let her  know of advances in cancer genetics that may benefit the family. Our contact number was provided. Ms. Buckhalter questions were answered to her satisfaction today, and she knows she is welcome to call anytime with additional questions.   Mailee Klaas P. Florene Glen, [place], [facility] Licensed, Insurance risk surveyor [email or web address] phone: [phone]  The patient was seen for a total of 60 minutes in face-to-face genetic  counseling.

## 2021-05-19 ENCOUNTER — Other Ambulatory Visit: Payer: Self-pay | Admitting: *Deleted

## 2021-05-19 MED ORDER — TAMOXIFEN CITRATE 20 MG PO TABS
20.0000 mg | ORAL_TABLET | Freq: Every day | ORAL | 6 refills | Status: DC
Start: 2021-05-19 — End: 2021-06-23

## 2021-05-22 ENCOUNTER — Inpatient Hospital Stay (HOSPITAL_COMMUNITY): Admission: RE | Admit: 2021-05-22 | Payer: Medicaid Other | Source: Ambulatory Visit

## 2021-05-24 ENCOUNTER — Encounter: Payer: Self-pay | Admitting: *Deleted

## 2021-05-24 ENCOUNTER — Ambulatory Visit (HOSPITAL_COMMUNITY): Admission: RE | Admit: 2021-05-24 | Payer: Medicaid Other | Source: Home / Self Care | Admitting: Surgery

## 2021-05-24 SURGERY — BREAST LUMPECTOMY WITH RADIOACTIVE SEED LOCALIZATION
Anesthesia: General | Site: Breast | Laterality: Right

## 2021-05-26 ENCOUNTER — Ambulatory Visit: Payer: Self-pay | Admitting: Surgery

## 2021-05-27 ENCOUNTER — Ambulatory Visit: Payer: Self-pay | Admitting: Surgery

## 2021-05-27 NOTE — H&P (Signed)
Subjective    Chief Complaint: No chief complaint on file.       History of Present Illness: Toni Crawford is a 46 y.o. female who is seen today as an office consultation at the request of Dr. Georgette Dover for evaluation of No chief complaint on file. .   This is a healthy 46 year old [place] female with no family history of breast cancer who presents after recent screening mammogram revealed some asymmetry in the retroareolar right breast.  She underwent further work-up which revealed an area of DCIS measuring 2.7 x 2.3 x 1.1 cm at 0300 1 cmfn.   She was seen previously and we had planned a lumpectomy with radioactive seed localization.  However, genetic testing revealed a BRCA-1 mutation.  She returns to discuss her surgical options.  She is accompanied by her husband and her younger sister.  A telephone interpreter is used.   No previous breast surgery or breast problems.   Review of Systems: A complete review of systems was obtained from the patient.  I have reviewed this information and discussed as appropriate with the patient.  See HPI as well for other ROS.   Review of Systems  Constitutional: Negative.   HENT: Negative.   Eyes: Negative.   Respiratory: Negative.   Cardiovascular: Negative.   Gastrointestinal: Negative.   Genitourinary: Negative.   Musculoskeletal: Negative.   Skin: Negative.   Neurological: Negative.   Endo/Heme/Allergies: Negative.   Psychiatric/Behavioral: Negative.         Medical History: Past Medical History      Past Medical History:  Diagnosis Date   Diabetes mellitus without complication (CMS-HCC)             Patient Active Problem List  Diagnosis   Ductal carcinoma in situ (DCIS) of right breast      Past Surgical History  History reviewed. No pertinent surgical history.      Allergies  No Known Allergies           Current Outpatient Medications on File Prior to Visit  Medication Sig Dispense Refill   ferrous sulfate 325 (65 FE) MG  tablet Take by mouth        No current facility-administered medications on file prior to visit.      Family History       Family History  Problem Relation Age of Onset   High blood pressure (Hypertension) Sister     Diabetes Sister     High blood pressure (Hypertension) Brother     Diabetes Brother          Social History       Tobacco Use  Smoking Status Never  Smokeless Tobacco Never      Social History  Social History        Socioeconomic History   Marital status: Married  Tobacco Use   Smoking status: Never   Smokeless tobacco: Never  Vaping Use   Vaping Use: Never used  Substance and Sexual Activity   Alcohol use: Never   Drug use: Never        Objective:      There were no vitals filed for this visit.  There is no height or weight on file to calculate BMI.   Physical Exam    Constitutional:  WDWN in NAD, conversant, no obvious deformities; lying in bed comfortably Eyes:  Pupils equal, round; sclera anicteric; moist conjunctiva; no lid lag HENT:  Oral mucosa moist; good dentition  Neck:  No masses palpated, trachea midline; no thyromegaly Lungs:  CTA bilaterally; normal respiratory effort Breasts:  symmetric, no nipple changes; no palpable masses or lymphadenopathy on either side CV:  Regular rate and rhythm; no murmurs; extremities well-perfused with no edema Abd:  +bowel sounds, soft, non-tender, no palpable organomegaly; no palpable hernias Musc:  Normal gait; no apparent clubbing or cyanosis in extremities Lymphatic:  No palpable cervical or axillary lymphadenopathy Skin:  Warm, dry; no sign of jaundice Psychiatric - alert and oriented x 4; calm mood and affect     Labs, Imaging and Diagnostic Testing:   Diagnosis Breast, right, needle core biopsy, 3 o'clock, retroareolar - DUCTAL CARCINOMA IN-SITU (DCIS), GRADE I (LOW) AND FOCAL GRADE II (INTERMEDIATE), WITH NECROSIS, INVOLVING AN INTRADUCTAL PAPILLOMA. - MICROCALCIFICATIONS  PRESENT. Microscopic Comment The atypical epithelial proliferation involving an intraductal papilloma, is most characteristic for DCIS (low-grade and focal intermediate grade) and there is focal intraluminal necrotic material. The epithelial proliferation is CK5/6 negative (characteristic for ADH/DCIS). The papilloma has fibrovascular cores lined by myoepithelial cell and the periphery of the duct has a myoepithelial lining (Smooth Muscle Myosin-Heavy Chain positive myoepithelial cells). The immunohistochemical (IHC) stains (CK5/6, Smooth Muscle myosin-HC) have adequate controls. Dr. Thornell Sartorius has also viewed the H&E slides (Intradepartmental QA review) and concurs. The [facility]) was telephoned a preliminary diagnosis (pending IHC stain results) on 04/12/2021 by Dr. Enid Baas MD Pathologist, Electronic Signature (Case signed 04/12/2021)   PROGNOSTIC INDICATORS Results: IMMUNOHISTOCHEMICAL AND MORPHOMETRIC ANALYSIS PERFORMED MANUALLY Estrogen Receptor: 100%, POSITIVE, STRONG STAINING INTENSITY Progesterone Receptor: 15%, POSITIVE, STRONG STAINING INTENSITY REFERENCE RANGE ESTROGEN RECEPTOR NEGATIVE 0% POSITIVE =>1% REFERENCE RANGE PROGESTERONE RECEPTOR NEGATIVE 0% POSITIVE =>1% All controls stained appropriately Thressa Sheller MD Pathologist, Electronic Signature ( Signed 04/21/2021)   CLINICAL DATA:  Possible right breast asymmetry with calcifications in the slightly lower inner right breast on a recent screening mammogram.   EXAM: DIGITAL DIAGNOSTIC UNILATERAL RIGHT MAMMOGRAM WITH TOMOSYNTHESIS AND CAD; ULTRASOUND RIGHT BREAST LIMITED   TECHNIQUE: Right digital diagnostic mammography and breast tomosynthesis was performed. The images were evaluated with computer-aided detection.; Targeted ultrasound examination of the right breast was performed   COMPARISON:  Previous exam(s).   ACR Breast Density Category b: There are scattered areas  of fibroglandular density.   FINDINGS: 3D tomographic and 2D generated true lateral and spot compression images of the left breast were obtained as well as 2D spot magnification images. These demonstrate a developing asymmetry with developing calcifications spanning a 4.3 x 2.5 x 2.4 cm area extending posteriorly from the nipple into the posterior aspect of the slightly right breast centered slightly medially and inferiorly. The calcifications vary in size, shape and density with no layering.   Targeted ultrasound is performed, showing a horizontally oriented, irregular, heterogeneous mass centered in the 3 o'clock position of the right breast, 1 cm from the nipple. This has anechoic, this has anechoic, hypoechoic and intermediate echogenicity components and contains multiple small calcifications. This corresponds to the mammographic asymmetry and calcifications. There is internal blood flow with power Doppler in a soft tissue component containing calcifications. This mass measures 2.7 x 2.3 x 1.1 cm.   Ultrasound of the right axilla demonstrated normal appearing right axillary lymph nodes.   IMPRESSION: 2.7 cm indeterminate mass containing suspicious calcifications in the 3 o'clock position of the right breast.   RECOMMENDATION: Ultrasound-guided core needle biopsy of the 2.7 cm right breast mass. This has been discussed with the patient and scheduled at 1:45 p.m.  on 04/11/2021.   I have discussed the findings and recommendations with the patient. If applicable, a reminder letter will be sent to the patient regarding the next appointment.   BI-RADS CATEGORY  4: Suspicious.   Electronically Signed: By: Claudie Revering M.D. On: 03/31/2021 16:56   Assessment and Plan:  Diagnoses and all orders for this visit:   Ductal carcinoma in situ (DCIS) of right breast -     Ambulatory Referral to Plastic Surgery   BRCA1 positive -     Ambulatory Referral to Plastic Surgery     I  spent some time with the patient and her husband and sister discussing her disease and the recommended treatment.  This seems to be Stage 0 breast cancer that could typically be cured with excision to clear margins, followed by radiation and probable anti-estrogens. However, with the BRCA-1 mutation, her risk of recurrence is very high, so we discussed other surgical options.   After a long discussion, she has opted for bilateral mastectomies with immediate reconstruction.  At the time of surgery, we will inject Magtrace.  If the pathology report shows invasive cancer, we will go back and perform a sentinel lymph node biopsy.   The surgical procedure has been discussed with the patient.  Potential risks, benefits, alternative treatments, and expected outcomes have been explained.  All of the patient's questions at this time have been answered.  The likelihood of reaching the patient's treatment goal is good.  The patient understand the proposed surgical procedure and wishes to proceed.   We will refer her to Plastic Surgery to discuss immediate reconstruction.  She is starting Tamoxifen while we are waiting to plan her surgery.   Eventually, she will need to have bilateral oophorectomy with her BRCA-1 status.  We will refer her to GYN at a later date.       Arine Foley Jearld Adjutant, MD  05/27/2021 11:28 AM

## 2021-05-30 ENCOUNTER — Encounter: Payer: Self-pay | Admitting: *Deleted

## 2021-06-05 ENCOUNTER — Ambulatory Visit: Payer: Self-pay | Admitting: Surgery

## 2021-06-09 ENCOUNTER — Ambulatory Visit: Payer: Self-pay | Admitting: Genetic Counselor

## 2021-06-09 ENCOUNTER — Telehealth: Payer: Self-pay | Admitting: Genetic Counselor

## 2021-06-09 DIAGNOSIS — Z1379 Encounter for other screening for genetic and chromosomal anomalies: Secondary | ICD-10-CM

## 2021-06-09 DIAGNOSIS — D0511 Intraductal carcinoma in situ of right breast: Secondary | ICD-10-CM

## 2021-06-09 DIAGNOSIS — Z1501 Genetic susceptibility to malignant neoplasm of breast: Secondary | ICD-10-CM

## 2021-06-09 NOTE — Telephone Encounter (Signed)
Revealed that the remainder of testing identified the original BRCA1 pathogenic vairant, and then also additional VUSs, but nothing else that is pathogenic.  Used the [employer].

## 2021-06-09 NOTE — Progress Notes (Signed)
HPI:  Toni Crawford was previously seen in the [facility] due to a personal and family history of cancer and concerns regarding a hereditary predisposition to cancer. Please refer to our prior cancer genetics clinic note for more information regarding our discussion, assessment and recommendations, at the time. Toni Crawford recent genetic test results were disclosed to her, as were recommendations warranted by these results. These results and recommendations are discussed in more detail below.  CANCER HISTORY:  Oncology History  Ductal carcinoma in situ (DCIS) of right breast  04/11/2021 Initial Diagnosis   Screening mammogram detected right breast 2.7 cm indeterminate mass with calcifications, biopsy revealed low-grade DCIS with a focus of intermediate grade DCIS with necrosis involving intraductal papilloma, ER 100%, PR 15%   05/24/2021 Surgery   Right lumpectomy by Dr. Georgette Dover     FAMILY HISTORY:  We obtained a detailed, 4-generation family history.  Significant diagnoses are listed below: Family History  Problem Relation Age of Onset   Cancer Mother        uterus   Ovarian cancer Mother 26   Diabetes Father    Kidney disease Father    Colon cancer Sister 64   Diabetes Sister    Diabetes Sister    Colon cancer Maternal Uncle    Esophageal cancer Neg Hx    Rectal cancer Neg Hx    Stomach cancer Neg Hx        The patient has four children who are cancer free.  She has seven sisters and one brother.  One sister had colon cancer at 48.  Both parents are deceased.   The patient's mother had ovarian cancer and died at 69.  She had three brothers, one had colon cancer.  The grandparents are deceased for unknown reasons.   The patient's father died of kidney disease.  He had two sisters and two brothers.  None had cancer.  It is unknown why the grandparents are deceased.   Toni Crawford is unaware of previous family history of genetic testing for hereditary cancer risks. Patient's  maternal ancestors are of [place]mese descent, and paternal ancestors are of [place]ese descent. There is no reported Ashkenazi Jewish ancestry. There is no known consanguinity.  GENETIC TEST RESULTS: Genetic testing reported out on May 25, 2021 through the CancerNext-Expanded+RNAinsight cancer panel found no additional pathogenic mutations, other than the original BRCA1 p.C1697R (c.5089T>C) Likely Pathogenic variant. The CancerNext-Expanded gene panel offered by [facility] and includes sequencing and rearrangement analysis for the following 77 genes: AIP, ALK, APC*, ATM*, AXIN2, BAP1, BARD1, BLM, BMPR1A, BRCA1*, BRCA2*, BRIP1*, CDC73, CDH1*, CDK4, CDKN1B, CDKN2A, CHEK2*, CTNNA1, DICER1, FANCC, FH, FLCN, GALNT12, KIF1B, LZTR1, MAX, MEN1, MET, MLH1*, MSH2*, MSH3, MSH6*, MUTYH*, NBN, NF1*, NF2, NTHL1, PALB2*, PHOX2B, PMS2*, POT1, PRKAR1A, PTCH1, PTEN*, RAD51C*, RAD51D*, RB1, RECQL, RET, SDHA, SDHAF2, SDHB, SDHC, SDHD, SMAD4, SMARCA4, SMARCB1, SMARCE1, STK11, SUFU, TMEM127, TP53*, TSC1, TSC2, VHL and XRCC2 (sequencing and deletion/duplication); EGFR, EGLN1, HOXB13, KIT, MITF, PDGFRA, POLD1, and POLE (sequencing only); EPCAM and GREM1 (deletion/duplication only). DNA and RNA analyses performed for * genes. The test report has been scanned into EPIC and is located under the Molecular Pathology section of the Results Review tab.  A portion of the result report is included below for reference.     We discussed with Toni Crawford that because current genetic testing is not perfect, it is possible there may be a gene mutation in one of these genes that current testing cannot detect, but that chance  is small.  We also discussed, that there could be another gene that has not yet been discovered, or that we have not yet tested, that is responsible for the cancer diagnoses in the family. It is also possible there is a hereditary cause for the cancer in the family that Toni Crawford did not inherit and therefore was not identified in her  testing.  Therefore, it is important to remain in touch with cancer genetics in the future so that we can continue to offer Toni Crawford the most up to date genetic testing.   Genetic testing did identify four Variants of uncertain significance (VUS) - one in the AXIN2 gene called p.C241G, a second in the CDKN2A gene called p.V115L, a third in the MSH3 gene called p.F198L and a fourth in the [place] gene called p.A264T.  At this time, it is unknown if these variants are associated with increased cancer risk or if they are normal findings, but most variants such as these get reclassified to being inconsequential. They should not be used to make medical management decisions. With time, we suspect the lab will determine the significance of these variants, if any. If we do learn more about them, we will try to contact Toni Crawford to discuss it further. However, it is important to stay in touch with [place] periodically and keep the address and phone number up to date.  Please see previous note on the genetic counseling session for the BRCA1 Likely Pathogenic variant.  ADDITIONAL GENETIC TESTING: We discussed with Toni Crawford that her genetic testing was fairly extensive.  If there are genes identified to increase cancer risk that can be analyzed in the future, we would be happy to discuss and coordinate this testing at that time.    CANCER SCREENING RECOMMENDATIONS: Toni Crawford test result is considered negative (normal).  This means that we have not identified a hereditary cause for her personal and family history of cancer at this time. Most cancers happen by chance and this negative test suggests that her cancer may fall into this category.    While reassuring, this does not definitively rule out a hereditary predisposition to cancer. It is still possible that there could be genetic mutations that are undetectable by current technology. There could be genetic mutations in genes that have not been tested or identified to increase  cancer risk.  Therefore, it is recommended she continue to follow the cancer management and screening guidelines provided by her oncology and primary healthcare provider.   An individual's cancer risk and medical management are not determined by genetic test results alone. Overall cancer risk assessment incorporates additional factors, including personal medical history, family history, and any available genetic information that may result in a personalized plan for cancer prevention and surveillance  RECOMMENDATIONS FOR FAMILY MEMBERS:  Individuals in this family might be at some increased risk of developing cancer, over the general population risk, simply due to the family history of cancer.  We recommended women in this family have a yearly mammogram beginning at age 44, or 35 years younger than the earliest onset of cancer, an annual clinical breast exam, and perform monthly breast self-exams. Women in this family should also have a gynecological exam as recommended by their primary provider. All family members should be referred for colonoscopy starting at age 76.  We recommend that all of Ms. Fonte's relatives undergo genetic testing for the known BRCA1 Likely Pathogenic variant.  We would be happy to help coordinate this as needed.  FOLLOW-UP: Lastly, we discussed with Toni Crawford that cancer genetics is a rapidly advancing field and it is possible that new genetic tests will be appropriate for her and/or her family members in the future. We encouraged her to remain in contact with cancer genetics on an annual basis so we can update her personal and family histories and let her know of advances in cancer genetics that may benefit this family.   Our contact number was provided. Toni Crawford questions were answered to her satisfaction, and she knows she is welcome to call us at anytime with additional questions or concerns.   Roma Kayser, [place], [facility] Licensed, Certified Genetic  Counselor [email or web address]

## 2021-06-09 NOTE — H&P (Signed)
Subjective:     Patient ID: Toni Crawford is a 46 y.o. female.   HPI   Patient of Drs. Tsuei here for consultation breast reconstruction. Presented following screening MMG with right breast asymmetry. Diagnostic MMG/US showed a 2.7 x 2.3 x 1.1 cm mass right breast  3 o'clock, 1 cmfn. Axilla normal. Biopsy demonstrated low and intermediate grade DCIS with necrosis micro calcs involving an intraductal papilloma, ER/PR+.   Given genetics bilateral mastectomies planned. Tamoxifen started as surgical planning completed.   Genetics with BRCA1 mutation. Mother with ovarian ca. Sister with colon ca. Additional VUS in AXIN2, CDKN2A, MSH3, and NTHL1 genes.    Current bra size not sure thinks B desires larger.   Accompanied by sister and spouse. 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   Review of Systems Remainder 12 point review negative    Objective:   Physical Exam Cardiovascular:     Rate and Rhythm: Normal rate and regular rhythm.     Heart sounds: Normal heart sounds.  Pulmonary:     Effort: Pulmonary effort is normal.     Breath sounds: Normal breath sounds.  Lymphadenopathy:     Upper Body:     Right upper body: No axillary adenopathy.     Left upper body: No axillary adenopathy.  Skin:    Comments: Fitzpatrick 4     Breasts: no ptosis bilateral no masses palpable SN to nipple R 21 L 21 cm BW R 15 L 15 cm CW 13 cm Nipple to IMF R 5 L 5 cm      Assessment:     DCIS right breast BRCA1    Plan:     Message sent to Oncology to reschedule Dr. Lindi Adie visit. Hold tamoxifen week prior to surgery.   Plan bilateral mastectomies with immediate tissue expander acellular dermis reconstruction.   Reviewed drains, OR length, hospital stay and recovery, limitations. Discussed process of expansion and implant based risks including rupture, imaging surveillance for silicone implants, infection requiring surgery or removal, contracture. Reviewed implants are not  permanent devices and will require additional surgery.  Counseled expander placement at time of mastectomy does not preclude her from autologous reconstruction in future. Reviewed reconstruction will be asensate. Reviewed risks mastectomy flap necrosis requiring additional surgery.   Discussed use of acellular dermis in reconstruction, cadaveric source, incorporation over several weeks, risk that if has seroma or infection can act as additional nidus for infection if not incorporated.    Discussed prepectoral vs sub pectoral reconstruction. Discussed with patient and benefit of this is no animation deformity, may be less pain. Risk may be more visible rippling over upper poles, greater need of ADM. Reviewed pre pectoral would require larger amount acellular dermis, more drains. Discussed any type reconstruction also risks long term displacement implant and visible rippling. If prepectoral counseled I would recommend she be comfortable with silicone implants as more options that have less rippling. She agrees to prepectoral placement.   Reviewed reconstruction will be asensate and not stimulate. Reviewed additional risks including but not limited to risks seroma, hematoma, asymmetry, need to additional procedures, fat necrosis, DVT/PE, damage to adjacent structures, cardiopulmonary complications.   Drain teaching completed. Rx for tramadol robaxin and Bactrim given.   Aesthetically she is candidate for NSM, will discuss with Dr. Georgette Dover. Patient understands even if this is completed may require additional procedure to remove nipple if on final pathology this is involved with cancer.     Visit completed with aid  remote [employer] interpreter.

## 2021-06-12 ENCOUNTER — Encounter: Payer: Self-pay | Admitting: *Deleted

## 2021-06-12 ENCOUNTER — Telehealth: Payer: Self-pay | Admitting: Hematology and Oncology

## 2021-06-12 NOTE — Telephone Encounter (Signed)
R/s pt's appt per 6/9 staff msg from nurse navigator. Called pt's husband, Shanon Brow. No answer. Left msg with new appt date and time. Requested for him to call me back to confirm change.

## 2021-06-13 ENCOUNTER — Other Ambulatory Visit: Payer: Self-pay

## 2021-06-13 ENCOUNTER — Encounter (HOSPITAL_BASED_OUTPATIENT_CLINIC_OR_DEPARTMENT_OTHER): Payer: Self-pay | Admitting: Surgery

## 2021-06-19 ENCOUNTER — Ambulatory Visit: Payer: Medicaid Other | Admitting: Radiation Oncology

## 2021-06-19 ENCOUNTER — Telehealth: Payer: Self-pay

## 2021-06-19 ENCOUNTER — Ambulatory Visit: Payer: Medicaid Other

## 2021-06-19 NOTE — Telephone Encounter (Signed)
Called and spoke with patient's husband via interpreter line to inform them that patient's CT simulation appointment this morning is canceled, and will be rescheduled after she has been cleared by her surgeon. Husband verbalized understanding and appreciation of call. No other needs identified at this time

## 2021-06-19 NOTE — Progress Notes (Signed)

## 2021-06-22 ENCOUNTER — Other Ambulatory Visit: Payer: Self-pay

## 2021-06-22 ENCOUNTER — Encounter (HOSPITAL_BASED_OUTPATIENT_CLINIC_OR_DEPARTMENT_OTHER)
Admission: RE | Disposition: A | Discharge status: Home / Self Care | Payer: Self-pay | Source: Home / Self Care | Attending: Plastic Surgery

## 2021-06-22 ENCOUNTER — Ambulatory Visit (HOSPITAL_BASED_OUTPATIENT_CLINIC_OR_DEPARTMENT_OTHER): Payer: Medicaid Other | Admitting: Anesthesiology

## 2021-06-22 ENCOUNTER — Ambulatory Visit: Payer: Medicaid Other | Admitting: Hematology and Oncology

## 2021-06-22 ENCOUNTER — Encounter (HOSPITAL_BASED_OUTPATIENT_CLINIC_OR_DEPARTMENT_OTHER): Payer: Self-pay | Admitting: Surgery

## 2021-06-22 ENCOUNTER — Ambulatory Visit (HOSPITAL_BASED_OUTPATIENT_CLINIC_OR_DEPARTMENT_OTHER)
Admission: RE | Admit: 2021-06-22 | Discharge: 2021-06-23 | Disposition: A | Discharge status: Home / Self Care | Payer: Medicaid Other | Attending: Plastic Surgery | Admitting: Plastic Surgery

## 2021-06-22 DIAGNOSIS — N6022 Fibroadenosis of left breast: Secondary | ICD-10-CM | POA: Diagnosis not present

## 2021-06-22 DIAGNOSIS — D242 Benign neoplasm of left breast: Secondary | ICD-10-CM | POA: Insufficient documentation

## 2021-06-22 DIAGNOSIS — D0511 Intraductal carcinoma in situ of right breast: Secondary | ICD-10-CM | POA: Diagnosis present

## 2021-06-22 DIAGNOSIS — Z1501 Genetic susceptibility to malignant neoplasm of breast: Secondary | ICD-10-CM | POA: Insufficient documentation

## 2021-06-22 DIAGNOSIS — C50911 Malignant neoplasm of unspecified site of right female breast: Secondary | ICD-10-CM | POA: Diagnosis present

## 2021-06-22 DIAGNOSIS — E119 Type 2 diabetes mellitus without complications: Secondary | ICD-10-CM | POA: Insufficient documentation

## 2021-06-22 DIAGNOSIS — Z170 Estrogen receptor positive status [ER+]: Secondary | ICD-10-CM | POA: Insufficient documentation

## 2021-06-22 DIAGNOSIS — Z01818 Encounter for other preprocedural examination: Secondary | ICD-10-CM

## 2021-06-22 HISTORY — DX: Intraductal carcinoma in situ of unspecified breast: D05.10

## 2021-06-22 HISTORY — PX: SIMPLE MASTECTOMY WITH AXILLARY SENTINEL NODE BIOPSY: SHX6098

## 2021-06-22 HISTORY — PX: BREAST RECONSTRUCTION WITH PLACEMENT OF TISSUE EXPANDER AND ALLODERM: SHX6805

## 2021-06-22 LAB — POCT PREGNANCY, URINE: Preg Test, Ur: NEGATIVE

## 2021-06-22 SURGERY — SIMPLE MASTECTOMY
Anesthesia: Regional | Site: Chest | Laterality: Bilateral

## 2021-06-22 MED ORDER — MAGTRACE LYMPHATIC TRACER
INTRAMUSCULAR | Status: DC | PRN
  Administered 2021-06-22: 3 mL via INTRAMUSCULAR

## 2021-06-22 MED ORDER — ACETAMINOPHEN 500 MG PO TABS
1000.0000 mg | ORAL_TABLET | ORAL | Status: AC
  Administered 2021-06-22: 1000 mg via ORAL

## 2021-06-22 MED ORDER — LIDOCAINE 2% (20 MG/ML) 5 ML SYRINGE
INTRAMUSCULAR | Status: AC
  Filled 2021-06-22: qty 5

## 2021-06-22 MED ORDER — KETOROLAC TROMETHAMINE 30 MG/ML IJ SOLN
30.0000 mg | Freq: Three times a day (TID) | INTRAMUSCULAR | Status: DC
  Administered 2021-06-23: 30 mg via INTRAVENOUS
  Filled 2021-06-22: qty 1

## 2021-06-22 MED ORDER — HYDROMORPHONE HCL 1 MG/ML IJ SOLN
INTRAMUSCULAR | Status: AC
  Filled 2021-06-22: qty 0.5

## 2021-06-22 MED ORDER — EPHEDRINE 5 MG/ML INJ
INTRAVENOUS | Status: AC
  Filled 2021-06-22: qty 5

## 2021-06-22 MED ORDER — PHENYLEPHRINE HCL (PRESSORS) 10 MG/ML IV SOLN
INTRAVENOUS | Status: DC | PRN
  Administered 2021-06-22: 160 ug via INTRAVENOUS

## 2021-06-22 MED ORDER — ROCURONIUM BROMIDE 100 MG/10ML IV SOLN
INTRAVENOUS | Status: DC | PRN
  Administered 2021-06-22: 70 mg via INTRAVENOUS

## 2021-06-22 MED ORDER — KCL IN DEXTROSE-NACL 20-5-0.45 MEQ/L-%-% IV SOLN
INTRAVENOUS | Status: DC
  Filled 2021-06-22: qty 1000

## 2021-06-22 MED ORDER — MIDAZOLAM HCL 2 MG/2ML IJ SOLN
INTRAMUSCULAR | Status: AC
  Filled 2021-06-22: qty 2

## 2021-06-22 MED ORDER — CHLORHEXIDINE GLUCONATE CLOTH 2 % EX PADS: 6.0000 | MEDICATED_PAD | Freq: Once | CUTANEOUS | Status: DC

## 2021-06-22 MED ORDER — ROCURONIUM BROMIDE 10 MG/ML (PF) SYRINGE
PREFILLED_SYRINGE | INTRAVENOUS | Status: AC
  Filled 2021-06-22: qty 10

## 2021-06-22 MED ORDER — SUGAMMADEX SODIUM 200 MG/2ML IV SOLN
INTRAVENOUS | Status: DC | PRN
  Administered 2021-06-22: 150 mg via INTRAVENOUS

## 2021-06-22 MED ORDER — BUPIVACAINE HCL (PF) 0.25 % IJ SOLN
INTRAMUSCULAR | Status: DC | PRN
  Administered 2021-06-22: 30 mL via PERINEURAL

## 2021-06-22 MED ORDER — SUCCINYLCHOLINE CHLORIDE 200 MG/10ML IV SOSY
PREFILLED_SYRINGE | INTRAVENOUS | Status: AC
  Filled 2021-06-22: qty 10

## 2021-06-22 MED ORDER — ACETAMINOPHEN 500 MG PO TABS: 1000.0000 mg | ORAL_TABLET | Freq: Once | ORAL | Status: DC

## 2021-06-22 MED ORDER — ONDANSETRON 4 MG PO TBDP: 4.0000 mg | ORAL_TABLET | Freq: Four times a day (QID) | ORAL | Status: DC | PRN

## 2021-06-22 MED ORDER — DIPHENHYDRAMINE HCL 50 MG/ML IJ SOLN
INTRAMUSCULAR | Status: DC | PRN
  Administered 2021-06-22: 6.25 mg via INTRAVENOUS

## 2021-06-22 MED ORDER — ACETAMINOPHEN 500 MG PO TABS
ORAL_TABLET | ORAL | Status: AC
  Filled 2021-06-22: qty 2

## 2021-06-22 MED ORDER — FENTANYL CITRATE (PF) 100 MCG/2ML IJ SOLN
INTRAMUSCULAR | Status: AC
  Filled 2021-06-22: qty 2

## 2021-06-22 MED ORDER — AMISULPRIDE (ANTIEMETIC) 5 MG/2ML IV SOLN
10.0000 mg | Freq: Once | INTRAVENOUS | Status: DC | PRN
Start: 2021-06-22 — End: 2021-06-22

## 2021-06-22 MED ORDER — OXYCODONE HCL 5 MG PO TABS
5.0000 mg | ORAL_TABLET | Freq: Once | ORAL | Status: DC | PRN
  Filled 2021-06-22: qty 1

## 2021-06-22 MED ORDER — MEPERIDINE HCL 25 MG/ML IJ SOLN: 6.2500 mg | INTRAMUSCULAR | Status: DC | PRN

## 2021-06-22 MED ORDER — CEFAZOLIN SODIUM-DEXTROSE 2-4 GM/100ML-% IV SOLN
2.0000 g | INTRAVENOUS | Status: AC
  Administered 2021-06-22: 2 g via INTRAVENOUS

## 2021-06-22 MED ORDER — ENOXAPARIN SODIUM 40 MG/0.4ML IJ SOSY
40.0000 mg | PREFILLED_SYRINGE | INTRAMUSCULAR | Status: DC
  Administered 2021-06-23: 40 mg via SUBCUTANEOUS
  Filled 2021-06-22: qty 0.4

## 2021-06-22 MED ORDER — TRAMADOL HCL 50 MG PO TABS
50.0000 mg | ORAL_TABLET | Freq: Four times a day (QID) | ORAL | Status: DC | PRN
  Administered 2021-06-22: 50 mg via ORAL
  Filled 2021-06-22: qty 1

## 2021-06-22 MED ORDER — BUPIVACAINE LIPOSOME 1.3 % IJ SUSP
INTRAMUSCULAR | Status: DC | PRN
  Administered 2021-06-22: 10 mL via PERINEURAL

## 2021-06-22 MED ORDER — DEXAMETHASONE SODIUM PHOSPHATE 4 MG/ML IJ SOLN
INTRAMUSCULAR | Status: DC | PRN
  Administered 2021-06-22: 10 mg via INTRAVENOUS

## 2021-06-22 MED ORDER — SODIUM CHLORIDE 0.9 % IV SOLN
INTRAVENOUS | Status: AC
  Filled 2021-06-22: qty 10

## 2021-06-22 MED ORDER — ATROPINE SULFATE 0.4 MG/ML IV SOLN
INTRAVENOUS | Status: AC
  Filled 2021-06-22: qty 1

## 2021-06-22 MED ORDER — FENTANYL CITRATE (PF) 100 MCG/2ML IJ SOLN
INTRAMUSCULAR | Status: DC | PRN
  Administered 2021-06-22 (×2): 50 ug via INTRAVENOUS

## 2021-06-22 MED ORDER — MIDAZOLAM HCL 2 MG/2ML IJ SOLN
2.0000 mg | Freq: Once | INTRAMUSCULAR | Status: AC
  Administered 2021-06-22: 2 mg via INTRAVENOUS

## 2021-06-22 MED ORDER — DEXAMETHASONE SODIUM PHOSPHATE 10 MG/ML IJ SOLN
INTRAMUSCULAR | Status: AC
  Filled 2021-06-22: qty 1

## 2021-06-22 MED ORDER — FENTANYL CITRATE (PF) 100 MCG/2ML IJ SOLN
50.0000 ug | Freq: Once | INTRAMUSCULAR | Status: AC
  Administered 2021-06-22: 50 ug via INTRAVENOUS

## 2021-06-22 MED ORDER — LACTATED RINGERS IV SOLN: INTRAVENOUS | Status: DC

## 2021-06-22 MED ORDER — LIDOCAINE HCL (CARDIAC) PF 100 MG/5ML IV SOSY
PREFILLED_SYRINGE | INTRAVENOUS | Status: DC | PRN
  Administered 2021-06-22: 20 mg via INTRAVENOUS

## 2021-06-22 MED ORDER — METHOCARBAMOL 500 MG PO TABS
500.0000 mg | ORAL_TABLET | Freq: Four times a day (QID) | ORAL | Status: DC | PRN
  Administered 2021-06-22: 500 mg via ORAL
  Filled 2021-06-22: qty 1

## 2021-06-22 MED ORDER — CEFAZOLIN SODIUM-DEXTROSE 2-4 GM/100ML-% IV SOLN
INTRAVENOUS | Status: AC
  Filled 2021-06-22: qty 100

## 2021-06-22 MED ORDER — 0.9 % SODIUM CHLORIDE (POUR BTL) OPTIME
TOPICAL | Status: DC | PRN
  Administered 2021-06-22: 300 mL

## 2021-06-22 MED ORDER — ONDANSETRON HCL 4 MG/2ML IJ SOLN: 4.0000 mg | Freq: Four times a day (QID) | INTRAMUSCULAR | Status: DC | PRN

## 2021-06-22 MED ORDER — CEFAZOLIN SODIUM-DEXTROSE 1-4 GM/50ML-% IV SOLN
1.0000 g | Freq: Three times a day (TID) | INTRAVENOUS | Status: DC
  Administered 2021-06-22 – 2021-06-23 (×2): 1 g via INTRAVENOUS
  Filled 2021-06-22 (×2): qty 50

## 2021-06-22 MED ORDER — ONDANSETRON HCL 4 MG/2ML IJ SOLN
4.0000 mg | Freq: Once | INTRAMUSCULAR | Status: DC | PRN
Start: 2021-06-22 — End: 2021-06-22

## 2021-06-22 MED ORDER — HYDROMORPHONE HCL 1 MG/ML IJ SOLN: 0.5000 mg | INTRAMUSCULAR | Status: DC | PRN

## 2021-06-22 MED ORDER — SODIUM CHLORIDE 0.9 % IV SOLN
INTRAVENOUS | Status: DC | PRN
  Administered 2021-06-22: 500 mL

## 2021-06-22 MED ORDER — OXYCODONE HCL 5 MG/5ML PO SOLN: 5.0000 mg | Freq: Once | ORAL | Status: DC | PRN

## 2021-06-22 MED ORDER — ONDANSETRON HCL 4 MG/2ML IJ SOLN
INTRAMUSCULAR | Status: AC
  Filled 2021-06-22: qty 2

## 2021-06-22 MED ORDER — HYDROMORPHONE HCL 1 MG/ML IJ SOLN
0.2500 mg | INTRAMUSCULAR | Status: DC | PRN
  Administered 2021-06-22: 0.5 mg via INTRAVENOUS

## 2021-06-22 MED ORDER — PROPOFOL 10 MG/ML IV BOLUS
INTRAVENOUS | Status: DC | PRN
  Administered 2021-06-22: 100 mg via INTRAVENOUS

## 2021-06-22 MED ORDER — PHENYLEPHRINE 80 MCG/ML (10ML) SYRINGE FOR IV PUSH (FOR BLOOD PRESSURE SUPPORT)
PREFILLED_SYRINGE | INTRAVENOUS | Status: AC
  Filled 2021-06-22: qty 10

## 2021-06-22 SURGICAL SUPPLY — 69 items
ALLOGRAFT PERF 16X20 1.6+/-0.4 (Tissue) ×2 IMPLANT
APPLIER CLIP 9.375 MED OPEN (MISCELLANEOUS) ×3
BAG DECANTER FOR FLEXI CONT (MISCELLANEOUS) ×3 IMPLANT
BINDER BREAST LRG (GAUZE/BANDAGES/DRESSINGS) ×1 IMPLANT
BLADE HEX COATED 2.75 (ELECTRODE) ×3 IMPLANT
BLADE SURG 10 STRL SS (BLADE) ×3 IMPLANT
BLADE SURG 15 STRL LF DISP TIS (BLADE) ×2 IMPLANT
BLADE SURG 15 STRL SS (BLADE) ×1
BNDG GAUZE DERMACEA FLUFF (GAUZE/BANDAGES/DRESSINGS) ×2
BNDG GAUZE DERMACEA FLUFF 4 (GAUZE/BANDAGES/DRESSINGS) ×4 IMPLANT
CANISTER SUCT 1200ML W/VALVE (MISCELLANEOUS) ×3 IMPLANT
CHLORAPREP W/TINT 26 (MISCELLANEOUS) ×6 IMPLANT
CLIP APPLIE 9.375 MED OPEN (MISCELLANEOUS) ×2 IMPLANT
COVER BACK TABLE 60X90IN (DRAPES) ×3 IMPLANT
COVER MAYO STAND STRL (DRAPES) ×6 IMPLANT
DERMABOND ADVANCED (GAUZE/BANDAGES/DRESSINGS) ×2
DERMABOND ADVANCED .7 DNX12 (GAUZE/BANDAGES/DRESSINGS) ×4 IMPLANT
DRAIN CHANNEL 15F RND FF W/TCR (WOUND CARE) ×2 IMPLANT
DRAIN CHANNEL 19F RND (DRAIN) ×2 IMPLANT
DRAPE TOP ARMCOVERS (MISCELLANEOUS) ×3 IMPLANT
DRAPE U-SHAPE 76X120 STRL (DRAPES) ×3 IMPLANT
DRAPE UTILITY XL STRL (DRAPES) ×3 IMPLANT
DRSG PAD ABDOMINAL 8X10 ST (GAUZE/BANDAGES/DRESSINGS) ×6 IMPLANT
DRSG TEGADERM 4X10 (GAUZE/BANDAGES/DRESSINGS) ×3 IMPLANT
ELECT BLADE 4.0 EZ CLEAN MEGAD (MISCELLANEOUS) ×3
ELECT COATED BLADE 2.86 ST (ELECTRODE) ×1 IMPLANT
ELECT REM PT RETURN 9FT ADLT (ELECTROSURGICAL) ×3
ELECTRODE BLDE 4.0 EZ CLN MEGD (MISCELLANEOUS) IMPLANT
ELECTRODE REM PT RTRN 9FT ADLT (ELECTROSURGICAL) ×2 IMPLANT
EVACUATOR SILICONE 100CC (DRAIN) ×4 IMPLANT
EXPANDER TISSUE FORTE 300CC (Breast) IMPLANT
GLOVE BIO SURGEON STRL SZ 6 (GLOVE) ×9 IMPLANT
GLOVE BIO SURGEON STRL SZ7 (GLOVE) ×3 IMPLANT
GLOVE BIOGEL PI IND STRL 7.5 (GLOVE) ×2 IMPLANT
GLOVE BIOGEL PI INDICATOR 7.5 (GLOVE) ×1
GOWN STRL REUS W/ TWL LRG LVL3 (GOWN DISPOSABLE) ×4 IMPLANT
GOWN STRL REUS W/TWL LRG LVL3 (GOWN DISPOSABLE) ×4
LIGHT WAVEGUIDE WIDE FLAT (MISCELLANEOUS) ×1 IMPLANT
MARKER SKIN DUAL TIP RULER LAB (MISCELLANEOUS) ×1 IMPLANT
NDL SAFETY ECLIPSE 18X1.5 (NEEDLE) ×2 IMPLANT
NEEDLE HYPO 18GX1.5 SHARP (NEEDLE) ×1
NS IRRIG 1000ML POUR BTL (IV SOLUTION) ×2 IMPLANT
PACK BASIN DAY SURGERY FS (CUSTOM PROCEDURE TRAY) ×3 IMPLANT
PENCIL SMOKE EVACUATOR (MISCELLANEOUS) ×3 IMPLANT
PIN SAFETY STERILE (MISCELLANEOUS) ×3 IMPLANT
SHEET MEDIUM DRAPE 40X70 STRL (DRAPES) ×3 IMPLANT
SLEEVE SCD COMPRESS KNEE MED (STOCKING) ×3 IMPLANT
SPONGE T-LAP 18X18 ~~LOC~~+RFID (SPONGE) ×9 IMPLANT
SPONGE T-LAP 4X18 ~~LOC~~+RFID (SPONGE) ×1 IMPLANT
STAPLER VISISTAT 35W (STAPLE) ×3 IMPLANT
SUT CHROMIC 4 0 PS 2 18 (SUTURE) ×5 IMPLANT
SUT ETHILON 2 0 FS 18 (SUTURE) ×2 IMPLANT
SUT MNCRL AB 4-0 PS2 18 (SUTURE) ×2 IMPLANT
SUT VIC AB 3-0 SH 27 (SUTURE) ×2
SUT VIC AB 3-0 SH 27X BRD (SUTURE) IMPLANT
SUT VICRYL 0 CT-2 (SUTURE) ×3 IMPLANT
SUT VICRYL 3-0 CR8 SH (SUTURE) ×3 IMPLANT
SUT VICRYL 4-0 PS2 18IN ABS (SUTURE) ×1 IMPLANT
SUT VLOC 180 0 24IN GS25 (SUTURE) ×2 IMPLANT
SYR 10ML LL (SYRINGE) ×3 IMPLANT
SYR 50ML LL SCALE MARK (SYRINGE) ×3 IMPLANT
SYR BULB IRRIG 60ML STRL (SYRINGE) ×3 IMPLANT
TISSUE EXPANDER FORTE 300CC (Breast) ×6 IMPLANT
TOWEL GREEN STERILE FF (TOWEL DISPOSABLE) ×6 IMPLANT
TRACER MAGTRACE VIAL (MISCELLANEOUS) ×1 IMPLANT
TRAY DSU PREP LF (CUSTOM PROCEDURE TRAY) ×1 IMPLANT
TUBE CONNECTING 20X1/4 (TUBING) ×3 IMPLANT
UNDERPAD 30X36 HEAVY ABSORB (UNDERPADS AND DIAPERS) ×6 IMPLANT
YANKAUER SUCT BULB TIP NO VENT (SUCTIONS) ×3 IMPLANT

## 2021-06-22 NOTE — Interval H&P Note (Signed)
History and Physical Interval Note:  06/22/2021 9:39 AM  Toni Crawford  has presented today for surgery, with the diagnosis of DUCTAL CARCINOMA IN SITU, RIGHT BREAST BRCA-1 POSITIVE.  The various methods of treatment have been discussed with the patient and family. After consideration of risks, benefits and other options for treatment, the patient has consented to  Procedure(s) with comments: BILATERAL  MASTECTOMIES WITH IMMEDIATE RECONSTRUCTION, MAGTRACE INJECTION RIGHT BREAST (Bilateral) - LMA PEC BLOCK BREAST RECONSTRUCTION WITH PLACEMENT OF TISSUE EXPANDER AND ALLODERM (Bilateral) as a surgical intervention.  The patient's history has been reviewed, patient examined, no change in status, stable for surgery.  I have reviewed the patient's chart and labs.  Questions were answered to the patient's satisfaction.     Irean Hong Shannel Zahm

## 2021-06-22 NOTE — Transfer of Care (Signed)
Immediate Anesthesia Transfer of Care Note  Patient: Toni Crawford  Procedure(s) Performed: BILATERAL  MASTECTOMIES WITH IMMEDIATE RECONSTRUCTION, MAGTRACE INJECTION RIGHT BREAST (Bilateral: Breast) BREAST RECONSTRUCTION WITH PLACEMENT OF TISSUE EXPANDER AND ALLODERM (Bilateral: Chest)  Patient Location: PACU  Anesthesia Type:GA combined with regional for post-op pain  Level of Consciousness: awake, alert , oriented, drowsy and patient cooperative  Airway & Oxygen Therapy: Patient Spontanous Breathing and Patient connected to face mask oxygen  Post-op Assessment: Report given to RN and Post -op Vital signs reviewed and stable  Post vital signs: Reviewed and stable  Last Vitals:  Vitals Value Taken Time  BP    Temp    Pulse 79 06/22/21 1457  Resp    SpO2 100 % 06/22/21 1457  Vitals shown include unvalidated device data.  Last Pain:  Vitals:   06/22/21 0939  TempSrc: Oral         Complications: No notable events documented.

## 2021-06-22 NOTE — Anesthesia Procedure Notes (Signed)
Procedure Name: Intubation Date/Time: 06/22/2021 10:59 AM  Performed by: Willa Frater, CRNAPre-anesthesia Checklist: Patient identified, Emergency Drugs available, Suction available and Patient being monitored Patient Re-evaluated:Patient Re-evaluated prior to induction Oxygen Delivery Method: Circle system utilized Preoxygenation: Pre-oxygenation with 100% oxygen Induction Type: IV induction Ventilation: Mask ventilation without difficulty Laryngoscope Size: Mac and 3 Grade View: Grade I Tube type: Oral Tube size: 7.0 mm Number of attempts: 1 Airway Equipment and Method: Stylet and Oral airway Placement Confirmation: ETT inserted through vocal cords under direct vision, positive ETCO2 and breath sounds checked- equal and bilateral Secured at: 19 cm Tube secured with: Tape Dental Injury: Teeth and Oropharynx as per pre-operative assessment

## 2021-06-22 NOTE — Interval H&P Note (Signed)
History and Physical Interval Note:  06/22/2021 9:30 AM  Toni Crawford  has presented today for surgery, with the diagnosis of DUCTAL CARCINOMA IN SITU, RIGHT BREAST BRCA-1 POSITIVE.  The various methods of treatment have been discussed with the patient and family. After consideration of risks, benefits and other options for treatment, the patient has consented to  Procedure(s) with comments: BILATERAL  MASTECTOMIES WITH IMMEDIATE RECONSTRUCTION, MAGTRACE INJECTION RIGHT BREAST (Bilateral) - LMA PEC BLOCK BREAST RECONSTRUCTION WITH PLACEMENT OF TISSUE EXPANDER AND ALLODERM (Bilateral) as a surgical intervention.  The patient's history has been reviewed, patient examined, no change in status, stable for surgery.  I have reviewed the patient's chart and labs.  Questions were answered to the patient's satisfaction.     Maia Petties

## 2021-06-22 NOTE — Progress Notes (Signed)
Assisted Dr. Doroteo Glassman with pectoralis block. Side rails up, monitors on throughout procedure. See vital signs in flow sheet. Tolerated Procedure well.

## 2021-06-22 NOTE — Anesthesia Preprocedure Evaluation (Addendum)
Anesthesia Evaluation  Patient identified by MRN, date of birth, ID band Patient awake    Reviewed: Allergy & Precautions, NPO status , Patient's Chart, lab work & pertinent test results  Airway Mallampati: II  TM Distance: >3 FB Neck ROM: Full    Dental  (+) Teeth Intact, Dental Advisory Given   Pulmonary neg pulmonary ROS,    Pulmonary exam normal breath sounds clear to auscultation       Cardiovascular negative cardio ROS Normal cardiovascular exam Rhythm:Regular Rate:Normal     Neuro/Psych negative neurological ROS  negative psych ROS   GI/Hepatic negative GI ROS, Neg liver ROS,   Endo/Other  negative endocrine ROS  Renal/GU negative Renal ROS  negative genitourinary   Musculoskeletal negative musculoskeletal ROS (+)   Abdominal   Peds  Hematology negative hematology ROS (+)   Anesthesia Other Findings R breast ca  Reproductive/Obstetrics negative OB ROS                            Anesthesia Physical Anesthesia Plan  ASA: 2  Anesthesia Plan: General and Regional   Post-op Pain Management: Tylenol PO (pre-op)* and Regional block*   Induction: Intravenous  PONV Risk Score and Plan: 3 and Ondansetron, Dexamethasone, Midazolam and Treatment may vary due to age or medical condition  Airway Management Planned: Oral ETT  Additional Equipment: None  Intra-op Plan:   Post-operative Plan: Extubation in OR  Informed Consent: I have reviewed the patients History and Physical, chart, labs and discussed the procedure including the risks, benefits and alternatives for the proposed anesthesia with the patient or authorized representative who has indicated his/her understanding and acceptance.     Dental advisory given  Plan Discussed with: CRNA  Anesthesia Plan Comments:        Anesthesia Quick Evaluation

## 2021-06-22 NOTE — Anesthesia Procedure Notes (Signed)
Anesthesia Regional Block: Pectoralis block   Pre-Anesthetic Checklist: , timeout performed,  Correct Patient, Correct Site, Correct Laterality,  Correct Procedure, Correct Position, site marked,  Risks and benefits discussed,  Surgical consent,  Pre-op evaluation,  At surgeon's request and post-op pain management  Laterality: Left and Right  Prep: Maximum Sterile Barrier Precautions used, chloraprep       Needles:  Injection technique: Single-shot  Needle Type: Echogenic Stimulator Needle     Needle Length: 9cm  Needle Gauge: 22     Additional Needles:   Procedures:,,,, ultrasound used (permanent image in chart),,    Narrative:  Start time: 06/22/2021 9:55 AM End time: 06/22/2021 10:05 AM Injection made incrementally with aspirations every 5 mL.  Performed by: Personally  Anesthesiologist: Pervis Hocking, DO  Additional Notes: Monitors applied. No increased pain on injection. No increased resistance to injection. Injection made in 5cc increments. Good needle visualization. Patient tolerated procedure well.

## 2021-06-22 NOTE — Discharge Instructions (Addendum)
Information for Discharge Teaching: EXPAREL (bupivacaine liposome injectable suspension)   Your surgeon or anesthesiologist gave you EXPAREL(bupivacaine) to help control your pain after surgery.  EXPAREL is a local anesthetic that provides pain relief by numbing the tissue around the surgical site. EXPAREL is designed to release pain medication over time and can control pain for up to 72 hours. Depending on how you respond to EXPAREL, you may require less pain medication during your recovery.  Possible side effects: Temporary loss of sensation or ability to move in the area where bupivacaine was injected. Nausea, vomiting, constipation Rarely, numbness and tingling in your mouth or lips, lightheadedness, or anxiety may occur. Call your doctor right away if you think you may be experiencing any of these sensations, or if you have other questions regarding possible side effects.  Follow all other discharge instructions given to you by your surgeon or nurse. Eat a healthy diet and drink plenty of water or other fluids.  If you return to the hospital for any reason within 96 hours following the administration of EXPAREL, it is important for health care providers to know that you have received this anesthetic. A teal colored band has been placed on your arm with the date, time and amount of EXPAREL you have received in order to alert and inform your health care providers. Please leave this armband in place for the full 96 hours following administration, and then you may remove the band.  About my Jackson-Pratt Bulb Drain  What is a Jackson-Pratt bulb? A Jackson-Pratt is a soft, round device used to collect drainage. It is connected to a long, thin drainage catheter, which is held in place by one or two small stiches near your surgical incision site. When the bulb is squeezed, it forms a vacuum, forcing the drainage to empty into the bulb.  Emptying the Jackson-Pratt bulb- To empty the bulb: 1.  Release the plug on the top of the bulb. 2. Pour the bulb's contents into a measuring container which your nurse will provide. 3. Record the time emptied and amount of drainage. Empty the drain(s) as often as your     doctor or nurse recommends.  Date                  Time                    Amount (Drain 1)                 Amount (Drain 2)  _____________________________________________________________________  _____________________________________________________________________  _____________________________________________________________________  _____________________________________________________________________  _____________________________________________________________________  _____________________________________________________________________  _____________________________________________________________________  _____________________________________________________________________  Squeezing the Jackson-Pratt Bulb- To squeeze the bulb: 1. Make sure the plug at the top of the bulb is open. 2. Squeeze the bulb tightly in your fist. You will hear air squeezing from the bulb. 3. Replace the plug while the bulb is squeezed. 4. Use a safety pin to attach the bulb to your clothing. This will keep the catheter from     pulling at the bulb insertion site.  When to call your doctor- Call your doctor if: Drain site becomes red, swollen or hot. You have a fever greater than 101 degrees F. There is oozing at the drain site. Drain falls out (apply a guaze bandage over the drain hole and secure it with tape). Drainage increases daily not related to activity patterns. (You will usually have more drainage when you are active than when you are resting.) Drainage has a bad  odor.

## 2021-06-22 NOTE — Op Note (Signed)
Preop diagnosis: DCIS right breast, BRCA1 positive Postop diagnosis: Same Procedure performed: Left risk reducing nipple sparing mastectomy, right nipple sparing mastectomy, mag trace injection Surgeon:Avid Guillette K Rosan Calbert Co-surgeon: Dr. Irene Limbo Anesthesia: General via LMA with pectoralis blocks Indications:This is a healthy 46 year old [place] female with no family history of breast cancer who presents after recent screening mammogram revealed some asymmetry in the retroareolar right breast.  She underwent further work-up which revealed an area of DCIS measuring 2.7 x 2.3 x 1.1 cm at 0300 1 cmfn.   She was seen previously and we had planned a lumpectomy with radioactive seed localization.  However, genetic testing revealed a BRCA-1 mutation.  She returns to discuss her surgical options.  She is accompanied by her husband and her younger sister.  A telephone interpreter is used. After thorough discussion, she is opted for bilateral nipple sparing mastectomies.  Description of procedure: The patient is brought to the operating room and placed in supine position on the operating room table.  After an adequate level of general anesthesia was obtained, a Foley catheter was placed under sterile technique.  Her entire chest was prepped with ChloraPrep and draped in sterile fashion.  A timeout was taken to ensure the proper patient and proper procedure.  We began on the left side.  We made a transverse inframammary crease incision.  We dissected on the chest wall.  We then dissected the breast tissue off of the pectoralis muscle posteriorly.  Our margins were the lateral edge of the sternum, the anterior edge of the latissimus laterally, and the infraclavicular chest wall superiorly.  Once we had completed our posterior dissection we began our anterior dissection.  We dissected the skin and dermis along with a very thin layer of breast tissue off of the anterior surface of the breast specimen.  Once we  completed our entire dissection the specimen was removed.  It was oriented with a long suture lateral, a short stitch superior, and a double stitch at the nipple areolar complex.  This was sent for pathologic examination.  We then performed a separate biopsy of the posterior surface of the nipple.  This was sent separately as the left nipple biopsy.  We irrigated thoroughly and inspected for hemostasis.  We then turned our attention to the right side.  We made a transverse inframammary crease incision.  We dissected on the chest wall.  We then dissected the breast tissue off of the pectoralis muscle posteriorly.  Our margins were the lateral edge of the sternum medially, the anterior edge of the latissimus laterally, and the infraclavicular chest wall superiorly.  Once we had completed our posterior dissection we began our anterior dissection.  We dissected the skin and dermis along with a very thin layer of breast tissue off of the anterior surface of the breast specimen.  Once we completed our entire dissection the specimen was removed.  It was oriented with a long suture lateral, a short stitch superior, and a double stitch at the nipple areolar complex.  This was sent for pathologic examination.  We then performed a separate biopsy of the posterior surface of the nipple.  This was sent separately as the right nipple biopsy.  We irrigated thoroughly and inspected for hemostasis.    The case was then turned over to Dr. Iran Planas for reconstruction.  Imogene Burn. Georgette Dover, MD, [facility] Surgery  General Surgery   06/22/2021 1:26 PM

## 2021-06-22 NOTE — Anesthesia Postprocedure Evaluation (Signed)
Anesthesia Post Note  Patient: Toni Crawford  Procedure(s) Performed: BILATERAL  MASTECTOMIES WITH IMMEDIATE RECONSTRUCTION, MAGTRACE INJECTION RIGHT BREAST (Bilateral: Breast) BREAST RECONSTRUCTION WITH PLACEMENT OF TISSUE EXPANDER AND ALLODERM (Bilateral: Chest)     Patient location during evaluation: PACU Anesthesia Type: General Level of consciousness: awake and alert Pain management: pain level controlled Vital Signs Assessment: post-procedure vital signs reviewed and stable Respiratory status: spontaneous breathing, nonlabored ventilation, respiratory function stable and patient connected to nasal cannula oxygen Cardiovascular status: blood pressure returned to baseline and stable Postop Assessment: no apparent nausea or vomiting Anesthetic complications: no   No notable events documented.  Last Vitals:  Vitals:   06/22/21 1519 06/22/21 1530  BP:  128/84  Pulse: 74 68  Resp: 17 11  Temp:    SpO2: 98% 94%    Last Pain:  Vitals:   06/22/21 1530  TempSrc:   PainSc: 3                  [place] Decoteau

## 2021-06-23 ENCOUNTER — Encounter (HOSPITAL_BASED_OUTPATIENT_CLINIC_OR_DEPARTMENT_OTHER): Payer: Self-pay | Admitting: Surgery

## 2021-06-23 DIAGNOSIS — D0511 Intraductal carcinoma in situ of right breast: Secondary | ICD-10-CM | POA: Diagnosis not present

## 2021-06-26 LAB — SURGICAL PATHOLOGY

## 2021-06-28 ENCOUNTER — Ambulatory Visit: Payer: Medicaid Other | Admitting: Internal Medicine

## 2021-06-29 ENCOUNTER — Inpatient Hospital Stay: Payer: Medicaid Other

## 2021-06-29 ENCOUNTER — Inpatient Hospital Stay: Payer: Medicaid Other | Admitting: Hematology and Oncology

## 2021-07-17 ENCOUNTER — Encounter: Payer: Self-pay | Admitting: *Deleted

## 2021-07-20 ENCOUNTER — Inpatient Hospital Stay: Payer: Medicaid Other | Attending: Hematology and Oncology | Admitting: Hematology and Oncology

## 2021-07-20 ENCOUNTER — Other Ambulatory Visit: Payer: Self-pay

## 2021-07-20 ENCOUNTER — Inpatient Hospital Stay: Payer: Medicaid Other

## 2021-07-20 DIAGNOSIS — Z9013 Acquired absence of bilateral breasts and nipples: Secondary | ICD-10-CM | POA: Diagnosis not present

## 2021-07-20 DIAGNOSIS — D0511 Intraductal carcinoma in situ of right breast: Secondary | ICD-10-CM | POA: Diagnosis not present

## 2021-07-20 DIAGNOSIS — Z1509 Genetic susceptibility to other malignant neoplasm: Secondary | ICD-10-CM | POA: Insufficient documentation

## 2021-07-20 DIAGNOSIS — Z1501 Genetic susceptibility to malignant neoplasm of breast: Secondary | ICD-10-CM | POA: Insufficient documentation

## 2021-07-20 DIAGNOSIS — Z800 Family history of malignant neoplasm of digestive organs: Secondary | ICD-10-CM | POA: Insufficient documentation

## 2021-07-20 DIAGNOSIS — Z8041 Family history of malignant neoplasm of ovary: Secondary | ICD-10-CM | POA: Insufficient documentation

## 2021-07-20 NOTE — Assessment & Plan Note (Addendum)
06/22/2021:Bilateral mastectomies Left mastectomy: Fibrocystic changes Right mastectomy: DCIS with clear margins grade 2 ER 100% PR 15%  DCIS counseling: I discussed with her the clinical significance of DCIS and how it differs from invasive breast cancer. Having had bilateral mastectomies there is no further treatment necessary.  She does not need any other follow-ups with [place] and she can be seen on an as-needed basis.

## 2021-07-20 NOTE — Assessment & Plan Note (Signed)
BRCA1 mutation: I discussed with the patient that BRCA1 is a tumor suppressor gene which helps repair damaged DNA. In patients with BRCA1 or 2 mutations, the damaged DNA could not be repaired properly increasing the risk of cancers. These mutations are inherited in autosomal dominant fashion and hence 50% probability that their children may have a BRCA mutation.  Cancer risk:  Breast - 72 percent Ovarian - 44 percent  Other gynecological malignancies: 1.  Fallopian tube cancers: 0.6% 2. primary peritoneal cancer: 1.3% 3.  Endometrial cancer: 1.9% 4.  Pancreatic cancer: 1% 5.  Prostate cancer sent female: 9% Slight increase in colorectal cancers, stomach and biliary cancers have been reported although the absolute risk has not been quantified.  BRCA1 patients are not at risk for melanoma.    We will refer the patient to Dr. Berline Lopes for oophorectomy.

## 2021-07-20 NOTE — Progress Notes (Signed)
[place] CONSULT NOTE  Patient Care Team: Theodoro Clock as PCP - General (Physician Assistant) Mauro Kaufmann, RN as Oncology Nurse Navigator Rockwell Germany, RN as Oncology Nurse Navigator  CHIEF COMPLAINTS/PURPOSE OF CONSULTATION:  DCIS, BRCA1 mutation  HISTORY OF PRESENTING ILLNESS:  Toni Crawford 46 y.o. female is here because of recent diagnosis of right breast DCIS.  She underwent bilateral mastectomies when she found that she had BRCA1 mutation.  She is healing and recovering very well from recent surgery.  She was also diagnosed with BRCA1 mutation and genetic counselors.  I reviewed her records extensively and collaborated the history with the patient.  SUMMARY OF ONCOLOGIC HISTORY: Oncology History  Ductal carcinoma in situ (DCIS) of right breast  04/11/2021 Initial Diagnosis   Screening mammogram detected right breast 2.7 cm indeterminate mass with calcifications, biopsy revealed low-grade DCIS with a focus of intermediate grade DCIS with necrosis involving intraductal papilloma, ER 100%, PR 15%   06/22/2021 Surgery   Bilateral mastectomies Left mastectomy: Fibrocystic changes Right mastectomy: DCIS with clear margins grade 2 ER 100% PR 15%      MEDICAL HISTORY:  Past Medical History:  Diagnosis Date   DCIS (ductal carcinoma in situ)    Family history of colon cancer    Family history of ovarian cancer    Gestational diabetes    glyburide   Heart murmur    told with pregnancy--no symptoms   Tuberculosis    "when I was young" states she received treatment    SURGICAL HISTORY: Past Surgical History:  Procedure Laterality Date   APPENDECTOMY     BREAST RECONSTRUCTION WITH PLACEMENT OF TISSUE EXPANDER AND ALLODERM Bilateral 06/22/2021   Procedure: BREAST RECONSTRUCTION WITH PLACEMENT OF TISSUE EXPANDER AND ALLODERM;  Surgeon: Irene Limbo, MD;  Location: [facility];  Service: Plastics;  Laterality: Bilateral;   SIMPLE  MASTECTOMY WITH AXILLARY SENTINEL NODE BIOPSY Bilateral 06/22/2021   Procedure: BILATERAL  MASTECTOMIES WITH IMMEDIATE RECONSTRUCTION, MAGTRACE INJECTION RIGHT BREAST;  Surgeon: Donnie Mesa, MD;  Location: [facility];  Service: General;  Laterality: Bilateral;  LMA PEC BLOCK   UPPER GASTROINTESTINAL ENDOSCOPY      SOCIAL HISTORY: Social History   Socioeconomic History   Marital status: Married    Spouse name: Shanon Brow   Number of children: 4   Years of education: Not on file   Highest education level: 10th grade  Occupational History   Not on file  Tobacco Use   Smoking status: Never    Passive exposure: Past   Smokeless tobacco: Former    Types: Snuff, Chew  Vaping Use   Vaping Use: Never used  Substance and Sexual Activity   Alcohol use: No   Drug use: No   Sexual activity: Yes    Birth control/protection: None  Other Topics Concern   Not on file  Social History Narrative   Not on file   Social Determinants of Health   Financial Resource Strain: Not on file  Food Insecurity: Not on file  Transportation Needs: No Transportation Needs (03/20/2018)   PRAPARE - Hydrologist (Medical): No    Lack of Transportation (Non-Medical): No  Physical Activity: Not on file  Stress: Not on file  Social Connections: Not on file  Intimate Partner Violence: Not on file    FAMILY HISTORY: Family History  Problem Relation Age of Onset   Cancer Mother  uterus   Ovarian cancer Mother 52   Diabetes Father    Kidney disease Father    Colon cancer Sister 69   Diabetes Sister    Diabetes Sister    Colon cancer Maternal Uncle    Esophageal cancer Neg Hx    Rectal cancer Neg Hx    Stomach cancer Neg Hx     ALLERGIES:  has No Known Allergies.  MEDICATIONS:  Current Outpatient Medications  Medication Sig Dispense Refill   cholecalciferol (VITAMIN D3) 25 MCG (1000 UT) tablet Take 1,000 Units by mouth daily.     Ferrous Sulfate  (IRON) 325 (65 Fe) MG TABS Take 2 tablets by mouth daily in the afternoon.     No current facility-administered medications for this visit.    REVIEW OF SYSTEMS:   Constitutional: Denies fevers, chills or abnormal night sweats Breast: Bilateral mastectomies with reconstruction All other systems were reviewed with the patient and are negative.  PHYSICAL EXAMINATION: ECOG PERFORMANCE STATUS: 1 - Symptomatic but completely ambulatory  Vitals:   07/20/21 1302  BP: 110/80  Pulse: (!) 101  Resp: 17  Temp: 97.9 F (36.6 C)  SpO2: 96%   Filed Weights   07/20/21 1302  Weight: 122 lb 12.8 oz (55.7 kg)    GENERAL:alert, no distress and comfortable  LABORATORY DATA:  I have reviewed the data as listed Lab Results  Component Value Date   WBC 5.1 05/04/2021   HGB 12.2 05/04/2021   HCT 37.9 05/04/2021   MCV 93.6 05/04/2021   PLT 191 05/04/2021   Lab Results  Component Value Date   NA 141 05/04/2021   K 3.7 05/04/2021   CL 109 05/04/2021   CO2 27 05/04/2021    RADIOGRAPHIC STUDIES: I have personally reviewed the radiological reports and agreed with the findings in the report.  ASSESSMENT AND PLAN:  Ductal carcinoma in situ (DCIS) of right breast 06/22/2021:Bilateral mastectomies Left mastectomy: Fibrocystic changes Right mastectomy: DCIS with clear margins grade 2 ER 100% PR 15%  DCIS counseling: I discussed with her the clinical significance of DCIS and how it differs from invasive breast cancer. Having had bilateral mastectomies there is no further treatment necessary.  She does not need any other follow-ups with [place] and she can be seen on an as-needed basis.  BRCA1 gene mutation positive BRCA1 mutation: I discussed with the patient that BRCA1 is a tumor suppressor gene which helps repair damaged DNA. In patients with BRCA1 or 2 mutations, the damaged DNA could not be repaired properly increasing the risk of cancers. These mutations are inherited in autosomal dominant  fashion and hence 50% probability that their children may have a BRCA mutation.  Cancer risk:  Breast - 72 percent Ovarian - 44 percent  Other gynecological malignancies: 1.  Fallopian tube cancers: 0.6% 2. primary peritoneal cancer: 1.3% 3.  Endometrial cancer: 1.9% 4.  Pancreatic cancer: 1% 5.  Prostate cancer sent female: 9% Slight increase in colorectal cancers, stomach and biliary cancers have been reported although the absolute risk has not been quantified.  BRCA1 patients are not at risk for melanoma.    We will refer the patient to Dr. Berline Lopes for oophorectomy.   All questions were answered. The patient knows to call the clinic with any problems, questions or concerns.    Harriette Ohara, MD 07/20/21

## 2021-07-21 ENCOUNTER — Encounter: Payer: Self-pay | Admitting: *Deleted

## 2021-07-21 DIAGNOSIS — D0511 Intraductal carcinoma in situ of right breast: Secondary | ICD-10-CM

## 2021-08-02 ENCOUNTER — Telehealth: Payer: Self-pay

## 2021-08-02 ENCOUNTER — Other Ambulatory Visit: Payer: Self-pay | Admitting: *Deleted

## 2021-08-02 DIAGNOSIS — Z1501 Genetic susceptibility to malignant neoplasm of breast: Secondary | ICD-10-CM

## 2021-08-02 NOTE — Telephone Encounter (Signed)
Received call from patients husband Shanon Brow following up on Dr. Geralyn Flash referral to Dr. Berline Lopes for his wife [place]. Per Dr. Geralyn Flash note on 07/20/21 scheduled patient, she has an appointment scheduled with Dr. Berline Lopes on 09/07/21 at 0900. Patient agrees to date and time. She has been provided with office address and location. She is also aware of our mask and visitor policy. Patient verbalized understanding and will call with any questions.

## 2021-09-01 ENCOUNTER — Ambulatory Visit: Payer: Medicaid Other | Admitting: Gynecologic Oncology

## 2021-09-06 NOTE — Progress Notes (Signed)
GYNECOLOGIC ONCOLOGY NEW PATIENT CONSULTATION   Patient Name: Toni Crawford  Patient Age: 46 y.o. 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  Patient comes in with her husband today.  The visit is conducted with the help of a video interpreter.  The risk of ovarian cancer in patients with BRCA 1 mutations is approximately 40% by the age of 72.  In addition, patients with BRCA mutations are also at increased risk of breast cancer, and an increased (but still low) risk of pancreatic cancer and melanoma.  The [employer]  (NCCN) recommends removal of bilateral ovaries and fallopian tubes between the ages of 66-40, and/or when childbearing is completed, to reduce the risk of ovarian and fallopian tube cancer.  If a patient does not undergo risk-reducing surgery, it is recommended they have CA-125 serum levels and pelvic ultrasounds every 6 months as a screening approach, although these are not particularly sensitive methods of screening.  Preventive surgery to remove the ovaries and fallopian tubes significantly reduces the risk of a related cancer in women who carry a BRCA1 or BRCA2 mutation.  Women who undergo preventive surgery retain a 4% risk of developing cancer of the peritoneum.   We also discussed the pros and cons of removing the uterus. There is retrospective data that identifies an association with developing serous endometrial cancer in patients with a BRCA1 mutation. The risk of developing serous endometrial cancer in BRCA1 patients appears to be around 2% with relative risk increase near 20x.  We reviewed the limitations of retrospective data.  The patient is currently on tamoxifen but may be transition to an aromatase inhibitor after her BSO.  If she were to  stay on tamoxifen after her BSO, we discussed the three-fold increased risk of uterine cancer in women with breast cancer who are treated with Tamoxifen.   After this discussion, Toni Crawford would like to move forward with scheduling risk-reducing surgery.  She is interested in total hysterectomy at the time of bilateral salpingo-oophorectomy.  Patient is scheduled for her final breast surgery next week.  I have recommended that we plan for surgery 6-8 weeks after this upcoming surgery.  We discussed the risk of finding occult cancer at the time of risk-reducing surgery, estimated to be up to approximately 5%.  Given several month delay before surgery, I recommend that we proceed with pelvic ultrasound and CA-125.  If there were any findings concerning for cancer at the time of her surgery, I would plan to send the adnexa for frozen section.  The goal would be to do her staging surgery at the time of her risk-reducing surgery if cancer encountered.  We discussed the additional staging procedures including peritoneal biopsies, omentectomy, and possible lymph node removal.  Since patient's primary care provider has been performing her pelvic exams, I am unable to see any Pap smear history since 2015.  Pap and HPV were collected today.  My office will reach out to her PCP to see if she has had cervical cancer screening recently.  If she has not and is due, we will send the Pap and HPV testing today.  Otherwise, the specimen will be thrown out.  We discussed the plan for a robotic assisted hysterectomy, bilateral salpingo-oophorectomy, possible staging, possible laparotomy on 11/16. The risks of surgery were discussed in detail  and she understands these to include infection; wound separation; hernia; vaginal cuff separation, injury to adjacent organs such as bowel, bladder, blood vessels, ureters and nerves; bleeding which may require blood transfusion; anesthesia risk; thromboembolic events; possible death;  unforeseen complications; possible need for re-exploration; medical complications such as heart attack, stroke, pleural effusion and pneumonia; and, if full lymphadenectomy is performed the risk of lymphedema and lymphocyst. The patient will receive DVT and antibiotic prophylaxis as indicated. She voiced a clear understanding. She had the opportunity to ask questions.   The patient will return closer to the date of surgery for a preoperative visit with [facility].  A copy of this note was sent to the patient's referring provider.   60 minutes of total time was spent for this patient encounter, including preparation, face-to-face counseling with the patient and coordination of care, and documentation of the encounter.  Jeral Pinch, MD  Division of Gynecologic Oncology  Department of Obstetrics and Gynecology  [facility]  ___________________________________________  Chief Complaint: No chief complaint on file.   History of Present Illness:  Toni Crawford is a 46 y.o. y.o. female who is seen in consultation at the request of Dr. Lindi Adie for an evaluation of risk-reducing surgery in the setting of a BRCA1 mutation.  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  She underwent bilateral mastectomies in June with the DCIS, grade 2, ER+.   Genetic testing performed in May of this year revealed a likely pathogenic variant in BRCA1, [id number]. Genetic testing also identified 4 variants of uncertain significance (VUS) - one in the AXIN2 gene called p.C241G, a second in the CDKN2A gene called p.V115L, a third in the [facility] gene called p.F198L and a fourth in the [place] gene called p.A264T  Patient reports that she has healed well from surgery.  She is scheduled for removal of her tissue expanders with placement of bilateral breast implants next week on 9/15.  She has been on tamoxifen since  June.  She is tolerating this well without significant side effects.  She denies any pelvic pain or cramping.  She endorses regular menses although has not had any bleeding for the last 2 months.  She denies any intermenstrual bleeding.  She is not currently using birth control.  She endorses a good appetite without nausea or emesis.  Reports regular bowel and bladder function.  She endorses a long history of getting fatigued easily.  Treatment History: Oncology History  Ductal carcinoma in situ (DCIS) of right breast  04/11/2021 Initial Diagnosis   Screening mammogram detected right breast 2.7 cm indeterminate mass with calcifications, biopsy revealed low-grade DCIS with a focus of intermediate grade DCIS with necrosis involving intraductal papilloma, ER 100%, PR 15%   06/22/2021 Surgery   Bilateral mastectomies Left mastectomy: Fibrocystic changes Right mastectomy: DCIS with clear margins grade 2 ER 100% PR 15%    PAST MEDICAL HISTORY:  Past Medical History:  Diagnosis Date   DCIS (ductal carcinoma in situ)    Family history of colon cancer    Family history of ovarian cancer    Gestational diabetes    glyburide   Heart murmur    told with pregnancy--no symptoms   Tuberculosis    "when I was young" states she received treatment     PAST SURGICAL HISTORY:  Past Surgical History:  Procedure Laterality Date   APPENDECTOMY     BREAST  RECONSTRUCTION WITH PLACEMENT OF TISSUE EXPANDER AND ALLODERM Bilateral 06/22/2021   Procedure: BREAST RECONSTRUCTION WITH PLACEMENT OF TISSUE EXPANDER AND ALLODERM;  Surgeon: Irene Limbo, MD;  Location: [place];  Service: Plastics;  Laterality: Bilateral;   SIMPLE MASTECTOMY WITH AXILLARY SENTINEL NODE BIOPSY Bilateral 06/22/2021   Procedure: BILATERAL  MASTECTOMIES WITH IMMEDIATE RECONSTRUCTION, MAGTRACE INJECTION RIGHT BREAST;  Surgeon: Donnie Mesa, MD;  Location: [place];  Service: General;  Laterality:  Bilateral;  LMA PEC BLOCK   UPPER GASTROINTESTINAL ENDOSCOPY      OB/GYN HISTORY:  OB History  Gravida Para Term Preterm AB Living  '6 5 5   1 4  ' SAB IAB Ectopic Multiple Live Births  1       5    # Outcome Date GA Lbr Len/2nd Weight Sex Delivery Anes PTL Lv  6 Term 08/24/13 [redacted]w[redacted]d/ 00:10 7 lb 4 oz (3.289 kg) F Vag-Spont EPI  LIV  5 SAB 2011          4 Term 2009 447w0d M Vag-Spont   LIV  3 Term 2006 [id number] Vag-Spont None  LIV  2 Term 2000 40w78w0d Vag-Spont   DEC     Birth Comments: Died as a results of an accident-10 months  1 Term [id number]-Spont None  LIV    No LMP recorded.  Age at menarche: 14 Ag65at menopause: Not applicable Hx of HRT: Currently on tamoxifen. Hx of birth control: Has previously used OCPs intermittently between pregnancies.  Was on Depo-Provera for some time and had a Nexplanon for 3 years. Hx of STDs: Denies Last pap: Per patient report, was earlier this year with her primary care provider History of abnormal pap smears: Denies  SCREENING STUDIES:  Last mammogram: 2023  Last colonoscopy: Had upper endoscopy in 02/2021 for work-up of epigastric abdominal pain and iron deficiency anemia.  MEDICATIONS: Outpatient Encounter Medications as of 09/07/2021  Medication Sig   cholecalciferol (VITAMIN D3) 25 MCG (1000 UT) tablet Take 1,000 Units by mouth daily.   Ferrous Sulfate (IRON) 325 (65 Fe) MG TABS Take 2 tablets by mouth daily in the afternoon.   No facility-administered encounter medications on file as of 09/07/2021.    ALLERGIES:  No Known Allergies   FAMILY HISTORY:  Family History  Problem Relation Age of Onset   Uterine cancer Mother    Diabetes Father    Kidney disease Father    Diabetes Sister    Uterine cancer Sister    Diabetes Sister    Colon cancer Maternal Uncle    Esophageal cancer Neg Hx    Rectal cancer Neg Hx    Stomach cancer Neg Hx      SOCIAL HISTORY:  Social Connections: Not on file    REVIEW OF  SYSTEMS:  + Upper abdominal pain, menstrual changes, low back pain intermittently, some numbness of her feet while sleeping or standing for too long. Denies appetite changes, fevers, chills, fatigue, unexplained weight changes. Denies hearing loss, neck lumps or masses, mouth sores, ringing in ears or voice changes. Denies cough or wheezing.  Denies shortness of breath. Denies chest pain or palpitations. Denies leg swelling. Denies abdominal distention, blood in stools, constipation, diarrhea, nausea, vomiting, or early satiety. Denies pain with intercourse, dysuria, frequency, hematuria or incontinence. Denies hot flashes, pelvic pain, or vaginal discharge.   Denies joint pain or muscle pain/cramps. Denies itching, rash, or wounds. Denies  dizziness, headaches, or seizures. Denies swollen lymph nodes or glands, denies easy bruising or bleeding. Denies anxiety, depression, confusion, or decreased concentration.  Physical Exam:  Vital Signs for this encounter:  Blood pressure 107/70, pulse 79, temperature 99.5 F (37.5 C), temperature source Oral, resp. rate 16, height 5' 2.01" (1.575 m), weight 127 lb 3.2 oz (57.7 kg), SpO2 97 %. Body mass index is 23.26 kg/m. General: Alert, oriented, no acute distress.  HEENT: Normocephalic, atraumatic. Sclera anicteric.  Chest: Clear to auscultation bilaterally. No wheezes, rhonchi, or rales. Cardiovascular: Regular rate and rhythm, no murmurs, rubs, or gallops.  Abdomen: Laxity of the anterior abdominal wall.  Normoactive bowel sounds. Soft, nondistended, nontender to palpation. No masses or hepatosplenomegaly appreciated. No palpable fluid wave.  Well-healed right lower quadrant incision. Extremities: Grossly normal range of motion. Warm, well perfused. No edema bilaterally.  Skin: No rashes or lesions.  Lymphatics: No cervical, supraclavicular, or inguinal adenopathy.  GU:  Normal external female genitalia. No lesions. No discharge or bleeding.              Bladder/urethra:  No lesions or masses, well supported bladder             Vagina: Well rugated, no lesions or masses.             Cervix: Normal appearing, no lesions.  Ectropion noted on the inferior face of the cervix.  HPV and Pap collected.             Uterus: Small, mobile, no parametrial involvement or nodularity.  Retroverted uterus.               Adnexa: No masses appreciated.  Rectal: Deferred.  LABORATORY AND RADIOLOGIC DATA:  Outside medical records were reviewed to synthesize the above history, along with the history and physical obtained during the visit.   Lab Results  Component Value Date   WBC 5.1 05/04/2021   HGB 12.2 05/04/2021   HCT 37.9 05/04/2021   PLT 191 05/04/2021   GLUCOSE 141 (H) 05/04/2021   NA 141 05/04/2021   K 3.7 05/04/2021   CL 109 05/04/2021   CREATININE 0.63 05/04/2021   BUN 7 05/04/2021   CO2 27 05/04/2021

## 2021-09-07 ENCOUNTER — Encounter (HOSPITAL_BASED_OUTPATIENT_CLINIC_OR_DEPARTMENT_OTHER): Payer: Self-pay | Admitting: Plastic Surgery

## 2021-09-07 ENCOUNTER — Telehealth: Payer: Self-pay | Admitting: *Deleted

## 2021-09-07 ENCOUNTER — Encounter: Payer: Self-pay | Admitting: Gynecologic Oncology

## 2021-09-07 ENCOUNTER — Other Ambulatory Visit: Payer: Self-pay

## 2021-09-07 ENCOUNTER — Inpatient Hospital Stay: Payer: Medicaid Other

## 2021-09-07 ENCOUNTER — Telehealth: Payer: Self-pay

## 2021-09-07 ENCOUNTER — Inpatient Hospital Stay: Payer: Medicaid Other | Attending: Hematology and Oncology | Admitting: Gynecologic Oncology

## 2021-09-07 VITALS — BP 107/70 | HR 79 | Temp 99.5°F | Resp 16 | Ht 62.01 in | Wt 127.2 lb

## 2021-09-07 DIAGNOSIS — Z9013 Acquired absence of bilateral breasts and nipples: Secondary | ICD-10-CM | POA: Insufficient documentation

## 2021-09-07 DIAGNOSIS — Z148 Genetic carrier of other disease: Secondary | ICD-10-CM | POA: Insufficient documentation

## 2021-09-07 DIAGNOSIS — Z79810 Long term (current) use of selective estrogen receptor modulators (SERMs): Secondary | ICD-10-CM | POA: Diagnosis not present

## 2021-09-07 DIAGNOSIS — D0511 Intraductal carcinoma in situ of right breast: Secondary | ICD-10-CM | POA: Insufficient documentation

## 2021-09-07 DIAGNOSIS — Z1501 Genetic susceptibility to malignant neoplasm of breast: Secondary | ICD-10-CM | POA: Insufficient documentation

## 2021-09-07 DIAGNOSIS — Z1509 Genetic susceptibility to other malignant neoplasm: Secondary | ICD-10-CM | POA: Insufficient documentation

## 2021-09-07 DIAGNOSIS — Z170 Estrogen receptor positive status [ER+]: Secondary | ICD-10-CM | POA: Insufficient documentation

## 2021-09-07 DIAGNOSIS — Z7251 High risk heterosexual behavior: Secondary | ICD-10-CM

## 2021-09-07 DIAGNOSIS — Z1502 Genetic susceptibility to malignant neoplasm of ovary: Secondary | ICD-10-CM | POA: Insufficient documentation

## 2021-09-07 LAB — PREGNANCY, URINE: Preg Test, Ur: NEGATIVE

## 2021-09-07 NOTE — Patient Instructions (Addendum)
We will tentatively plan for surgery on November 16, 2021 at [facility] with Dr. Jeral Pinch. We will see you back in the office closer to the date for a preop appointment with Joylene John NP to discuss the instrcutions for before and after surgery.  You may also receive a phone call from the hospital to arrange for a pre-op appointment there as well. Usually both appointments can be combined on the same day.   Dr. Berline Lopes has ordered an ultrasound along with CA 125 level. Plan to have this prior to your upcoming breast surgery. Given the fact you have not had a period for the past two months and you are not using birth control, we would recommend a pregnancy test.

## 2021-09-07 NOTE — Telephone Encounter (Signed)
Called patient using [employer] ([place]). Relayed that Urine Pregnancy test was negative. Patient voiced appreciation for call.

## 2021-09-07 NOTE — Telephone Encounter (Signed)
Fax records request to [facility] for pap smear requests

## 2021-09-08 ENCOUNTER — Telehealth: Payer: Self-pay

## 2021-09-08 ENCOUNTER — Other Ambulatory Visit: Payer: Self-pay | Admitting: Gynecologic Oncology

## 2021-09-08 DIAGNOSIS — Z1501 Genetic susceptibility to malignant neoplasm of breast: Secondary | ICD-10-CM

## 2021-09-08 LAB — CA 125: Cancer Antigen (CA) 125: 12.5 U/mL (ref 0.0–38.1)

## 2021-09-08 NOTE — Telephone Encounter (Signed)
I spoke to pt's husband, who is interpreter for pt.  He is aware of normal CA125 result. Medications were updated at this time.

## 2021-09-08 NOTE — Addendum Note (Signed)
Addended by: [place], Pernell Lenoir B on: 09/08/2021 12:00 PM   Modules accepted: Orders

## 2021-09-11 ENCOUNTER — Telehealth: Payer: Self-pay

## 2021-09-11 NOTE — Telephone Encounter (Signed)
Spoke with pts husband, Shanon Brow.  Informed him that Shyann's PAP was normal.  He verbalized understanding.

## 2021-09-12 ENCOUNTER — Ambulatory Visit (HOSPITAL_COMMUNITY): Admission: RE | Admit: 2021-09-12 | Payer: Medicaid Other | Source: Ambulatory Visit

## 2021-09-14 ENCOUNTER — Ambulatory Visit (HOSPITAL_COMMUNITY)
Admission: RE | Admit: 2021-09-14 | Discharge: 2021-09-14 | Disposition: A | Discharge status: Home / Self Care | Payer: Medicaid Other | Source: Ambulatory Visit | Attending: Gynecologic Oncology | Admitting: Gynecologic Oncology

## 2021-09-14 DIAGNOSIS — Z1501 Genetic susceptibility to malignant neoplasm of breast: Secondary | ICD-10-CM | POA: Insufficient documentation

## 2021-09-14 DIAGNOSIS — Z1509 Genetic susceptibility to other malignant neoplasm: Secondary | ICD-10-CM | POA: Diagnosis present

## 2021-09-15 ENCOUNTER — Encounter (HOSPITAL_BASED_OUTPATIENT_CLINIC_OR_DEPARTMENT_OTHER): Payer: Self-pay | Admitting: Plastic Surgery

## 2021-09-15 ENCOUNTER — Other Ambulatory Visit: Payer: Self-pay

## 2021-09-15 ENCOUNTER — Encounter (HOSPITAL_BASED_OUTPATIENT_CLINIC_OR_DEPARTMENT_OTHER)
Admission: RE | Disposition: A | Discharge status: Home / Self Care | Payer: Self-pay | Source: Ambulatory Visit | Attending: Plastic Surgery

## 2021-09-15 ENCOUNTER — Ambulatory Visit (HOSPITAL_BASED_OUTPATIENT_CLINIC_OR_DEPARTMENT_OTHER): Payer: Medicaid Other | Admitting: Anesthesiology

## 2021-09-15 ENCOUNTER — Ambulatory Visit (HOSPITAL_BASED_OUTPATIENT_CLINIC_OR_DEPARTMENT_OTHER)
Admission: RE | Admit: 2021-09-15 | Discharge: 2021-09-15 | Disposition: A | Discharge status: Home / Self Care | Payer: Medicaid Other | Source: Ambulatory Visit | Attending: Plastic Surgery | Admitting: Plastic Surgery

## 2021-09-15 DIAGNOSIS — Z1501 Genetic susceptibility to malignant neoplasm of breast: Secondary | ICD-10-CM | POA: Insufficient documentation

## 2021-09-15 DIAGNOSIS — Z9013 Acquired absence of bilateral breasts and nipples: Secondary | ICD-10-CM | POA: Insufficient documentation

## 2021-09-15 DIAGNOSIS — Z853 Personal history of malignant neoplasm of breast: Secondary | ICD-10-CM

## 2021-09-15 DIAGNOSIS — Z8041 Family history of malignant neoplasm of ovary: Secondary | ICD-10-CM | POA: Diagnosis not present

## 2021-09-15 DIAGNOSIS — Z01818 Encounter for other preprocedural examination: Secondary | ICD-10-CM

## 2021-09-15 DIAGNOSIS — Z421 Encounter for breast reconstruction following mastectomy: Secondary | ICD-10-CM | POA: Diagnosis present

## 2021-09-15 DIAGNOSIS — Z86000 Personal history of in-situ neoplasm of breast: Secondary | ICD-10-CM | POA: Insufficient documentation

## 2021-09-15 HISTORY — PX: REMOVAL OF BILATERAL TISSUE EXPANDERS WITH PLACEMENT OF BILATERAL BREAST IMPLANTS: SHX6431

## 2021-09-15 LAB — POCT PREGNANCY, URINE: Preg Test, Ur: NEGATIVE

## 2021-09-15 SURGERY — REMOVAL, TISSUE EXPANDER, BREAST, BILATERAL, WITH BILATERAL IMPLANT IMPLANT INSERTION
Anesthesia: General | Site: Chest | Laterality: Bilateral

## 2021-09-15 MED ORDER — GABAPENTIN 300 MG PO CAPS
ORAL_CAPSULE | ORAL | Status: AC
  Filled 2021-09-15: qty 1

## 2021-09-15 MED ORDER — CHLORHEXIDINE GLUCONATE CLOTH 2 % EX PADS: 6.0000 | MEDICATED_PAD | Freq: Once | CUTANEOUS | Status: DC

## 2021-09-15 MED ORDER — FENTANYL CITRATE (PF) 100 MCG/2ML IJ SOLN
INTRAMUSCULAR | Status: AC
  Filled 2021-09-15: qty 2

## 2021-09-15 MED ORDER — OXYCODONE HCL 5 MG/5ML PO SOLN: 5.0000 mg | Freq: Once | ORAL | Status: DC | PRN

## 2021-09-15 MED ORDER — HYDROMORPHONE HCL 1 MG/ML IJ SOLN
INTRAMUSCULAR | Status: AC
  Filled 2021-09-15: qty 0.5

## 2021-09-15 MED ORDER — ONDANSETRON HCL 4 MG/2ML IJ SOLN
INTRAMUSCULAR | Status: DC | PRN
  Administered 2021-09-15: 4 mg via INTRAVENOUS

## 2021-09-15 MED ORDER — 0.9 % SODIUM CHLORIDE (POUR BTL) OPTIME
TOPICAL | Status: DC | PRN
  Administered 2021-09-15: 100 mL

## 2021-09-15 MED ORDER — DEXAMETHASONE SODIUM PHOSPHATE 4 MG/ML IJ SOLN
INTRAMUSCULAR | Status: DC | PRN
  Administered 2021-09-15: 5 mg via INTRAVENOUS

## 2021-09-15 MED ORDER — SODIUM CHLORIDE 0.9 % IV SOLN
INTRAVENOUS | Status: DC | PRN
  Administered 2021-09-15: 500 mL

## 2021-09-15 MED ORDER — PROPOFOL 10 MG/ML IV BOLUS
INTRAVENOUS | Status: DC | PRN
  Administered 2021-09-15: 100 mg via INTRAVENOUS

## 2021-09-15 MED ORDER — GABAPENTIN 300 MG PO CAPS
300.0000 mg | ORAL_CAPSULE | ORAL | Status: AC
  Administered 2021-09-15: 300 mg via ORAL

## 2021-09-15 MED ORDER — SODIUM CHLORIDE 0.9 % IV SOLN
INTRAVENOUS | Status: AC
  Filled 2021-09-15: qty 10

## 2021-09-15 MED ORDER — MIDAZOLAM HCL 2 MG/2ML IJ SOLN
INTRAMUSCULAR | Status: AC
  Filled 2021-09-15: qty 2

## 2021-09-15 MED ORDER — FENTANYL CITRATE (PF) 100 MCG/2ML IJ SOLN
INTRAMUSCULAR | Status: DC | PRN
  Administered 2021-09-15 (×2): 25 ug via INTRAVENOUS
  Administered 2021-09-15: 50 ug via INTRAVENOUS

## 2021-09-15 MED ORDER — SCOPOLAMINE 1 MG/3DAYS TD PT72
1.0000 | MEDICATED_PATCH | TRANSDERMAL | Status: DC
  Administered 2021-09-15: 1.5 mg via TRANSDERMAL

## 2021-09-15 MED ORDER — CEFAZOLIN SODIUM-DEXTROSE 2-4 GM/100ML-% IV SOLN
2.0000 g | INTRAVENOUS | Status: AC
  Administered 2021-09-15: 2 g via INTRAVENOUS

## 2021-09-15 MED ORDER — ACETAMINOPHEN 500 MG PO TABS
1000.0000 mg | ORAL_TABLET | ORAL | Status: AC
  Administered 2021-09-15: 1000 mg via ORAL

## 2021-09-15 MED ORDER — OXYCODONE HCL 5 MG PO TABS: 5.0000 mg | ORAL_TABLET | Freq: Once | ORAL | Status: DC | PRN

## 2021-09-15 MED ORDER — SCOPOLAMINE 1 MG/3DAYS TD PT72
MEDICATED_PATCH | TRANSDERMAL | Status: AC
  Filled 2021-09-15: qty 1

## 2021-09-15 MED ORDER — LIDOCAINE HCL (CARDIAC) PF 100 MG/5ML IV SOSY
PREFILLED_SYRINGE | INTRAVENOUS | Status: DC | PRN
  Administered 2021-09-15: 40 mg via INTRAVENOUS

## 2021-09-15 MED ORDER — CELECOXIB 200 MG PO CAPS
ORAL_CAPSULE | ORAL | Status: AC
  Filled 2021-09-15: qty 1

## 2021-09-15 MED ORDER — BUPIVACAINE HCL (PF) 0.5 % IJ SOLN
INTRAMUSCULAR | Status: DC | PRN
  Administered 2021-09-15: 30 mL

## 2021-09-15 MED ORDER — LACTATED RINGERS IV SOLN: INTRAVENOUS | Status: DC

## 2021-09-15 MED ORDER — PROMETHAZINE HCL 25 MG/ML IJ SOLN: 6.2500 mg | INTRAMUSCULAR | Status: DC | PRN

## 2021-09-15 MED ORDER — MEPERIDINE HCL 25 MG/ML IJ SOLN: 6.2500 mg | INTRAMUSCULAR | Status: DC | PRN

## 2021-09-15 MED ORDER — ACETAMINOPHEN 500 MG PO TABS
ORAL_TABLET | ORAL | Status: AC
  Filled 2021-09-15: qty 2

## 2021-09-15 MED ORDER — MIDAZOLAM HCL 5 MG/5ML IJ SOLN
INTRAMUSCULAR | Status: DC | PRN
  Administered 2021-09-15: 2 mg via INTRAVENOUS

## 2021-09-15 MED ORDER — CEFAZOLIN SODIUM-DEXTROSE 2-4 GM/100ML-% IV SOLN
INTRAVENOUS | Status: AC
  Filled 2021-09-15: qty 100

## 2021-09-15 MED ORDER — CELECOXIB 200 MG PO CAPS
200.0000 mg | ORAL_CAPSULE | ORAL | Status: AC
  Administered 2021-09-15: 200 mg via ORAL

## 2021-09-15 MED ORDER — BUPIVACAINE HCL (PF) 0.5 % IJ SOLN
INTRAMUSCULAR | Status: AC
  Filled 2021-09-15: qty 30

## 2021-09-15 MED ORDER — MIDAZOLAM HCL 2 MG/2ML IJ SOLN: 0.5000 mg | Freq: Once | INTRAMUSCULAR | Status: DC | PRN

## 2021-09-15 MED ORDER — HYDROMORPHONE HCL 1 MG/ML IJ SOLN
0.2500 mg | INTRAMUSCULAR | Status: DC | PRN
  Administered 2021-09-15: 0.5 mg via INTRAVENOUS

## 2021-09-15 SURGICAL SUPPLY — 66 items
ADH SKN CLS APL DERMABOND .7 (GAUZE/BANDAGES/DRESSINGS) ×2
APL PRP STRL LF DISP 70% ISPRP (MISCELLANEOUS) ×2
BAG DECANTER FOR FLEXI CONT (MISCELLANEOUS) ×1 IMPLANT
BINDER BREAST 3XL (GAUZE/BANDAGES/DRESSINGS) IMPLANT
BINDER BREAST LRG (GAUZE/BANDAGES/DRESSINGS) IMPLANT
BINDER BREAST MEDIUM (GAUZE/BANDAGES/DRESSINGS) IMPLANT
BINDER BREAST XLRG (GAUZE/BANDAGES/DRESSINGS) IMPLANT
BINDER BREAST XXLRG (GAUZE/BANDAGES/DRESSINGS) IMPLANT
BLADE SURG 10 STRL SS (BLADE) ×2 IMPLANT
BNDG GAUZE DERMACEA FLUFF 4 (GAUZE/BANDAGES/DRESSINGS) ×2 IMPLANT
BNDG GZE DERMACEA 4 6PLY (GAUZE/BANDAGES/DRESSINGS) ×2
CANISTER SUCT 1200ML W/VALVE (MISCELLANEOUS) ×1 IMPLANT
CHLORAPREP W/TINT 26 (MISCELLANEOUS) ×2 IMPLANT
COVER BACK TABLE 60X90IN (DRAPES) ×1 IMPLANT
COVER MAYO STAND STRL (DRAPES) ×1 IMPLANT
DERMABOND ADVANCED .7 DNX12 (GAUZE/BANDAGES/DRESSINGS) ×2 IMPLANT
DRAIN CHANNEL 15F RND FF W/TCR (WOUND CARE) IMPLANT
DRAPE TOP ARMCOVERS (MISCELLANEOUS) ×1 IMPLANT
DRAPE U-SHAPE 76X120 STRL (DRAPES) ×1 IMPLANT
DRAPE UTILITY XL STRL (DRAPES) ×1 IMPLANT
DRSG TEGADERM 4X4.75 (GAUZE/BANDAGES/DRESSINGS) IMPLANT
ELECT BLADE 4.0 EZ CLEAN MEGAD (MISCELLANEOUS)
ELECT COATED BLADE 2.86 ST (ELECTRODE) ×1 IMPLANT
ELECT REM PT RETURN 9FT ADLT (ELECTROSURGICAL) ×1
ELECTRODE BLDE 4.0 EZ CLN MEGD (MISCELLANEOUS) IMPLANT
ELECTRODE REM PT RTRN 9FT ADLT (ELECTROSURGICAL) ×1 IMPLANT
EVACUATOR SILICONE 100CC (DRAIN) IMPLANT
GAUZE PAD ABD 8X10 STRL (GAUZE/BANDAGES/DRESSINGS) ×2 IMPLANT
GLOVE BIO SURGEON STRL SZ 6 (GLOVE) ×2 IMPLANT
GOWN STRL REUS W/ TWL LRG LVL3 (GOWN DISPOSABLE) ×2 IMPLANT
GOWN STRL REUS W/TWL LRG LVL3 (GOWN DISPOSABLE) ×2
IMPL BREAST P5.6XRND XFULL 375 (Breast) IMPLANT
IMPL BRST P5.6XRND XFULL 375CC (Breast) ×2 IMPLANT
IMPLANT BREAST GEL 375CC (Breast) ×2 IMPLANT
KIT FILL ASEPTIC TRANSFER (MISCELLANEOUS) IMPLANT
MARKER SKIN DUAL TIP RULER LAB (MISCELLANEOUS) IMPLANT
NDL FILTER BLUNT 18X1 1/2 (NEEDLE) IMPLANT
NDL HYPO 25X1 1.5 SAFETY (NEEDLE) IMPLANT
NEEDLE FILTER BLUNT 18X1 1/2 (NEEDLE) IMPLANT
NEEDLE HYPO 25X1 1.5 SAFETY (NEEDLE) IMPLANT
PACK BASIN DAY SURGERY FS (CUSTOM PROCEDURE TRAY) ×1 IMPLANT
PENCIL SMOKE EVACUATOR (MISCELLANEOUS) ×1 IMPLANT
PIN SAFETY STERILE (MISCELLANEOUS) IMPLANT
SHEET MEDIUM DRAPE 40X70 STRL (DRAPES) ×1 IMPLANT
SIZER BREAST 375CC (SIZER) ×1
SIZER BRSTX STRL LF SIL 375CC (SIZER) IMPLANT
SLEEVE SCD COMPRESS KNEE MED (STOCKING) ×1 IMPLANT
SLEEVE SURGEON STRL (DRAPES) IMPLANT
SPIKE FLUID TRANSFER (MISCELLANEOUS) IMPLANT
SPONGE T-LAP 18X18 ~~LOC~~+RFID (SPONGE) ×1 IMPLANT
STAPLER VISISTAT 35W (STAPLE) ×1 IMPLANT
SUT ETHILON 2 0 FS 18 (SUTURE) IMPLANT
SUT MNCRL AB 4-0 PS2 18 (SUTURE) IMPLANT
SUT PDS AB 2-0 CT2 27 (SUTURE) IMPLANT
SUT VIC AB 3-0 PS1 18 (SUTURE) ×1
SUT VIC AB 3-0 PS1 18XBRD (SUTURE) IMPLANT
SUT VIC AB 3-0 SH 27 (SUTURE) ×1
SUT VIC AB 3-0 SH 27X BRD (SUTURE) IMPLANT
SUT VICRYL 4-0 PS2 18IN ABS (SUTURE) IMPLANT
SYR 20ML LL LF (SYRINGE) IMPLANT
SYR BULB IRRIG 60ML STRL (SYRINGE) ×2 IMPLANT
SYR CONTROL 10ML LL (SYRINGE) IMPLANT
TOWEL GREEN STERILE FF (TOWEL DISPOSABLE) ×2 IMPLANT
TUBE CONNECTING 20X1/4 (TUBING) ×1 IMPLANT
UNDERPAD 30X36 HEAVY ABSORB (UNDERPADS AND DIAPERS) ×2 IMPLANT
YANKAUER SUCT BULB TIP NO VENT (SUCTIONS) ×1 IMPLANT

## 2021-09-15 NOTE — Discharge Instructions (Signed)
No Tylenol before 4:30pm if needed. No ibuprofen before 6:30pm if needed.  Post Anesthesia Home Care Instructions  Activity: Get plenty of rest for the remainder of the day. A responsible individual must stay with you for 24 hours following the procedure.  For the next 24 hours, DO NOT: -Drive a car -Paediatric nurse -Drink alcoholic beverages -Take any medication unless instructed by your physician -Make any legal decisions or sign important papers.  Meals: Start with liquid foods such as gelatin or soup. Progress to regular foods as tolerated. Avoid greasy, spicy, heavy foods. If nausea and/or vomiting occur, drink only clear liquids until the nausea and/or vomiting subsides. Call your physician if vomiting continues.  Special Instructions/Symptoms: Your throat may feel dry or sore from the anesthesia or the breathing tube placed in your throat during surgery. If this causes discomfort, gargle with warm salt water. The discomfort should disappear within 24 hours.  If you had a scopolamine patch placed behind your ear for the management of post- operative nausea and/or vomiting:  1. The medication in the patch is effective for 72 hours, after which it should be removed.  Wrap patch in a tissue and discard in the trash. Wash hands thoroughly with soap and water. 2. You may remove the patch earlier than 72 hours if you experience unpleasant side effects which may include dry mouth, dizziness or visual disturbances. 3. Avoid touching the patch. Wash your hands with soap and water after contact with the patch.

## 2021-09-15 NOTE — H&P (Signed)
Subjective:   Patient ID: Toni Crawford is a 46 y.o. female.  HPI  3 months post op bilateral nipple sparing mastectomies with immediate reconstruction tissue expander. Here for implant exchange.  Presented following screening MMG with right breast asymmetry. Diagnostic MMG/US showed a 2.7 x 2.3 x 1.1 cm mass right breast 3 o'clock, 1 cmfn. Axilla normal. Biopsy demonstrated low and intermediate grade DCIS with necrosis micro calcs involving an intraductal papilloma, ER/PR+.  Final pathology 1.3 cm DCIS right breast margins clear.  Genetics with BRCA1 mutation. Mother with ovarian ca. Sister with colon ca. Additional VUS in AXIN2, CDKN2A, MSH3, and NTHL1 genes.   Prior bra size not sure thinks B desires larger. 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  Review of Systems: Remainder 12 point review negative   Objective:  Physical Exam Cardiovascular:  Rate and Rhythm: Normal rate and regular rhythm.  Heart sounds: Normal heart sounds.  Pulmonary:  Effort: Pulmonary effort is normal.  Breath sounds: Normal breath sounds.    Chest:  Bilateral chest expanded SN to nipple R 21 L 21 cm Midline to nipple R 11 L 11 cm  Assessment:   DCIS right breast BRCA1 S/p bilateral NSM, prepectoral Towles/ADM (Alloderm) reconstruction  Plan:   Plan removal bilateral Leichter and placement implants.   Reviewed saline vs silicone, smooth v textured, shaped v round.  As in prepectoral position I do recommend HCG or capacity filled silicone implants to reduce risk visible rippling.  Reviewed MRI or [place] surveillance for rupture with silicone implants. Reviewed examples for 4th generation, capacity filled 4th generation, and HCG implants vs saline implants. Reviewed risks AP flipping that may be more noticeable with 5th generation implants, may require surgery to correct. Discussed risk ALCL with textured devices. Patient  has elected for silicone, plan smooth round.   Notes she desires larger than present with more cleavage. Notes asymmetry with left fuller superior/higher on chest. Counseled cannot assure complete symmetry. Counseled implants reconstruction are not natural breasts and may not be able to achieve the cleavage with breasts touching each other that she demonstrates.   Additional risks including but not limited to bleeding hematoma infection seroma need for additional procedures damage to adjacent structures blood clots in legs or lungs reviewed.   Reviewed OP surgery no drains anticipated. Previously provided [facility] implant document in [place]. We did not sign physician patient checklist as this document is not available in her primary language.  She asked if can be done at same time as oophorectomy. Counseled my preference is not to have additional procedures at time of implant placement to reduce infection risk.   Rx for tramadol bactrim and robaxin sent post visit.  Natrelle 133S FV-11-T 300 ml tissue expanders placed bilateral,  fill volume 270 ml saline bilateral.

## 2021-09-15 NOTE — Anesthesia Procedure Notes (Signed)
Procedure Name: LMA Insertion Date/Time: 09/15/2021 11:12 AM  Performed by: Willa Frater, CRNAPre-anesthesia Checklist: Patient identified, Emergency Drugs available, Suction available and Patient being monitored Patient Re-evaluated:Patient Re-evaluated prior to induction Oxygen Delivery Method: Circle system utilized Preoxygenation: Pre-oxygenation with 100% oxygen Induction Type: IV induction Ventilation: Mask ventilation without difficulty LMA: LMA inserted LMA Size: 3.0 Number of attempts: 1 Airway Equipment and Method: Bite block Placement Confirmation: positive ETCO2 Tube secured with: Tape Dental Injury: Teeth and Oropharynx as per pre-operative assessment

## 2021-09-15 NOTE — Anesthesia Preprocedure Evaluation (Addendum)
Anesthesia Evaluation  Patient identified by MRN, date of birth, ID band Patient awake    Reviewed: Allergy & Precautions, NPO status , Patient's Chart, lab work & pertinent test results  History of Anesthesia Complications Negative for: history of anesthetic complications  Airway Mallampati: II  TM Distance: >3 FB Neck ROM: Full    Dental  (+) Dental Advisory Given   Pulmonary neg pulmonary ROS,    breath sounds clear to auscultation       Cardiovascular (-) anginanegative cardio ROS   Rhythm:Regular Rate:Normal  '12 ECHO: Systolic function was normal. EF 55-60%. Wall motion was normal, normal RVF, no significant valvular abnormalities    Neuro/Psych negative neurological ROS     GI/Hepatic negative GI ROS, Neg liver ROS,   Endo/Other  diabetes, Gestational  Renal/GU negative Renal ROS     Musculoskeletal   Abdominal   Peds  Hematology   Anesthesia Other Findings Breast cancer  Reproductive/Obstetrics                            Anesthesia Physical Anesthesia Plan  ASA: 2  Anesthesia Plan: General   Post-op Pain Management: Tylenol PO (pre-op)*   Induction: Intravenous  PONV Risk Score and Plan: 3 and Ondansetron, Dexamethasone and Scopolamine patch - Pre-op  Airway Management Planned: LMA  Additional Equipment: None  Intra-op Plan:   Post-operative Plan:   Informed Consent: I have reviewed the patients History and Physical, chart, labs and discussed the procedure including the risks, benefits and alternatives for the proposed anesthesia with the patient or authorized representative who has indicated his/her understanding and acceptance.     Dental advisory given and Interpreter used for interveiw  Plan Discussed with: CRNA and Surgeon  Anesthesia Plan Comments:        Anesthesia Quick Evaluation

## 2021-09-15 NOTE — Transfer of Care (Signed)
Immediate Anesthesia Transfer of Care Note  Patient: Toni Crawford  Procedure(s) Performed: REMOVAL OF BILATERAL TISSUE EXPANDERS WITH PLACEMENT OF BILATERAL BREAST IMPLANTS (Bilateral: Chest)  Patient Location: PACU  Anesthesia Type:General  Level of Consciousness: sedated  Airway & Oxygen Therapy: Patient Spontanous Breathing and Patient connected to face mask oxygen  Post-op Assessment: Report given to RN and Post -op Vital signs reviewed and stable  Post vital signs: Reviewed and stable  Last Vitals:  Vitals Value Taken Time  BP    Temp    Pulse 75 09/15/21 1237  Resp    SpO2 100 % 09/15/21 1237  Vitals shown include unvalidated device data.  Last Pain:  Vitals:   09/15/21 1023  TempSrc: Oral  PainSc: 0-No pain      Patients Stated Pain Goal: 4 (58/83/25 4982)  Complications: No notable events documented.

## 2021-09-15 NOTE — Op Note (Signed)
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 BRCA1  POSTOPERATIVE DIAGNOSES:  same  PROCEDURE:  Removal bilateral chest tissue expanders and placement silicone implants  SURGEON: Irene Limbo MD MBA  ASSISTANT: none  ANESTHESIA:  General.   EBL: 20 ml  COMPLICATIONS: None immediate.   INDICATIONS FOR PROCEDURE:  The patient, Toni Crawford, is a 46 y.o. female born on [birth year], is here for staged breast reconstruction following nipple sparing mastectomies and prepectoral expander based reconstruction.   FINDINGS: Complete incorporation ADM noted bilateral. Natrelle Soft Touch Extra Projection smooth round 375 ml implants placed bilateral. REF SSX-375 RIGHT SN [id number] LEFT SN [id number]  DESCRIPTION OF PROCEDURE:  The patient's operative site was marked with the patient in the preoperative area. The patient was taken to the operating room. SCDs were placed and IV antibiotics were given. The patient's operative site was prepped and draped in a sterile fashion. A time out was performed and all information was confirmed to be correct.  Incision made in bilateral inframammary fold scar and carried through superficial fascia and ADM. Expanders removed. Over bilateral chest superior capsulotomy performed. Sizer placed in each cavity. Patient assessed for symmetry. Extra projection 375 ml implant selected for bilateral placement.    Each cavity irrigated with saline solution containing Ancef gentamicin and Betadine. Hemostasis ensured. Local anesthetic infiltrated in each chest. Implant placed in right chest cavity. Implant orientation ensured. Closure completed with 3-0 vicryl to approximated capsule and superficial fascia, 4-0 vicryl in dermis, and 4-0 monocryl subcuticular skin closure. Over left chest, implant placed. Implant  orientation ensured. Closure completed with 3-0 vicryl to approximated capsule and superficial fascia, 4-0 vicryl in dermis, and 4-0 monocryl subcuticular skin closure. Dermabond applied to incisions followed by dry dressing, breast binder.  The patient was allowed to wake from anesthesia, extubated and taken to the recovery room in satisfactory condition.   SPECIMENS: none  DRAINS: none  Irene Limbo, MD [facility] Plastic & Reconstructive Surgery  Office/ physician access line after hours [phone]

## 2021-09-15 NOTE — Anesthesia Postprocedure Evaluation (Signed)
Anesthesia Post Note  Patient: Caitlan Par Yutzy  Procedure(s) Performed: REMOVAL OF BILATERAL TISSUE EXPANDERS WITH PLACEMENT OF BILATERAL BREAST IMPLANTS (Bilateral: Chest)     Patient location during evaluation: PACU Anesthesia Type: General Level of consciousness: awake and alert, patient cooperative and oriented Pain management: pain level controlled Vital Signs Assessment: post-procedure vital signs reviewed and stable Respiratory status: spontaneous breathing, nonlabored ventilation and respiratory function stable Cardiovascular status: blood pressure returned to baseline and stable Postop Assessment: no apparent nausea or vomiting, adequate PO intake and able to ambulate Anesthetic complications: no   No notable events documented.  Last Vitals:  Vitals:   09/15/21 1345 09/15/21 1400  BP: 133/72 (!) 143/82  Pulse: 65 61  Resp: 12 16  Temp:  36.4 C  SpO2: 98% 96%    Last Pain:  Vitals:   09/15/21 1400  TempSrc:   PainSc: 0-No pain                 Flynn Gwyn,E. Jamekia [employer]ann[employer]

## 2021-09-18 ENCOUNTER — Encounter (HOSPITAL_BASED_OUTPATIENT_CLINIC_OR_DEPARTMENT_OTHER): Payer: Self-pay | Admitting: Plastic Surgery

## 2021-10-13 ENCOUNTER — Other Ambulatory Visit: Payer: Self-pay | Admitting: Hematology and Oncology

## 2021-10-13 ENCOUNTER — Telehealth: Payer: Self-pay

## 2021-10-13 MED ORDER — TAMOXIFEN CITRATE 20 MG PO TABS: 20.0000 mg | ORAL_TABLET | Freq: Every day | ORAL | 3 refills | Status: DC

## 2021-10-13 NOTE — Telephone Encounter (Signed)
Patient (with assistance of family member) called to cancel surgery scheduled for 11/16/21.  Patient shared that she has had two surgeries this year and "because of life issues" she would like to postpone surgery "until next summer."  Routed to providers.

## 2021-10-16 ENCOUNTER — Other Ambulatory Visit: Payer: Self-pay | Admitting: Gynecologic Oncology

## 2021-10-16 ENCOUNTER — Telehealth: Payer: Self-pay

## 2021-10-16 DIAGNOSIS — Z1501 Genetic susceptibility to malignant neoplasm of breast: Secondary | ICD-10-CM

## 2021-10-16 NOTE — Progress Notes (Signed)
Patient called the office and has decided to post-pone surgery. Phone note regarding this routed to Dr. Lindi Adie. [place] and CA 125 ordered for March 2024.

## 2021-10-16 NOTE — Telephone Encounter (Signed)
Via [employer] [phone], I LVM for patient to call office regarding future ultrasound and lab appointment.   Per Joylene John NP, since patient has decided to delay surgery, the recommendation would be for [place] and CA 125 check every six months. She just had this in Sept. I have placed orders for [place] and CA 125 if you can please set this up for 6 months and prob visit with Berline Lopes after.

## 2021-10-20 ENCOUNTER — Telehealth: Payer: Self-pay | Admitting: Gynecologic Oncology

## 2021-10-20 NOTE — Telephone Encounter (Signed)
I spoke with the patient and her husband after I noticed that she had decided to cancel surgery in November.  Given appearance of her endometrium, if we are delaying surgery, I recommend that we do an endometrial biopsy.  Patient amenable to come to clinic to have this done on 10/31.  Jeral Pinch MD Gynecologic Oncology

## 2021-10-31 ENCOUNTER — Inpatient Hospital Stay (HOSPITAL_BASED_OUTPATIENT_CLINIC_OR_DEPARTMENT_OTHER): Payer: Medicaid Other | Admitting: Gynecologic Oncology

## 2021-10-31 ENCOUNTER — Inpatient Hospital Stay: Payer: Medicaid Other | Attending: Hematology and Oncology

## 2021-10-31 ENCOUNTER — Other Ambulatory Visit: Payer: Self-pay

## 2021-10-31 ENCOUNTER — Encounter: Payer: Self-pay | Admitting: Gynecologic Oncology

## 2021-10-31 VITALS — BP 110/83 | HR 77 | Temp 98.3°F | Resp 16 | Ht 62.4 in | Wt 122.8 lb

## 2021-10-31 DIAGNOSIS — Z9013 Acquired absence of bilateral breasts and nipples: Secondary | ICD-10-CM | POA: Diagnosis not present

## 2021-10-31 DIAGNOSIS — Z1502 Genetic susceptibility to malignant neoplasm of ovary: Secondary | ICD-10-CM | POA: Diagnosis not present

## 2021-10-31 DIAGNOSIS — Z1501 Genetic susceptibility to malignant neoplasm of breast: Secondary | ICD-10-CM | POA: Insufficient documentation

## 2021-10-31 DIAGNOSIS — R9389 Abnormal findings on diagnostic imaging of other specified body structures: Secondary | ICD-10-CM

## 2021-10-31 DIAGNOSIS — N83201 Unspecified ovarian cyst, right side: Secondary | ICD-10-CM | POA: Insufficient documentation

## 2021-10-31 DIAGNOSIS — D0511 Intraductal carcinoma in situ of right breast: Secondary | ICD-10-CM | POA: Diagnosis present

## 2021-10-31 DIAGNOSIS — Z1509 Genetic susceptibility to other malignant neoplasm: Secondary | ICD-10-CM | POA: Insufficient documentation

## 2021-10-31 DIAGNOSIS — Z79810 Long term (current) use of selective estrogen receptor modulators (SERMs): Secondary | ICD-10-CM | POA: Diagnosis not present

## 2021-10-31 DIAGNOSIS — N83202 Unspecified ovarian cyst, left side: Secondary | ICD-10-CM | POA: Diagnosis not present

## 2021-10-31 DIAGNOSIS — Z800 Family history of malignant neoplasm of digestive organs: Secondary | ICD-10-CM | POA: Insufficient documentation

## 2021-10-31 DIAGNOSIS — Z8041 Family history of malignant neoplasm of ovary: Secondary | ICD-10-CM | POA: Diagnosis not present

## 2021-10-31 DIAGNOSIS — Z148 Genetic carrier of other disease: Secondary | ICD-10-CM

## 2021-10-31 DIAGNOSIS — Z170 Estrogen receptor positive status [ER+]: Secondary | ICD-10-CM | POA: Diagnosis not present

## 2021-10-31 LAB — PREGNANCY, URINE: Preg Test, Ur: NEGATIVE

## 2021-10-31 NOTE — Patient Instructions (Addendum)
It was good to see you today.  I will call you with biopsy results.  As long as your biopsy shows no cancer or precancer, we can continue with close monitoring until you are ready for surgery.  We will tentatively plan for an ultrasound, visit with me, and blood test in March.

## 2021-10-31 NOTE — Addendum Note (Signed)
Addended by: Lafonda Mosses on: 10/31/2021 03:41 PM   Modules accepted: Orders

## 2021-10-31 NOTE — Progress Notes (Signed)
Gynecologic Oncology Return Clinic Visit  10/31/21  Reason for Visit: endometrial biopsy  Treatment History: Oncology History  Ductal carcinoma in situ (DCIS) of right breast  04/11/2021 Initial Diagnosis   Screening mammogram detected right breast 2.7 cm indeterminate mass with calcifications, biopsy revealed low-grade DCIS with a focus of intermediate grade DCIS with necrosis involving intraductal papilloma, ER 100%, PR 15%   06/22/2021 Surgery   Bilateral mastectomies Left mastectomy: Fibrocystic changes Right mastectomy: DCIS with clear margins grade 2 ER 100% PR 15%    Patient was diagnosed earlier this year with low-grade DCIS of the right breast after a mass was found on screening mammogram.  She underwent bilateral mastectomies in June with the DCIS, grade 2, ER+.    Genetic testing performed in May of this year revealed a likely pathogenic variant in BRCA1, [id number]. Genetic testing also identified 4 variants of uncertain significance (VUS) - one in the AXIN2 gene called p.C241G, a second in the CDKN2A gene called p.V115L, a third in the [facility] gene called p.F198L and a fourth in the [place] gene called p.A264T   Patient reports that she has healed well from surgery.  She is scheduled for removal of her tissue expanders with placement of bilateral breast implants next week on 9/15.   She has been on tamoxifen since June.  She is tolerating this well without significant side effects.  Interval History: Doing well. Is recovering well from surgery. Denies any vaginal bleeding since her last visit with me. Denies pelvic pain.   Past Medical/Surgical History: Past Medical History:  Diagnosis Date   DCIS (ductal carcinoma in situ)    Family history of colon cancer    Family history of ovarian cancer    Gestational diabetes    glyburide   Heart murmur    told with pregnancy--no symptoms   Tuberculosis    "when I was young" states she received treatment    Past Surgical History:   Procedure Laterality Date   APPENDECTOMY     BREAST RECONSTRUCTION WITH PLACEMENT OF TISSUE EXPANDER AND ALLODERM Bilateral 06/22/2021   Procedure: BREAST RECONSTRUCTION WITH PLACEMENT OF TISSUE EXPANDER AND ALLODERM;  Surgeon: Irene Limbo, MD;  Location: [place];  Service: Plastics;  Laterality: Bilateral;   REMOVAL OF BILATERAL TISSUE EXPANDERS WITH PLACEMENT OF BILATERAL BREAST IMPLANTS Bilateral 09/15/2021   Procedure: REMOVAL OF BILATERAL TISSUE EXPANDERS WITH PLACEMENT OF BILATERAL BREAST IMPLANTS;  Surgeon: Irene Limbo, MD;  Location: [place];  Service: Plastics;  Laterality: Bilateral;   SIMPLE MASTECTOMY WITH AXILLARY SENTINEL NODE BIOPSY Bilateral 06/22/2021   Procedure: BILATERAL  MASTECTOMIES WITH IMMEDIATE RECONSTRUCTION, MAGTRACE INJECTION RIGHT BREAST;  Surgeon: Donnie Mesa, MD;  Location: [place];  Service: General;  Laterality: Bilateral;  LMA PEC BLOCK   UPPER GASTROINTESTINAL ENDOSCOPY      Family History  Problem Relation Age of Onset   Uterine cancer Mother    Diabetes Father    Kidney disease Father    Diabetes Sister    Uterine cancer Sister    Diabetes Sister    Colon cancer Maternal Uncle    Esophageal cancer Neg Hx    Rectal cancer Neg Hx    Stomach cancer Neg Hx     Social History   Socioeconomic History   Marital status: Married    Spouse name: Shanon Brow   Number of children: 4   Years of education: Not on file   Highest education level: 10th grade  Occupational History  Occupation: work as Architectural technologist  Tobacco Use   Smoking status: Never    Passive exposure: Past   Smokeless tobacco: Former    Types: Snuff, Database administrator Use   Vaping Use: Never used  Substance and Sexual Activity   Alcohol use: No   Drug use: No   Sexual activity: Yes    Birth control/protection: None  Other Topics Concern   Not on file  Social History Narrative   Not on file   Social Determinants of Health    Financial Resource Strain: Not on file  Food Insecurity: Not on file  Transportation Needs: No Transportation Needs (03/20/2018)   PRAPARE - Hydrologist (Medical): No    Lack of Transportation (Non-Medical): No  Physical Activity: Not on file  Stress: Not on file  Social Connections: Not on file    Current Medications:  Current Outpatient Medications:    cholecalciferol (VITAMIN D3) 25 MCG (1000 UT) tablet, Take 1,000 Units by mouth daily., Disp: , Rfl:    Ferrous Sulfate (IRON) 325 (65 Fe) MG TABS, Take 2 tablets by mouth daily in the afternoon., Disp: , Rfl:    tamoxifen (NOLVADEX) 20 MG tablet, Take 1 tablet (20 mg total) by mouth daily., Disp: 90 tablet, Rfl: 3  Review of Systems: Denies appetite changes, fevers, chills, fatigue, unexplained weight changes. Denies hearing loss, neck lumps or masses, mouth sores, ringing in ears or voice changes. Denies cough or wheezing.  Denies shortness of breath. Denies chest pain or palpitations. Denies leg swelling. Denies abdominal distention, pain, blood in stools, constipation, diarrhea, nausea, vomiting, or early satiety. Denies pain with intercourse, dysuria, frequency, hematuria or incontinence. Denies hot flashes, pelvic pain, vaginal bleeding or vaginal discharge.   Denies joint pain, back pain or muscle pain/cramps. Denies itching, rash, or wounds. Denies dizziness, headaches, numbness or seizures. Denies swollen lymph nodes or glands, denies easy bruising or bleeding. Denies anxiety, depression, confusion, or decreased concentration.  Physical Exam: BP 110/83 (BP Location: Right Arm, Patient Position: Sitting)   Pulse 77   Temp 98.3 F (36.8 C) (Oral)   Resp 16   Ht 5' 2.4" (1.585 m)   Wt 122 lb 12.8 oz (55.7 kg)   SpO2 98%   BMI 22.17 kg/m  General: Alert, oriented, no acute distress. HEENT: Normocephalic, atraumatic, sclera anicteric. Chest: Unlabored breathing on room air.  GU:  Normal appearing external genitalia without erythema, excoriation, or lesions.  Speculum exam reveals well rugated vaginal mucosa.  Cervix normal-appearing with an ectropion on the inferior lip of the cervix.  Bimanual exam performed last visit, not repeated today.  Endometrial biopsy procedure Preoperative diagnosis: Thickened endometrium, BRCA1 mutation Postoperative diagnosis: Same as above Physician: Berline Lopes MD Estimated blood loss: Minimal Specimens: Endometrial biopsy Procedure: After the procedure was discussed with the patient including risks and benefits, she gave verbal consent.  She was then placed in dorsolithotomy position and a speculum was placed in the vagina.  Once the cervix was well visualized it was cleansed with Betadine x3.  An endometrial Pipelle was then passed to a depth of just over 7 cm.  1 pass was performed with adequate tissue obtained.  This was placed in formalin.  Overall the patient tolerated the procedure well.  All instruments were removed from the vagina.  Laboratory & Radiologic Studies: Pelvic ultrasound 09/14/21: 1. Slightly enlarged appearing uterus without discrete mass. Endometrial thickness of 22 mm. Endometrial thickness is considered abnormal for an asymptomatic post-menopausal  female. Endometrial sampling should be considered to exclude carcinoma. 2. Bilateral ovarian cysts with thin septation, indeterminate but probably benign. Surgical consultation suggested, short interval 6-12 week sonographic follow-up also suggested.  Assessment & Plan: Toni Crawford is a 46 y.o. woman with BRCA1 mutation with thickened endometrium, bilateral ovarian cysts (minimally complex).  The patient is wanting to defer definitive surgery until next summer.  Given no menses for multiple months now with thickened endometrium in the setting of her BRCA1 mutation, which increases her baseline risk of uterine serous carcinoma, I recommended endometrial biopsy.  This was  performed without difficulty today.  I will call her with the results.  If benign biopsy, discussed plan for follow-up in March with repeat exam, ultrasound, and Ca1 25.  20 minutes of total time was spent for this patient encounter, including preparation, face-to-face counseling with the patient and coordination of care, and documentation of the encounter.  Jeral Pinch, MD  Division of Gynecologic Oncology  Department of Obstetrics and Gynecology  [facility]

## 2021-11-02 LAB — SURGICAL PATHOLOGY

## 2021-11-03 ENCOUNTER — Telehealth: Payer: Self-pay

## 2021-11-03 NOTE — Telephone Encounter (Signed)
Using [facility] [id number], LVM for patient to call back regarding biopsy results.   Per Dr. Berline Lopes, biopsy does not show precancer or cancer

## 2021-11-06 NOTE — Telephone Encounter (Signed)
I contacted Toni Crawford via her husband. Notified David of pt's negative biopsy result. He states he will pass on the information to his wife.

## 2021-11-10 ENCOUNTER — Encounter: Payer: Medicaid Other | Admitting: Gynecologic Oncology

## 2021-11-13 ENCOUNTER — Telehealth: Payer: Self-pay

## 2021-11-13 NOTE — Telephone Encounter (Signed)
Per Dr. Berline Lopes  move up this patient's ultrasound to late December or early January (currently scheduled in March)  Ultrasound has been rescheduled to 12/27/21 @ 11:00 with a follow up phone visit to discuss results with Berline Lopes on 12/29.   Unable to reach out to pt via voicemail d/t voicemail is full. No Mychart available.

## 2021-11-16 ENCOUNTER — Ambulatory Visit: Admit: 2021-11-16 | Payer: Medicaid Other | Admitting: Gynecologic Oncology

## 2021-11-16 DIAGNOSIS — Z1501 Genetic susceptibility to malignant neoplasm of breast: Secondary | ICD-10-CM

## 2021-11-16 SURGERY — HYSTERECTOMY, TOTAL, ROBOT-ASSISTED, LAPAROSCOPIC, WITH BILATERAL SALPINGO-OOPHORECTOMY
Anesthesia: General

## 2021-12-08 ENCOUNTER — Encounter: Payer: Medicaid Other | Admitting: Gynecologic Oncology

## 2021-12-27 ENCOUNTER — Telehealth: Payer: Self-pay | Admitting: *Deleted

## 2021-12-27 ENCOUNTER — Ambulatory Visit (HOSPITAL_COMMUNITY): Admission: RE | Admit: 2021-12-27 | Payer: Medicaid Other | Source: Ambulatory Visit

## 2021-12-27 NOTE — Telephone Encounter (Signed)
LMOM for the patient's husband to call the office back. Patient needs to reschedule her [place] scan and her follow up appt

## 2021-12-28 ENCOUNTER — Other Ambulatory Visit: Payer: Self-pay

## 2021-12-28 ENCOUNTER — Inpatient Hospital Stay: Payer: Medicaid Other | Attending: Hematology and Oncology | Admitting: Adult Health

## 2021-12-28 ENCOUNTER — Encounter: Payer: Self-pay | Admitting: Adult Health

## 2021-12-28 VITALS — BP 116/82 | HR 83 | Temp 98.1°F | Resp 18 | Ht 62.4 in | Wt 124.6 lb

## 2021-12-28 DIAGNOSIS — Z79810 Long term (current) use of selective estrogen receptor modulators (SERMs): Secondary | ICD-10-CM | POA: Insufficient documentation

## 2021-12-28 DIAGNOSIS — D0511 Intraductal carcinoma in situ of right breast: Secondary | ICD-10-CM | POA: Diagnosis present

## 2021-12-28 DIAGNOSIS — Z9013 Acquired absence of bilateral breasts and nipples: Secondary | ICD-10-CM | POA: Insufficient documentation

## 2021-12-28 DIAGNOSIS — Z170 Estrogen receptor positive status [ER+]: Secondary | ICD-10-CM | POA: Insufficient documentation

## 2021-12-28 NOTE — Progress Notes (Signed)
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 The CancerNext-Expanded gene panel offered by [facility] and includes sequencing and rearrangement analysis for the following 77 genes: AIP, ALK, APC*, ATM*, AXIN2, BAP1, BARD1, BLM, BMPR1A, BRCA1*, BRCA2*, BRIP1*, CDC73, CDH1*, CDK4, CDKN1B, CDKN2A, CHEK2*, CTNNA1, DICER1, FANCC, FH, FLCN, GALNT12, KIF1B, LZTR1, MAX, MEN1, MET, MLH1*, MSH2*, MSH3, MSH6*, MUTYH*, NBN, NF1*, NF2, NTHL1, PALB2*, PHOX2B, PMS2*, POT1, PRKAR1A, PTCH1, PTEN*, RAD51C*, RAD51D*, RB1, RECQL, RET, SDHA, SDHAF2, SDHB, SDHC, SDHD, SMAD4, SMARCA4, SMARCB1, SMARCE1, STK11, SUFU, TMEM127, TP53*, TSC1, TSC2, VHL and XRCC2 (sequencing and deletion/duplication); EGFR, EGLN1, HOXB13, KIT, MITF, PDGFRA, POLD1, and POLE (sequencing only); EPCAM and GREM1    06/22/2021 Surgery   Bilateral mastectomies Left mastectomy: Fibrocystic changes Right mastectomy: DCIS with clear margins grade 2 ER 100% PR 15%   06/2021 -  Anti-estrogen oral therapy   Tamoxifen   12/28/2021 Cancer Staging   Staging form: Breast, AJCC 8th Edition - Clinical: Stage 0 (cTis (DCIS), cN0, cM0) - Signed by Gardenia Phlegm, NP on 12/28/2021     INTERVAL HISTORY:  Toni Crawford to review her survivorship care plan detailing her treatment course for breast cancer, as well as monitoring long-term side effects of that treatment, education regarding  health maintenance, screening, and overall wellness and health promotion.   She is accompanied by her virtual interpreter through the [employer] interpreter line.  Overall, Toni Crawford reports feeling quite well.  She is taking tamoxifen daily with good tolerance and denies any side effects.  REVIEW OF SYSTEMS:  Review of Systems  Constitutional:  Negative for appetite change, chills, fatigue, fever and unexpected weight change.  HENT:   Negative for hearing loss, lump/mass and trouble swallowing.   Eyes:  Negative for eye problems and icterus.  Respiratory:  Negative for chest tightness, cough and shortness of breath.   Cardiovascular:  Negative for chest pain, leg swelling and palpitations.  Gastrointestinal:  Negative for abdominal distention, abdominal pain, constipation, diarrhea, nausea and vomiting.  Endocrine: Negative for hot flashes.  Genitourinary:  Negative for difficulty urinating.   Musculoskeletal:  Negative for arthralgias.  Skin:  Negative for itching and rash.  Neurological:  Negative for dizziness, extremity weakness, headaches and numbness.  Hematological:  Negative for adenopathy. Does not bruise/bleed easily.  Psychiatric/Behavioral:  Negative for depression. The patient is not nervous/anxious.    Breast: Denies any new nodularity, masses, tenderness, nipple changes, or nipple discharge.    PAST MEDICAL/SURGICAL HISTORY:  Past Medical History:  Diagnosis Date   DCIS (ductal carcinoma in situ)    Family history of colon cancer    Family history of ovarian cancer    Gestational diabetes    glyburide   Heart murmur    told with pregnancy--no symptoms   Tuberculosis    "when I was young" states she received treatment   Past Surgical History:  Procedure Laterality Date   APPENDECTOMY     BREAST RECONSTRUCTION WITH PLACEMENT OF TISSUE EXPANDER AND ALLODERM Bilateral 06/22/2021   Procedure: BREAST RECONSTRUCTION WITH PLACEMENT OF  TISSUE EXPANDER AND ALLODERM;  Surgeon:  Irene Limbo, MD;  Location: [place];  Service: Plastics;  Laterality: Bilateral;   REMOVAL OF BILATERAL TISSUE EXPANDERS WITH PLACEMENT OF BILATERAL BREAST IMPLANTS Bilateral 09/15/2021   Procedure: REMOVAL OF BILATERAL TISSUE EXPANDERS WITH PLACEMENT OF BILATERAL BREAST IMPLANTS;  Surgeon: Irene Limbo, MD;  Location: [place];  Service: Plastics;  Laterality: Bilateral;   SIMPLE MASTECTOMY WITH AXILLARY SENTINEL NODE BIOPSY Bilateral 06/22/2021   Procedure: BILATERAL  MASTECTOMIES WITH IMMEDIATE RECONSTRUCTION, MAGTRACE INJECTION RIGHT BREAST;  Surgeon: Donnie Mesa, MD;  Location: [place];  Service: General;  Laterality: Bilateral;  LMA PEC BLOCK   UPPER GASTROINTESTINAL ENDOSCOPY       ALLERGIES:  No Known Allergies   CURRENT MEDICATIONS:  Outpatient Encounter Medications as of 12/28/2021  Medication Sig   cholecalciferol (VITAMIN D3) 25 MCG (1000 UT) tablet Take 1,000 Units by mouth daily.   Ferrous Sulfate (IRON) 325 (65 Fe) MG TABS Take 2 tablets by mouth daily in the afternoon.   tamoxifen (NOLVADEX) 20 MG tablet Take 1 tablet (20 mg total) by mouth daily.   No facility-administered encounter medications on file as of 12/28/2021.     ONCOLOGIC FAMILY HISTORY:  Family History  Problem Relation Age of Onset   Uterine cancer Mother    Diabetes Father    Kidney disease Father    Diabetes Sister    Uterine cancer Sister    Diabetes Sister    Colon cancer Maternal Uncle    Esophageal cancer Neg Hx    Rectal cancer Neg Hx    Stomach cancer Neg Hx      SOCIAL HISTORY:  Social History   Socioeconomic History   Marital status: Married    Spouse name: Shanon Brow   Number of children: 4   Years of education: Not on file   Highest education level: 10th grade  Occupational History   Occupation: work as Architectural technologist  Tobacco Use   Smoking status: Never    Passive exposure: Past   Smokeless tobacco: Former     Types: Snuff, Database administrator Use   Vaping Use: Never used  Substance and Sexual Activity   Alcohol use: No   Drug use: No   Sexual activity: Yes    Birth control/protection: None  Other Topics Concern   Not on file  Social History Narrative   Not on file   Social Determinants of Health   Financial Resource Strain: Not on file  Food Insecurity: Not on file  Transportation Needs: No Transportation Needs (03/20/2018)   PRAPARE - Hydrologist (Medical): No    Lack of Transportation (Non-Medical): No  Physical Activity: Not on file  Stress: Not on file  Social Connections: Not on file  Intimate Partner Violence: Not on file     OBSERVATIONS/OBJECTIVE:  BP 116/82 (BP Location: Left Arm, Patient Position: Sitting)   Pulse 83   Temp 98.1 F (36.7 C) (Tympanic)   Resp 18   Ht 5' 2.4" (1.585 m)   Wt 124 lb 9.6 oz (56.5 kg)   SpO2 97%   BMI 22.50 kg/m  GENERAL: Patient is a well appearing female in no acute distress HEENT:  Sclerae anicteric.  Oropharynx clear and moist. No ulcerations or evidence of oropharyngeal candidiasis. Neck is supple.  NODES:  No cervical, supraclavicular, or axillary lymphadenopathy palpated.  BREAST EXAM: Status post bilateral mastectomies no sign of local recurrence. LUNGS:  Clear  to auscultation bilaterally.  No wheezes or rhonchi. HEART:  Regular rate and rhythm. No murmur appreciated. ABDOMEN:  Soft, nontender.  Positive, normoactive bowel sounds. No organomegaly palpated. MSK:  No focal spinal tenderness to palpation. Full range of motion bilaterally in the upper extremities. EXTREMITIES:  No peripheral edema.   SKIN:  Clear with no obvious rashes or skin changes. No nail dyscrasia. NEURO:  Nonfocal. Well oriented.  Appropriate affect.   LABORATORY DATA:  None for this visit.  DIAGNOSTIC IMAGING:  None for this visit.      ASSESSMENT AND PLAN:  Ms.. Toni Crawford is a pleasant 46 y.o. 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 She presents to the Survivorship Clinic for our initial meeting and routine follow-up post-completion of treatment for breast cancer.    1. Stage 0 right breast cancer:  Ms. Stanke is continuing to recover from definitive treatment for breast cancer. She will follow-up with her medical oncologist, Dr. Lindi Adie in 6 months with history and physical exam per surveillance protocol.  She will continue her antiestrogen therapy with tamoxifen as she is tolerating it well.  As she has had bilateral mastectomies I am checking with Dr. Lindi Adie about this.  Today, a comprehensive survivorship care plan and treatment summary was reviewed with the patient today detailing her breast cancer diagnosis, treatment course, potential late/long-term effects of treatment, appropriate follow-up care with recommendations for the future, and patient education resources.  A copy of this summary, along with a letter will be sent to the patient's primary care provider via mail/fax/In Basket message after today's visit.    2. Bone health:  She was given education on specific activities to promote bone health.  3. Cancer screening:  Due to Ms. Colcord's history and her age, she should receive screening for skin cancers, colon cancer, and gynecologic cancers.  The information and recommendations are listed on the patient's comprehensive care plan/treatment summary and were reviewed in detail with the patient.    4. Health maintenance and wellness promotion: Ms. Raether was encouraged to consume 5-7 servings of fruits and vegetables per day. We reviewed the "Nutrition Rainbow" handout.  She was also encouraged to engage in moderate to vigorous exercise for 30 minutes per day most days of the week. We discussed the [employer] fitness program, which is designed for cancer survivors to  help them become more physically fit after cancer treatments.  She was instructed to limit her alcohol consumption and continue to abstain from tobacco use.     5. Support services/counseling: It is not uncommon for this period of the patient's cancer care trajectory to be one of many emotions and stressors.  She was given information regarding our available services and encouraged to contact me with any questions or for help enrolling in any of our support group/programs.    Follow up instructions:    -Return to cancer center 6 months for follow-up with Dr. Lindi Adie -She is welcome to return back to the Survivorship Clinic at any time; no additional follow-up needed at this time.  -Consider referral back to survivorship as a long-term survivor for continued surveillance  The patient was provided an opportunity to ask questions and all were answered. The patient agreed with the plan and demonstrated an understanding of the instructions.   Total encounter time:45 minutes*in face-to-face visit time, chart review, lab review, care coordination,  order entry, and documentation of the encounter time.    Wilber Bihari, NP 12/28/21 2:09 PM Medical Oncology and Hematology [facility] [place], [place] [address] Tel. [phone]    Fax. [phone]  *Total Encounter Time as defined by the Centers for Medicare and Medicaid Services includes, in addition to the face-to-face time of a patient visit (documented in the note above) non-face-to-face time: obtaining and reviewing outside history, ordering and reviewing medications, tests or procedures, care coordination (communications with other health care professionals or caregivers) and documentation in the medical record.

## 2021-12-28 NOTE — Progress Notes (Signed)
[facility] for Pts records. Fax number given.

## 2021-12-29 ENCOUNTER — Inpatient Hospital Stay: Payer: Medicaid Other | Admitting: Gynecologic Oncology

## 2022-01-04 ENCOUNTER — Ambulatory Visit (HOSPITAL_COMMUNITY): Admission: RE | Admit: 2022-01-04 | Payer: Medicaid Other | Source: Ambulatory Visit

## 2022-01-09 ENCOUNTER — Telehealth: Payer: Self-pay | Admitting: *Deleted

## 2022-01-09 NOTE — Telephone Encounter (Signed)
Rescheduled the patient's [place] appt for 1/12 at 2:30 pm. Patient's husband given the appt date and time

## 2022-01-12 ENCOUNTER — Ambulatory Visit (HOSPITAL_COMMUNITY)
Admission: RE | Admit: 2022-01-12 | Discharge: 2022-01-12 | Disposition: A | Discharge status: Home / Self Care | Payer: Medicaid Other | Source: Ambulatory Visit | Attending: Gynecologic Oncology | Admitting: Gynecologic Oncology

## 2022-01-12 DIAGNOSIS — Z1509 Genetic susceptibility to other malignant neoplasm: Secondary | ICD-10-CM | POA: Diagnosis present

## 2022-01-12 DIAGNOSIS — Z1501 Genetic susceptibility to malignant neoplasm of breast: Secondary | ICD-10-CM | POA: Insufficient documentation

## 2022-01-15 ENCOUNTER — Telehealth: Payer: Self-pay

## 2022-01-15 NOTE — Telephone Encounter (Signed)
-----  Message from Gardenia Phlegm, NP sent at 01/14/2022  8:51 PM EST ----- Please call patient and let her know that I reviewed her health maintenance. I recommend that she go for colon cancer screening. I also recommend that she stop taking tamoxifen if she hasn't already.  Thanks, [place]

## 2022-01-15 NOTE — Telephone Encounter (Signed)
Called husband Shanon Brow and given below message message. He verbalized understanding and will give his wife the message. She will stop the Tamoxifen.  They will call the office back for questions.

## 2022-01-16 ENCOUNTER — Telehealth: Payer: Self-pay

## 2022-01-16 NOTE — Telephone Encounter (Signed)
[facility]ia [facility] BB#[id number], voicemail left for patient to call office regarding a message from Dr.Tucker on ultrasound results.   Dr.Tucker's message:could you please call and let her know that ultrasound showed complex cyst on left ovary has resolved. There is now a simple (or benign appearing) cyst in the ovary. Lining of uterus is still thickened - likely from Tamoxifen. Prior biopsy was negative.   Per Joylene John NP, potential dates for risk reducing surgery for the spring are as follows.  April 4th April 18 May 2 May 16 May 30 June 6. Pt can choose which she prefers when she calls back.

## 2022-01-18 NOTE — Telephone Encounter (Signed)
[facility]ia [employer] [phone], I left another voicemail for patient to call office.

## 2022-01-18 NOTE — Telephone Encounter (Signed)
Shanon Brow, pt's husband, returned call. He is aware of Dr.Tuckers message regarding the results of the ultrasound. Potential dates for surgery given. He will call back once he and Jazmeen decide on a date.

## 2022-01-22 NOTE — Telephone Encounter (Signed)
I spoke to Shanon Brow, Ms.Virella's husband, he stated they have chosen the surgery date of June 6th. He is aware I will notify Joylene John NP, and closer to surgery date they will get a call from [facility] for a pre-admit date/time and may also need to come in to see Berline Lopes and a pre-op with [facility].  He voiced an understanding.

## 2022-01-23 ENCOUNTER — Telehealth: Payer: Self-pay

## 2022-01-23 NOTE — Telephone Encounter (Signed)
Per Dr. Berline Lopes, Pt is scheduled for a follow up on 04/06/22 with [facility] (no openings in March).  Also, scheduled for a pre-op with Joylene John  On 5/23 @ 2:00. Pt agreed to dates/times.   [place] [phone] used to schedule appointments

## 2022-01-23 NOTE — Telephone Encounter (Signed)
Please let her scheduled to see me in March for exam and CA-125. She can be scheduled for late May too for pre-op - this can be with Melissa only on a day I'm around.

## 2022-01-23 NOTE — Telephone Encounter (Signed)
error 

## 2022-01-26 ENCOUNTER — Other Ambulatory Visit: Payer: Self-pay | Admitting: Gynecologic Oncology

## 2022-01-26 DIAGNOSIS — R9389 Abnormal findings on diagnostic imaging of other specified body structures: Secondary | ICD-10-CM

## 2022-01-26 DIAGNOSIS — Z1501 Genetic susceptibility to malignant neoplasm of breast: Secondary | ICD-10-CM

## 2022-02-16 ENCOUNTER — Ambulatory Visit: Payer: Medicaid Other | Admitting: Gynecologic Oncology

## 2022-03-16 ENCOUNTER — Ambulatory Visit (HOSPITAL_COMMUNITY): Payer: Medicaid Other

## 2022-03-16 ENCOUNTER — Other Ambulatory Visit: Payer: Medicaid Other

## 2022-03-20 ENCOUNTER — Ambulatory Visit: Payer: Medicaid Other | Admitting: Gynecologic Oncology

## 2022-04-06 ENCOUNTER — Inpatient Hospital Stay (HOSPITAL_BASED_OUTPATIENT_CLINIC_OR_DEPARTMENT_OTHER): Payer: Medicaid Other

## 2022-04-06 ENCOUNTER — Other Ambulatory Visit: Payer: Self-pay

## 2022-04-06 ENCOUNTER — Inpatient Hospital Stay: Payer: Medicaid Other | Attending: Gynecologic Oncology | Admitting: Gynecologic Oncology

## 2022-04-06 ENCOUNTER — Encounter: Payer: Self-pay | Admitting: Gynecologic Oncology

## 2022-04-06 VITALS — BP 108/76 | HR 84 | Temp 98.8°F | Wt 124.6 lb

## 2022-04-06 DIAGNOSIS — Z800 Family history of malignant neoplasm of digestive organs: Secondary | ICD-10-CM | POA: Diagnosis not present

## 2022-04-06 DIAGNOSIS — Z1502 Genetic susceptibility to malignant neoplasm of ovary: Secondary | ICD-10-CM | POA: Diagnosis not present

## 2022-04-06 DIAGNOSIS — Z9013 Acquired absence of bilateral breasts and nipples: Secondary | ICD-10-CM | POA: Diagnosis not present

## 2022-04-06 DIAGNOSIS — Z7189 Other specified counseling: Secondary | ICD-10-CM

## 2022-04-06 DIAGNOSIS — Z148 Genetic carrier of other disease: Secondary | ICD-10-CM | POA: Insufficient documentation

## 2022-04-06 DIAGNOSIS — Z1501 Genetic susceptibility to malignant neoplasm of breast: Secondary | ICD-10-CM

## 2022-04-06 DIAGNOSIS — Z1509 Genetic susceptibility to other malignant neoplasm: Secondary | ICD-10-CM | POA: Insufficient documentation

## 2022-04-06 DIAGNOSIS — D0511 Intraductal carcinoma in situ of right breast: Secondary | ICD-10-CM | POA: Diagnosis not present

## 2022-04-06 DIAGNOSIS — Z79810 Long term (current) use of selective estrogen receptor modulators (SERMs): Secondary | ICD-10-CM | POA: Diagnosis not present

## 2022-04-06 DIAGNOSIS — Z8041 Family history of malignant neoplasm of ovary: Secondary | ICD-10-CM | POA: Diagnosis not present

## 2022-04-06 DIAGNOSIS — Z170 Estrogen receptor positive status [ER+]: Secondary | ICD-10-CM | POA: Diagnosis not present

## 2022-04-06 NOTE — Patient Instructions (Addendum)
We will tentatively plan for surgery at [facility] with Dr. Eugene Garnet on June 07, 2022. We will see you back in the office closer to the date for a preop appointment with Warner Mccreedy NP to discuss the instructions for before and after surgery. At this visit, the instructions below will be re-discussed.  You may also receive a phone call from the hospital to arrange for a pre-op appointment there as well. Usually both appointments can be combined on the same day.   Preparing for your Surgery  Plan for surgery on June 07, 2022 with Dr. Eugene Garnet at [facility]. You will be scheduled for robotic assisted total laparoscopic hysterectomy (removal of the uterus and cervix), bilateral salpingo-oophorectomy (removal of both ovaries and fallopian tubes), possible staging.  Pre-operative Testing -You will receive a phone call from presurgical testing at [facility] to arrange for a pre-operative appointment and lab work.  -Bring your insurance card, copy of an advanced directive if applicable, medication list  -At that visit, you will be asked to sign a consent for a possible blood transfusion in case a transfusion becomes necessary during surgery.  The need for a blood transfusion is rare but having consent is a necessary part of your care.     -You should not be taking blood thinners or aspirin at least ten days prior to surgery unless instructed by your surgeon.  -Do not take supplements such as fish oil (omega 3), red yeast rice, turmeric before your surgery. You want to avoid medications with aspirin in them including headache powders such as BC or Goody's), Excedrin migraine.  Day Before Surgery at Home -You will be asked to take in a light diet the day before surgery. You will be advised you can have clear liquids up until 3 hours before your surgery.    Eat a light diet the day before surgery.  Examples including soups, broths, toast, yogurt, mashed  potatoes.  AVOID GAS PRODUCING FOODS AND BEVERAGES. Things to avoid include carbonated beverages (fizzy beverages, sodas), raw fruits and raw vegetables (uncooked), or beans.   If your bowels are filled with gas, your surgeon will have difficulty visualizing your pelvic organs which increases your surgical risks.  Your role in recovery Your role is to become active as soon as directed by your doctor, while still giving yourself time to heal.  Rest when you feel tired. You will be asked to do the following in order to speed your recovery:  - Cough and breathe deeply. This helps to clear and expand your lungs and can prevent pneumonia after surgery.  - STAY ACTIVE WHEN YOU GET HOME. Do mild physical activity. Walking or moving your legs help your circulation and body functions return to normal. Do not try to get up or walk alone the first time after surgery.   -If you develop swelling on one leg or the other, pain in the back of your leg, redness/warmth in one of your legs, please call the office or go to the Emergency Room to have a doppler to rule out a blood clot. For shortness of breath, chest pain-seek care in the Emergency Room as soon as possible. - Actively manage your pain. Managing your pain lets you move in comfort. We will ask you to rate your pain on a scale of zero to 10. It is your responsibility to tell your doctor or nurse where and how much you hurt so your pain can be treated.  Special Considerations -If you are diabetic, you may be placed on insulin after surgery to have closer control over your blood sugars to promote healing and recovery.  This does not mean that you will be discharged on insulin.  If applicable, your oral antidiabetics will be resumed when you are tolerating a solid diet.  -Your final pathology results from surgery should be available around one week after surgery and the results will be relayed to you when available.  -FMLA forms can be faxed to [phone]  and please allow 5-7 business days for completion.  Pain Management After Surgery -You Will be prescribed your pain medication and bowel regimen medications before surgery so that you can have these available when you are discharged from the hospital. The pain medication is for use ONLY AFTER surgery and a new prescription will not be given.   -Make sure that you have Tylenol and Ibuprofen IF YOU ARE ABLE TO TAKE THESE MEDICATIONS at home to use on a regular basis after surgery for pain control. We recommend alternating the medications every hour to six hours since they work differently and are processed in the body differently for pain relief.  -Review the attached handout on narcotic use and their risks and side effects.   Bowel Regimen -You will be prescribed Sennakot-S to take nightly to prevent constipation especially if you are taking the narcotic pain medication intermittently.  It is important to prevent constipation and drink adequate amounts of liquids. You can stop taking this medication when you are not taking pain medication and you are back on your normal bowel routine.  Risks of Surgery Risks of surgery are low but include bleeding, infection, damage to surrounding structures, re-operation, blood clots, and very rarely death.   Blood Transfusion Information (For the consent to be signed before surgery)  We will be checking your blood type before surgery so in case of emergencies, we will know what type of blood you would need.                                            WHAT IS A BLOOD TRANSFUSION?  A transfusion is the replacement of blood or some of its parts. Blood is made up of multiple cells which provide different functions. Red blood cells carry oxygen and are used for blood loss replacement. White blood cells fight against infection. Platelets control bleeding. Plasma helps clot blood. Other blood products are available for specialized needs, such as hemophilia or  other clotting disorders. BEFORE THE TRANSFUSION  Who gives blood for transfusions?  You may be able to donate blood to be used at a later date on yourself (autologous donation). Relatives can be asked to donate blood. This is generally not any safer than if you have received blood from a stranger. The same precautions are taken to ensure safety when a relative's blood is donated. Healthy volunteers who are fully evaluated to make sure their blood is safe. This is blood bank blood. Transfusion therapy is the safest it has ever been in the practice of medicine. Before blood is taken from a donor, a complete history is taken to make sure that person has no history of diseases nor engages in risky social behavior (examples are intravenous drug use or sexual activity with multiple partners). The donor's travel history is screened to minimize risk of transmitting infections, such as malaria.  The donated blood is tested for signs of infectious diseases, such as HIV and hepatitis. The blood is then tested to be sure it is compatible with you in order to minimize the chance of a transfusion reaction. If you or a relative donates blood, this is often done in anticipation of surgery and is not appropriate for emergency situations. It takes many days to process the donated blood. RISKS AND COMPLICATIONS Although transfusion therapy is very safe and saves many lives, the main dangers of transfusion include:  Getting an infectious disease. Developing a transfusion reaction. This is an allergic reaction to something in the blood you were given. Every precaution is taken to prevent this. The decision to have a blood transfusion has been considered carefully by your caregiver before blood is given. Blood is not given unless the benefits outweigh the risks.  AFTER SURGERY INSTRUCTIONS  Return to work: 4-6 weeks if applicable  Activity: 1. Be up and out of the bed during the day.  Take a nap if needed.  You may walk  up steps but be careful and use the hand rail.  Stair climbing will tire you more than you think, you may need to stop part way and rest.   2. No lifting or straining for 6 weeks over 10 pounds. No pushing, pulling, straining for 6 weeks.  3. No driving for 1 week(s).  Do not drive if you are taking narcotic pain medicine and make sure that your reaction time has returned.   4. You can shower as soon as the next day after surgery. Shower daily.  Use your regular soap and water (not directly on the incision) and pat your incision(s) dry afterwards; don't rub.  No tub baths or submerging your body in water until cleared by your surgeon. If you have the soap that was given to you by pre-surgical testing that was used before surgery, you do not need to use it afterwards because this can irritate your incisions.   5. No sexual activity and nothing in the vagina for 10-12 weeks.  6. You may experience a small amount of clear drainage from your incisions, which is normal.  If the drainage persists, increases, or changes color please call the office.  7. Do not use creams, lotions, or ointments such as neosporin on your incisions after surgery until advised by your surgeon because they can cause removal of the dermabond glue on your incisions.    8. You may experience vaginal spotting after surgery or around the 6-8 week mark from surgery when the stitches at the top of the vagina begin to dissolve.  The spotting is normal but if you experience heavy bleeding, call our office.  9. Take Tylenol or ibuprofen first for pain if you are able to take these medications and only use narcotic pain medication for severe pain not relieved by the Tylenol or Ibuprofen.  Monitor your Tylenol intake to a max of 4,000 mg in a 24 hour period. You can alternate these medications after surgery.  Diet: 1. Low sodium Heart Healthy Diet is recommended but you are cleared to resume your normal (before surgery) diet after your  procedure.  2. It is safe to use a laxative, such as Miralax or Colace, if you have difficulty moving your bowels. You have been prescribed Sennakot-S to take at bedtime every evening after surgery to keep bowel movements regular and to prevent constipation.    Wound Care: 1. Keep clean and dry.  Shower daily.  Reasons to call the Doctor: Fever - Oral temperature greater than 100.4 degrees Fahrenheit Foul-smelling vaginal discharge Difficulty urinating Nausea and vomiting Increased pain at the site of the incision that is unrelieved with pain medicine. Difficulty breathing with or without chest pain New calf pain especially if only on one side Sudden, continuing increased vaginal bleeding with or without clots.   Contacts: For questions or concerns you should contact:  Dr. Eugene GarnetKatherine Tucker at [phone]  Warner MccreedyMelissa Yehoshua Vitelli, NP at [phone]  After Hours: call [phone] and have the GYN Oncologist paged/contacted (after 5 pm or on the weekends). You will speak with an after hours RN and let he or she know you have had surgery.  Messages sent via mychart are for non-urgent matters and are not responded to after hours so for urgent needs, please call the after hours number.

## 2022-04-06 NOTE — Progress Notes (Signed)
Gynecologic Oncology Return Clinic Visit  04/06/22  Reason for Visit: surveillance in the setting of BRCA1 mutation  Treatment History: Oncology History  Ductal carcinoma in situ (DCIS) of right breast  04/11/2021 Initial Diagnosis   Screening mammogram detected right breast 2.7 cm indeterminate mass with calcifications, biopsy revealed low-grade DCIS with a focus of intermediate grade DCIS with necrosis involving intraductal papilloma, ER 100%, PR 15%   05/25/2021 Genetic Testing   GENETIC TEST RESULTS: Genetic testing reported through the CancerNext-Expanded+RNAinsight cancer panel found no additional pathogenic mutations, other than the original BRCA1 p.C1697R (c.5089T>C) Likely Pathogenic variant. The CancerNext-Expanded gene panel offered by [facility] and includes sequencing and rearrangement analysis for the following 77 genes: AIP, ALK, APC*, ATM*, AXIN2, BAP1, BARD1, BLM, BMPR1A, BRCA1*, BRCA2*, BRIP1*, CDC73, CDH1*, CDK4, CDKN1B, CDKN2A, CHEK2*, CTNNA1, DICER1, FANCC, FH, FLCN, GALNT12, KIF1B, LZTR1, MAX, MEN1, MET, MLH1*, MSH2*, MSH3, MSH6*, MUTYH*, NBN, NF1*, NF2, NTHL1, PALB2*, PHOX2B, PMS2*, POT1, PRKAR1A, PTCH1, PTEN*, RAD51C*, RAD51D*, RB1, RECQL, RET, SDHA, SDHAF2, SDHB, SDHC, SDHD, SMAD4, SMARCA4, SMARCB1, SMARCE1, STK11, SUFU, TMEM127, TP53*, TSC1, TSC2, VHL and XRCC2 (sequencing and deletion/duplication); EGFR, EGLN1, HOXB13, KIT, MITF, PDGFRA, POLD1, and POLE (sequencing only); EPCAM and GREM1    06/22/2021 Surgery   Bilateral mastectomies Left mastectomy: Fibrocystic changes Right mastectomy: DCIS with clear margins grade 2 ER 100% PR 15%   06/2021 -  Anti-estrogen oral therapy   Tamoxifen   12/28/2021 Cancer Staging   Staging form: Breast, AJCC 8th Edition - Clinical: Stage 0 (cTis (DCIS), cN0, cM0) - Signed by Loa Socksausey, Lindsey Cornetto, NP on 12/28/2021    Patient was diagnosed earlier this year with low-grade DCIS of the right breast after a mass was found on  screening mammogram.  She underwent bilateral mastectomies in June with the DCIS, grade 2, ER+.    Genetic testing performed in May of this year revealed a likely pathogenic variant in BRCA1, Z.O1096Ep.C1697R. Genetic testing also identified 4 variants of uncertain significance (VUS) - one in the AXIN2 gene called p.C241G, a second in the CDKN2A gene called p.V115L, a third in the [facility] gene called p.F198L and a fourth in the NTHL1 gene called p.A264T   Patient reports that she has healed well from surgery.  She is scheduled for removal of her tissue expanders with placement of bilateral breast implants next week on 9/15.   She has been on tamoxifen since June.  She is tolerating this well without significant side effects.  Pelvic ultrasound 01/2022: 1. Endometrium appears prominent at 8-9 mm for reported postmenopausal state. No focal abnormality is identified. Postmenopausal endometrial thickness is normally expected under the 5 mm range. However if there is no abnormal bleeding 8-11 mm is considered the upper limits of normal. Given history of malignancy and BRCA1 mutation, consider tissue sampling if not previously performed. 2. Previously described LEFT ovarian complicated cyst is no longer discretely visualized. There is a small benign appearing simple cyst which measures 12 mm x 4 x 11 mm (for which no dedicated imaging follow-up is recommended) . 3. Trace pelvic free fluid.  EMB on 10/31/21:  A. ENDOMETRIUM BIOPSY:  - BENIGN, INACTIVE ENDOMETRIUM.  - NEGATIVE FOR HYPERPLASIA OR MALIGNANCY.   Interval History: Patient is overall doing well.  Denies any vaginal bleeding since I last saw her.  Denies any pelvic or abdominal pain.  Reports baseline bowel bladder function.  Past Medical/Surgical History: Past Medical History:  Diagnosis Date   DCIS (ductal carcinoma in situ)    Family history  of colon cancer    Family history of ovarian cancer    Gestational diabetes    glyburide    Heart murmur    told with pregnancy--no symptoms   Tuberculosis    "when I was young" states she received treatment    Past Surgical History:  Procedure Laterality Date   APPENDECTOMY     BREAST RECONSTRUCTION WITH PLACEMENT OF TISSUE EXPANDER AND ALLODERM Bilateral 06/22/2021   Procedure: BREAST RECONSTRUCTION WITH PLACEMENT OF TISSUE EXPANDER AND ALLODERM;  Surgeon: Glenna Fellows, MD;  Location: [facility];  Service: Plastics;  Laterality: Bilateral;   REMOVAL OF BILATERAL TISSUE EXPANDERS WITH PLACEMENT OF BILATERAL BREAST IMPLANTS Bilateral 09/15/2021   Procedure: REMOVAL OF BILATERAL TISSUE EXPANDERS WITH PLACEMENT OF BILATERAL BREAST IMPLANTS;  Surgeon: Glenna Fellows, MD;  Location: [facility];  Service: Plastics;  Laterality: Bilateral;   SIMPLE MASTECTOMY WITH AXILLARY SENTINEL NODE BIOPSY Bilateral 06/22/2021   Procedure: BILATERAL  MASTECTOMIES WITH IMMEDIATE RECONSTRUCTION, MAGTRACE INJECTION RIGHT BREAST;  Surgeon: Manus Rudd, MD;  Location: [facility];  Service: General;  Laterality: Bilateral;  LMA PEC BLOCK   UPPER GASTROINTESTINAL ENDOSCOPY      Family History  Problem Relation Age of Onset   Uterine cancer Mother    Diabetes Father    Kidney disease Father    Diabetes Sister    Uterine cancer Sister    Diabetes Sister    Colon cancer Maternal Uncle    Esophageal cancer Neg Hx    Rectal cancer Neg Hx    Stomach cancer Neg Hx     Social History   Socioeconomic History   Marital status: Married    Spouse name: Onalee Hua   Number of children: 4   Years of education: Not on file   Highest education level: 10th grade  Occupational History   Occupation: work as Designer, multimedia  Tobacco Use   Smoking status: Never    Passive exposure: Past   Smokeless tobacco: Former    Types: Snuff, Designer, multimedia Use   Vaping Use: Never used  Substance and Sexual Activity   Alcohol use: No   Drug use: No   Sexual activity: Yes     Birth control/protection: None  Other Topics Concern   Not on file  Social History Narrative   Not on file   Social Determinants of Health   Financial Resource Strain: Not on file  Food Insecurity: Not on file  Transportation Needs: No Transportation Needs (03/20/2018)   PRAPARE - Administrator, Civil Service (Medical): No    Lack of Transportation (Non-Medical): No  Physical Activity: Not on file  Stress: Not on file  Social Connections: Not on file    Current Medications:  Current Outpatient Medications:    cholecalciferol (VITAMIN D3) 25 MCG (1000 UT) tablet, Take 1,000 Units by mouth daily., Disp: , Rfl:    Ferrous Sulfate (IRON) 325 (65 Fe) MG TABS, Take 2 tablets by mouth daily in the afternoon., Disp: , Rfl:    tamoxifen (NOLVADEX) 20 MG tablet, Take 1 tablet (20 mg total) by mouth daily., Disp: 90 tablet, Rfl: 3  Review of Systems: Denies appetite changes, fevers, chills, fatigue, unexplained weight changes. Denies hearing loss, neck lumps or masses, mouth sores, ringing in ears or voice changes. Denies cough or wheezing.  Denies shortness of breath. Denies chest pain or palpitations. Denies leg swelling. Denies abdominal distention, pain, blood in stools, constipation, diarrhea, nausea, vomiting, or early  satiety. Denies pain with intercourse, dysuria, frequency, hematuria or incontinence. Denies hot flashes, pelvic pain, vaginal bleeding or vaginal discharge.   Denies joint pain, back pain or muscle pain/cramps. Denies itching, rash, or wounds. Denies dizziness, headaches, numbness or seizures. Denies swollen lymph nodes or glands, denies easy bruising or bleeding. Denies anxiety, depression, confusion, or decreased concentration.  Physical Exam: BP 108/76 (BP Location: Right Arm, Patient Position: Sitting)   Pulse 84   Temp 98.8 F (37.1 C) (Oral)   Wt 124 lb 9.6 oz (56.5 kg)   SpO2 97%   BMI 22.50 kg/m  General: Alert, oriented, no acute  distress.  HEENT: Normocephalic, atraumatic. Sclera anicteric.  Chest: Clear to auscultation bilaterally. No wheezes, rhonchi, or rales. Cardiovascular: Regular rate and rhythm, no murmurs, rubs, or gallops.  Abdomen: Laxity of the anterior abdominal wall.  Normoactive bowel sounds. Soft, nondistended, nontender to palpation. No masses or hepatosplenomegaly appreciated. No palpable fluid wave.  Well-healed right lower quadrant incision. Extremities: Grossly normal range of motion. Warm, well perfused. No edema bilaterally.  Skin: No rashes or lesions. GU: Normal appearing external genitalia without erythema, excoriation, or lesions.  Bimanual exam reveals mobile small uterus, no adnexal masses appreciated.  No nodularity.  Laboratory & Radiologic Studies: Pelvic ultrasound 01/12/22: 1. Endometrium appears prominent at 8-9 mm for reported postmenopausal state. No focal abnormality is identified. Postmenopausal endometrial thickness is normally expected under the 5 mm range. However if there is no abnormal bleeding 8-11 mm is considered the upper limits of normal. Given history of malignancy and BRCA1 mutation, consider tissue sampling if not previously performed. 2. Previously described LEFT ovarian complicated cyst is no longer discretely visualized. There is a small benign appearing simple cyst which measures 12 mm x 4 x 11 mm (for which no dedicated imaging follow-up is recommended) . 3. Trace pelvic free fluid.  Assessment & Plan: Toni Crawford is a 47 y.o. woman with known BRCA1 mutation and recent diagnosis of DCIS.   The visit is conducted with the help of a video interpreter.  Briefly reviewed risk of ovarian cancer and BRCA 1 patient's as well as the small but increased risk of serous endometrial cancer.   Patient comes in today for an exam prior to scheduled risk-reducing surgery.   We discussed again the risk of finding occult cancer at the time of risk-reducing surgery,  estimated to be up to approximately 5%.  Given several month delay before surgery, I recommend that we proceed with pelvic ultrasound and CA-125.  If there were any findings concerning for cancer at the time of her surgery, I would plan to send the adnexa for frozen section.  The goal would be to do her staging surgery at the time of her risk-reducing surgery if cancer encountered.  We discussed the additional staging procedures including peritoneal biopsies, omentectomy, and possible lymph node removal.   We discussed the plan for a robotic assisted hysterectomy, bilateral salpingo-oophorectomy, possible staging, possible laparotomy. The risks of surgery were discussed in detail and she understands these to include infection; wound separation; hernia; vaginal cuff separation, injury to adjacent organs such as bowel, bladder, blood vessels, ureters and nerves; bleeding which may require blood transfusion; anesthesia risk; thromboembolic events; possible death; unforeseen complications; possible need for re-exploration; medical complications such as heart attack, stroke, pleural effusion and pneumonia; and, if full lymphadenectomy is performed the risk of lymphedema and lymphocyst. The patient will receive DVT and antibiotic prophylaxis as indicated. She voiced a clear understanding. She  had the opportunity to ask questions.    The patient will return closer to the date of surgery for a preoperative visit with [facility].  28 minutes of total time was spent for this patient encounter, including preparation, face-to-face counseling with the patient and coordination of care, and documentation of the encounter.  Eugene Garnet, MD  Division of Gynecologic Oncology  Department of Obstetrics and Gynecology  [facility]

## 2022-04-08 LAB — CA 125: Cancer Antigen (CA) 125: 12.3 U/mL (ref 0.0–38.1)

## 2022-04-09 ENCOUNTER — Telehealth: Payer: Self-pay

## 2022-04-09 NOTE — Telephone Encounter (Signed)
-----   Message from [name], NP sent at 04/09/2022  8:14 AM EDT ----- Please let her know her CA 125 is stable and within normal range ----- Message ----- From: Interface, Lab In [place] Sent: 04/08/2022   3:35 PM EDT To: [name], NP

## 2022-04-09 NOTE — Telephone Encounter (Signed)
VIA [employer] Interpreter ID# [id number], LVM for patient to call office regarding normal CA125 result.

## 2022-04-10 NOTE — Telephone Encounter (Signed)
I spoke to pt's husband Onalee Hua. He is aware of the normal CA125 and will let Nyaja know.

## 2022-04-12 NOTE — Progress Notes (Signed)
UPDATE: AXIN2 p.C241G VUS has been reclassified as likely benign.  The amended report date is March 29, 2022.

## 2022-04-18 IMAGING — MG MM BREAST LOCALIZATION CLIP
4 series · 4 of 12 positions shown · non-contrast
Comparison: Previous exam(s).

CLINICAL DATA: Evaluate post biopsy marker clip placement following
ultrasound-guided core needle biopsy of a right breast mass.

EXAM:
3D DIAGNOSTIC RIGHT MAMMOGRAM POST ULTRASOUND BIOPSY

[R ML synth-2D]
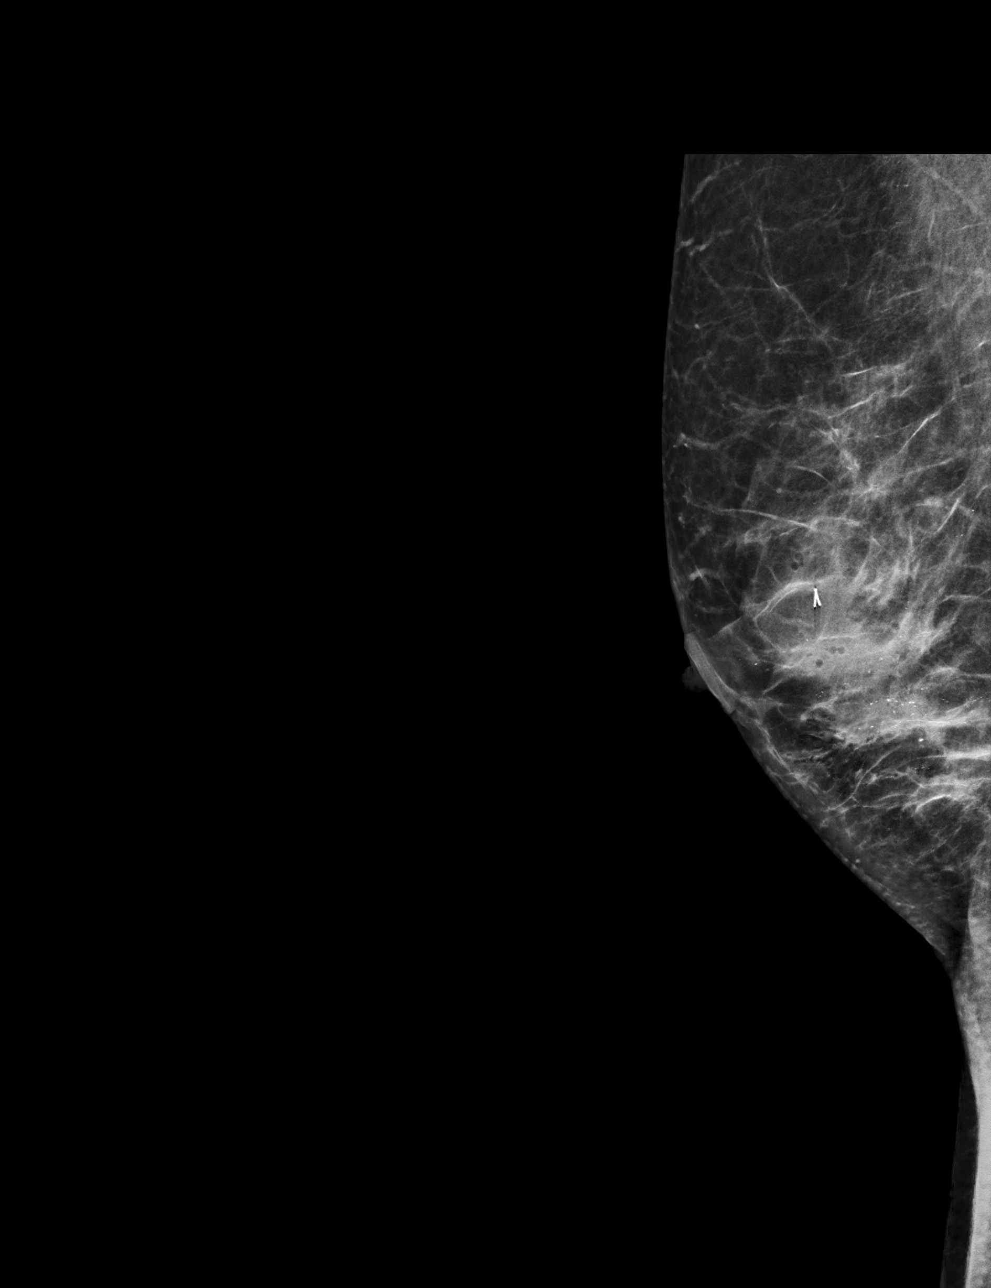

[R CC synth-2D]
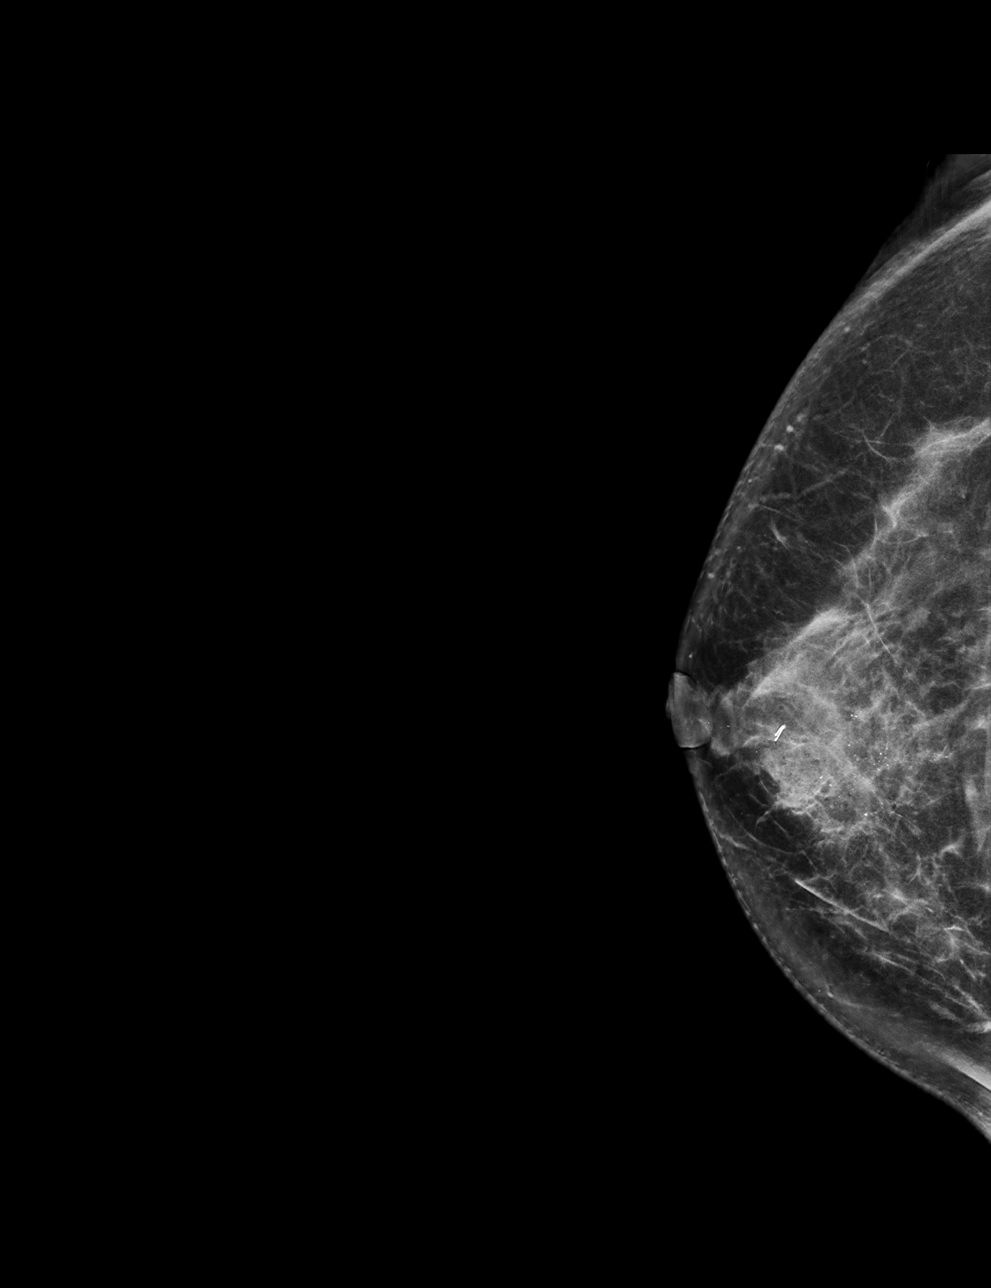

[R ML tomo · tomo slice 32/63.0]
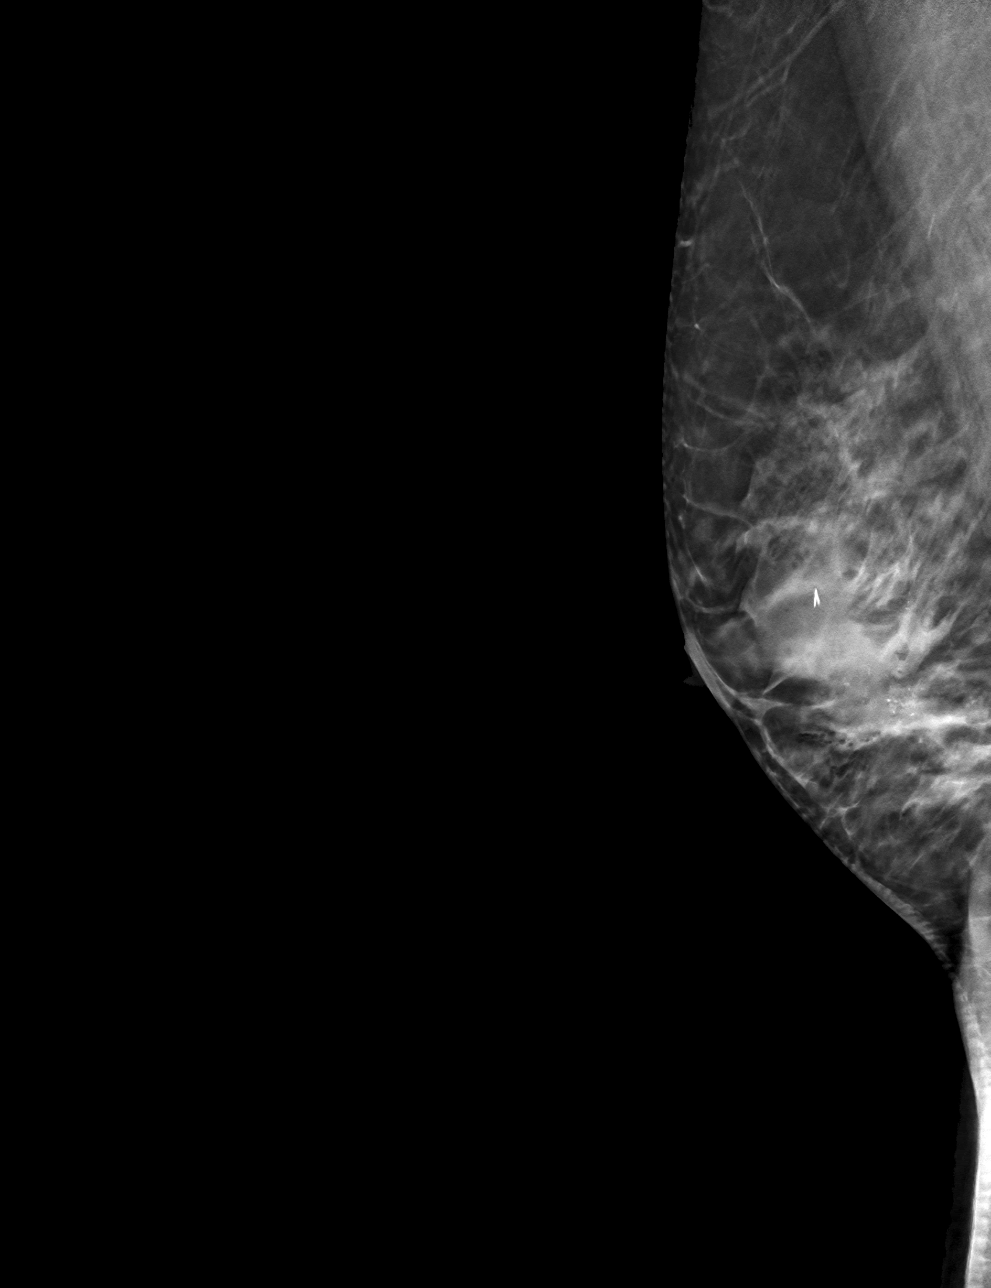

[R CC tomo · tomo slice 35/68.0]
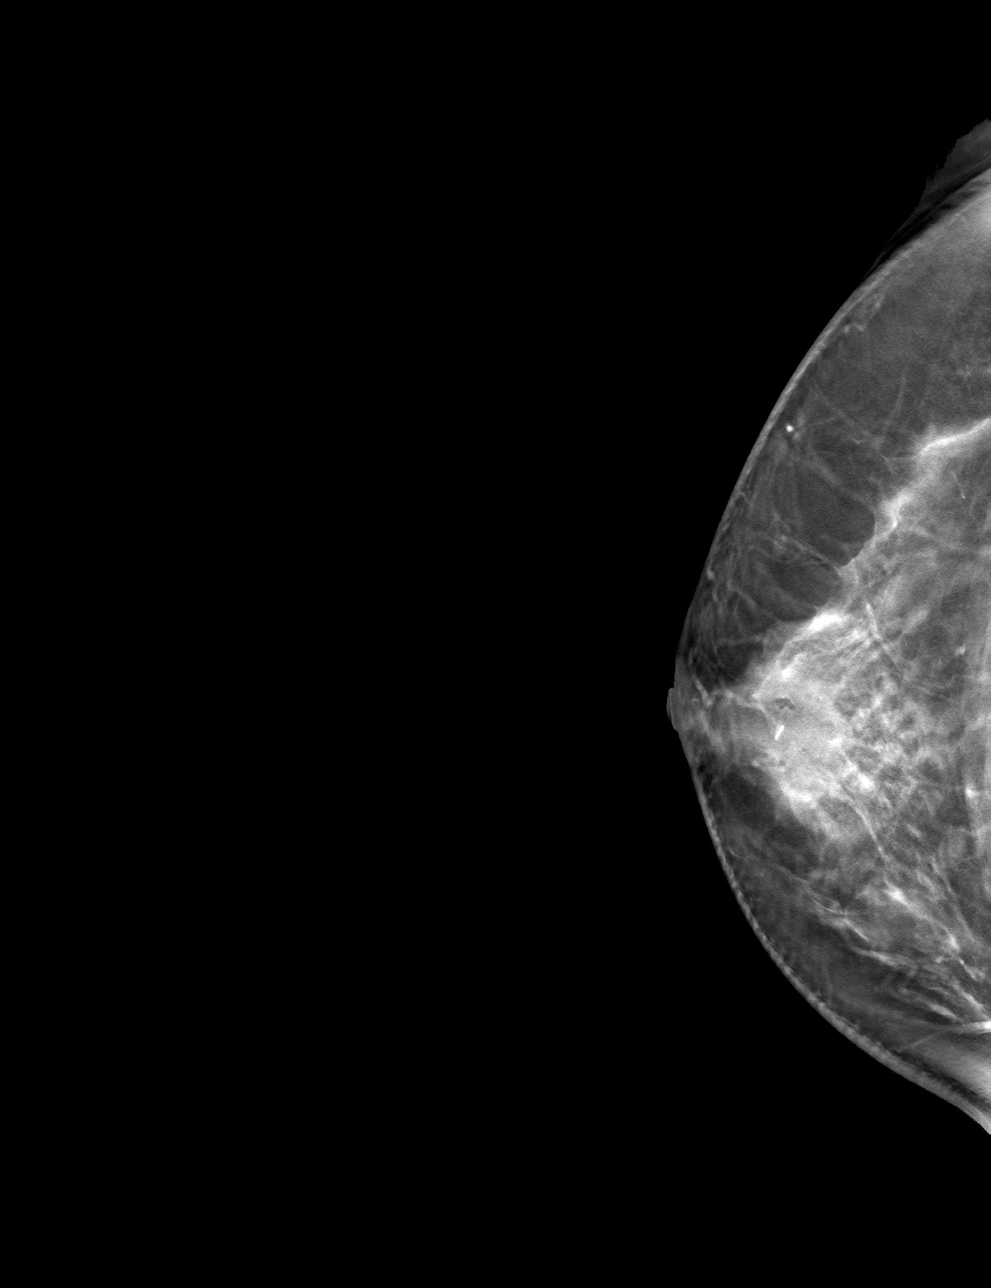

[4 of 12 positions shown; findings below may reference images not displayed]

FINDINGS: 3D Mammographic images were obtained following ultrasound guided
biopsy of a cystic appearing mass in the 3 o'clock retroareolar
right breast. The biopsy marking clip is in expected position at the
site of biopsy. The ribbon clip lies in the retroareolar breast,
slightly medial to midline. There calcifications which lie posterior
and inferomedial to this.
IMPRESSION: Appropriate positioning of the ribbon shaped biopsy marking clip at
the site of biopsy in the 3 o'clock retroareolar right breast.

Final Assessment: Post Procedure Mammograms for Marker Placement

## 2022-05-21 ENCOUNTER — Ambulatory Visit
Admission: EM | Admit: 2022-05-21 | Discharge: 2022-05-21 | Disposition: A | Discharge status: Home / Self Care | Payer: Medicaid Other | Attending: Internal Medicine | Admitting: Internal Medicine

## 2022-05-21 DIAGNOSIS — K5903 Drug induced constipation: Secondary | ICD-10-CM | POA: Diagnosis not present

## 2022-05-21 LAB — POCT URINALYSIS DIP (MANUAL ENTRY)
Bilirubin, UA: NEGATIVE
Glucose, UA: NEGATIVE mg/dL
Ketones, POC UA: NEGATIVE mg/dL
Leukocytes, UA: NEGATIVE
Nitrite, UA: NEGATIVE
Protein Ur, POC: NEGATIVE mg/dL
Spec Grav, UA: 1.01 (ref 1.010–1.025)
Urobilinogen, UA: 0.2 E.U./dL
pH, UA: 6 (ref 5.0–8.0)

## 2022-05-21 MED ORDER — DOCUSATE SODIUM 100 MG PO CAPS: 100.0000 mg | ORAL_CAPSULE | Freq: Every day | ORAL | 0 refills | Status: DC

## 2022-05-21 NOTE — ED Provider Notes (Signed)
[name]    CSN: [id number] Arrival date & time: 05/21/22  1221      History   Chief Complaint Chief Complaint  Patient presents with   Abdominal Pain    HPI Toni Crawford is a 47 y.o. female.   Patient with history of right sided breast cancer (06/2021) presents to urgent care for evaluation of left lower abdominal pain that started 2 days ago on Saturday May 19, 2022 in the evening. She was resting when the pain started. She has a difficult time describing the pain. Pain is currently a 3-4 on a scale of 0-10. Sleeping and sitting still helps the pain and moving around makes the pain worse. Denies dysuria, urinary frequency, and urinary urgency, but states the pain improves after she voids. Last normal bowel movement was this morning. She takes iron and states this makes her stool a little harder than normal. No recent blood/mucous to the stools. Pain is not worse when she is having bowel movements and denies straining to defecate. Denies recent nausea, vomiting, diarrhea, or fever/chills. Ibuprofen helps with pain and she last took this last night.  Patient wonders if this could be her uterus and states that she is scheduled for a hysterectomy on June 07, 2022.  The history is provided by the patient. The history is limited by a language barrier. A language interpreter was used Radio broadcast assistant medical interpreter used for entirety of visit.).  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   Gestational diabetes mellitus in third trimester 06/22/2013   AMA 04/13/2013   Supervision of other normal pregnancy 04/13/2013   Language barrier 04/13/2013    Past Surgical History:  Procedure Laterality Date   APPENDECTOMY     BREAST RECONSTRUCTION WITH PLACEMENT OF TISSUE EXPANDER AND ALLODERM Bilateral 06/22/2021   Procedure: BREAST RECONSTRUCTION WITH PLACEMENT OF TISSUE EXPANDER AND ALLODERM;  Surgeon: Glenna Fellows, MD;  Location: [facility];  Service: Plastics;  Laterality: Bilateral;   REMOVAL OF BILATERAL TISSUE EXPANDERS WITH PLACEMENT OF BILATERAL BREAST IMPLANTS Bilateral 09/15/2021   Procedure: REMOVAL OF BILATERAL TISSUE EXPANDERS WITH PLACEMENT OF BILATERAL BREAST IMPLANTS;  Surgeon: Glenna Fellows, MD;  Location: [facility];  Service: Plastics;  Laterality: Bilateral;   SIMPLE MASTECTOMY WITH AXILLARY SENTINEL NODE BIOPSY Bilateral 06/22/2021   Procedure: BILATERAL  MASTECTOMIES WITH IMMEDIATE RECONSTRUCTION, MAGTRACE INJECTION RIGHT BREAST;  Surgeon: Manus Rudd, MD;  Location: [facility];  Service: General;  Laterality: Bilateral;  LMA PEC BLOCK   UPPER GASTROINTESTINAL ENDOSCOPY      OB History     Gravida  6   Para  5   Term  5   Preterm      AB  1   Living  4      SAB  1   IAB      Ectopic      Multiple      Live Births  5            Home Medications    Prior to Admission medications   Medication Sig Start Date End Date Taking? Authorizing Provider  cholecalciferol (VITAMIN D3) 25 MCG (1000 UT) tablet Take 1,000 Units by mouth daily.    [provider]  Ferrous Sulfate (IRON) 325 (65 Fe) MG TABS Take 2 tablets by mouth daily in the afternoon.    [provider]  tamoxifen (NOLVADEX) 20 MG tablet Take 1 tablet (20 mg total) by mouth daily. 10/13/21   Serena Croissant, MD     Family History Family History  Problem Relation Age of Onset   Uterine cancer Mother    Diabetes Father    Kidney disease Father    Diabetes Sister    Uterine cancer Sister    Diabetes Sister    Colon cancer Maternal Uncle    Esophageal cancer Neg Hx    Rectal cancer Neg Hx    Stomach cancer Neg Hx     Social History Social History   Tobacco Use   Smoking status: Never    Passive exposure: Past   Smokeless tobacco: Former    Types: Snuff, Chew  Vaping Use   Vaping Use: Never used  Substance Use Topics   Alcohol use: No   Drug use: No     Allergies   Patient has no known allergies.   Review of Systems Review of Systems  Gastrointestinal:  Positive for abdominal pain.  Per HPI   Physical Exam Triage Vital Signs ED Triage Vitals  Enc Vitals Group     BP 05/21/22 1343 105/72     Pulse Rate 05/21/22 1343 67     Resp 05/21/22 1343 18     Temp 05/21/22 1343 98.3 F (36.8 C)     Temp Source 05/21/22 1343 Oral     SpO2 05/21/22 1343 98 %     Weight --      Height --      Head Circumference --      Peak Flow --      Pain Score 05/21/22 1342 4     Pain Loc --      Pain Edu? --      Excl. in GC? --    No data found.  Updated Vital Signs BP 105/72 (BP Location: Left Arm)   Pulse 67   Temp 98.3 F (36.8 C) (Oral)   Resp 18   LMP 08/29/2021 (Approximate)   SpO2 98%   Visual Acuity Right Eye Distance:   Left Eye Distance:   Bilateral Distance:    Right Eye Near:   Left Eye Near:    Bilateral Near:     Physical Exam Vitals and nursing note reviewed.  Constitutional:      Appearance: She is not ill-appearing or toxic-appearing.  HENT:     Head: Normocephalic and atraumatic.     Right Ear: Hearing and external ear normal.     Left Ear: Hearing and external ear normal.     Nose: Nose normal.     Mouth/Throat:     Lips: Pink.  Eyes:     General: Lids are normal. Vision grossly intact. Gaze aligned appropriately.     Extraocular Movements:  Extraocular movements intact.     Conjunctiva/sclera: Conjunctivae normal.  Cardiovascular:  Rate and Rhythm: Normal rate and regular rhythm.     Heart sounds: Normal heart sounds, S1 normal and S2 normal.  Pulmonary:     Effort: Pulmonary effort is normal. No respiratory distress.     Breath sounds: Normal breath sounds and air entry.  Abdominal:     General: Abdomen is flat. Bowel sounds are increased.     Palpations: Abdomen is soft.     Tenderness: There is no abdominal tenderness. There is no right CVA tenderness, left CVA tenderness or guarding.     Comments: Nontender to abdominal exam. No peritoneal signs elicited.   Musculoskeletal:     Cervical back: Neck supple.  Skin:    General: Skin is warm and dry.     Capillary Refill: Capillary refill takes less than 2 seconds.     Findings: No rash.  Neurological:     General: No focal deficit present.     Mental Status: She is alert and oriented to person, place, and time. Mental status is at baseline.     Cranial Nerves: No dysarthria or facial asymmetry.  Psychiatric:        Mood and Affect: Mood normal.        Speech: Speech normal.        Behavior: Behavior normal.        Thought Content: Thought content normal.        Judgment: Judgment normal.      UC Treatments / Results  Labs (all labs ordered are listed, but only abnormal results are displayed) Labs Reviewed  POCT URINALYSIS DIP (MANUAL ENTRY) - Abnormal; Notable for the following components:      Result Value   Blood, UA small (*)    All other components within normal limits    EKG   Radiology No results found.  Procedures Procedures (including critical care time)  Medications Ordered in UC Medications - No data to display  Initial Impression / Assessment and Plan / UC Course  I have reviewed the triage vital signs and the nursing notes.  Pertinent labs & imaging results that were available during my care of the patient were reviewed by me and  considered in my medical decision making (see chart for details).   1.  Drug-induced constipation Abdominal exam is without peritoneal signs and she is currently nontender.  Increased bowel sounds heard to auscultation of the abdomen.  I am concerned for possible constipation related to iron usage (iron deficiency anemia).  No blood or mucus to the stools.  No red flag signs or symptoms indicating need for immediate transfer to higher level of care for further evaluation or advanced imaging.  Patient is agreeable with plan.  Advised to follow-up with her primary care provider as needed for ongoing evaluation of symptoms should they fail to improve.  Vital signs are hemodynamically stable and patient is overall well in appearance.  Encouraged adequate water intake and fiber intake as well.  Discussed physical exam and available lab work findings in clinic with patient.  Counseled patient regarding appropriate use of medications and potential side effects for all medications recommended or prescribed today. Discussed red flag signs and symptoms of worsening condition,when to call the PCP office, return to urgent care, and when to seek higher level of care in the emergency department. Patient verbalizes understanding and agreement with plan. All questions answered. Patient discharged in stable condition.    Final Clinical Impressions(s) / UC Diagnoses   Final diagnoses:  Drug-induced constipation  Discharge Instructions      Your abdominal pain is likely due to some mild constipation.  Please take Colace stool softener once daily to help promote soft and normal bowel movements consistently.  Drink plenty of water and eat plenty of vegetables to help keep your bowel movements nice and soft.  Follow-up with your primary care provider as needed for any ongoing abdominal discomfort.  You may continue taking Tylenol and ibuprofen as needed to help with the pain.  You may also find relief from  placing heat to your abdomen 20 minutes on 20 minutes off to reduce pain and spasm.  If you develop any new or worsening symptoms or do not improve in the next 2 to 3 days, please return.  If your symptoms are severe, please go to the emergency room.  Follow-up with your primary care provider for further evaluation and management of your symptoms as well as ongoing wellness visits.  I hope you feel better!     ED Prescriptions   None    PDMP not reviewed this encounter.   Carlisle Beers, [place] 05/21/22 1441

## 2022-05-21 NOTE — Discharge Instructions (Addendum)
Your abdominal pain is likely due to some mild constipation.  Please take Colace stool softener once daily to help promote soft and normal bowel movements consistently.  Drink plenty of water and eat plenty of vegetables to help keep your bowel movements nice and soft.  Follow-up with your primary care provider as needed for any ongoing abdominal discomfort.  You may continue taking Tylenol and ibuprofen as needed to help with the pain.  You may also find relief from placing heat to your abdomen 20 minutes on 20 minutes off to reduce pain and spasm.  If you develop any new or worsening symptoms or do not improve in the next 2 to 3 days, please return.  If your symptoms are severe, please go to the emergency room.  Follow-up with your primary care provider for further evaluation and management of your symptoms as well as ongoing wellness visits.  I hope you feel better!

## 2022-05-21 NOTE — ED Triage Notes (Signed)
Pt presentswith c/o abd pain x 3 days.   Denies n/v/d and fever.   Home interventions: otc pain reducers

## 2022-05-24 ENCOUNTER — Inpatient Hospital Stay: Payer: Medicaid Other | Attending: Gynecologic Oncology | Admitting: Gynecologic Oncology

## 2022-05-24 ENCOUNTER — Other Ambulatory Visit: Payer: Self-pay

## 2022-05-24 VITALS — BP 114/78 | HR 80 | Temp 98.7°F | Resp 16 | Wt 126.0 lb

## 2022-05-24 DIAGNOSIS — Z1509 Genetic susceptibility to other malignant neoplasm: Secondary | ICD-10-CM

## 2022-05-24 DIAGNOSIS — Z1501 Genetic susceptibility to malignant neoplasm of breast: Secondary | ICD-10-CM

## 2022-05-24 MED ORDER — TRAMADOL HCL 50 MG PO TABS: 50.0000 mg | ORAL_TABLET | Freq: Four times a day (QID) | ORAL | 0 refills | Status: DC | PRN

## 2022-05-24 MED ORDER — SENNOSIDES-DOCUSATE SODIUM 8.6-50 MG PO TABS: 2.0000 | ORAL_TABLET | Freq: Every day | ORAL | 0 refills | Status: DC

## 2022-05-24 MED ORDER — IBUPROFEN 800 MG PO TABS: 800.0000 mg | ORAL_TABLET | Freq: Three times a day (TID) | ORAL | 0 refills | Status: DC | PRN

## 2022-05-24 NOTE — Progress Notes (Signed)
Patient here for a pre-operative appointment prior to her scheduled surgery on June 07, 2022. She is scheduled for a robotic assisted total laparoscopic hysterectomy, bilateral salpingo-oophorectomy, sentinel lymph node biopsy, possible lymph node dissection, possible laparotomy.  She had her pre-admission testing appointment this am at [facility].  The surgery was discussed in detail.  See after visit summary for additional details. Visual aids used to discuss items related to surgery including the incentive spirometer, sequential compression stockings, foley catheter, IV pump, multi-modal pain regimen including tylenol, photo of the surgical robot, female reproductive system to discuss surgery in detail.      Discussed post-op pain management in detail including the aspects of the enhanced recovery pathway.  Advised her that a new prescription would be sent in for Tramadol and it is only to be used for after her upcoming surgery.  We discussed the use of tylenol post-op and to monitor for a maximum of 4,000 mg in a 24 hour period.  Also prescribed sennakot to be used after surgery and to hold if having loose stools.  Discussed bowel regimen in detail.     Discussed the use of SCDs and measures to take at home to prevent DVT including frequent mobility.  Reportable signs and symptoms of DVT discussed. Post-operative instructions discussed and expectations for after surgery. Incisional care discussed as well including reportable signs and symptoms including erythema, drainage, wound separation.     30 minutes spent with the patient.  Verbalizing understanding of material discussed. No needs or concerns voiced at the end of the visit.   Advised patient to call for any needs.  Advised that her post-operative medications had been prescribed and could be picked up at any time.    This appointment is included in the global surgical bundle as pre-operative teaching and has no charge.

## 2022-05-24 NOTE — Patient Instructions (Addendum)
Preparing for your Surgery  Plan for surgery on June 07, 2022 with Dr. Eugene Garnet at [facility]. You will be scheduled for robotic assisted total laparoscopic hysterectomy (removal of the uterus and cervix), bilateral salpingo-oophorectomy (removal of both ovaries and fallopian tubes), sentinel lymph node biopsy, possible lymph node dissection, possible laparotomy (larger incision on your abdomen if needed).  Plan to stop taking your tamoxifen one week before surgery and restart one week after.  Pre-operative Testing -You will receive a phone call from presurgical testing at [facility] to arrange for a pre-operative appointment and lab work.  -Bring your insurance card, copy of an advanced directive if applicable, medication list  -At that visit, you will be asked to sign a consent for a possible blood transfusion in case a transfusion becomes necessary during surgery.  The need for a blood transfusion is rare but having consent is a necessary part of your care.     -You should not be taking blood thinners or aspirin at least ten days prior to surgery unless instructed by your surgeon.  -Do not take supplements such as fish oil (omega 3), red yeast rice, turmeric before your surgery. You want to avoid medications with aspirin in them including headache powders such as BC or Goody's), Excedrin migraine.  Day Before Surgery at Home -You will be asked to take in a light diet the day before surgery. You will be advised you can have clear liquids up until 3 hours before your surgery.    Eat a light diet the day before surgery.  Examples including soups, broths, toast, yogurt, mashed potatoes.  AVOID GAS PRODUCING FOODS AND BEVERAGES. Things to avoid include carbonated beverages (fizzy beverages, sodas), raw fruits and raw vegetables (uncooked), or beans.   If your bowels are filled with gas, your surgeon will have difficulty visualizing your pelvic organs which increases  your surgical risks.  Your role in recovery Your role is to become active as soon as directed by your doctor, while still giving yourself time to heal.  Rest when you feel tired. You will be asked to do the following in order to speed your recovery:  - Cough and breathe deeply. This helps to clear and expand your lungs and can prevent pneumonia after surgery.  - STAY ACTIVE WHEN YOU GET HOME. Do mild physical activity. Walking or moving your legs help your circulation and body functions return to normal. Do not try to get up or walk alone the first time after surgery.   -If you develop swelling on one leg or the other, pain in the back of your leg, redness/warmth in one of your legs, please call the office or go to the Emergency Room to have a doppler to rule out a blood clot. For shortness of breath, chest pain-seek care in the Emergency Room as soon as possible. - Actively manage your pain. Managing your pain lets you move in comfort. We will ask you to rate your pain on a scale of zero to 10. It is your responsibility to tell your doctor or nurse where and how much you hurt so your pain can be treated.  Special Considerations -If you are diabetic, you may be placed on insulin after surgery to have closer control over your blood sugars to promote healing and recovery.  This does not mean that you will be discharged on insulin.  If applicable, your oral antidiabetics will be resumed when you are tolerating a solid diet.  -Your  final pathology results from surgery should be available around one week after surgery and the results will be relayed to you when available.  -FMLA forms can be faxed to [phone] and please allow 5-7 business days for completion.  Pain Management After Surgery -You have been prescribed your pain medication and bowel regimen medications before surgery so that you can have these available when you are discharged from the hospital. The pain medication is for use ONLY AFTER  surgery and a new prescription will not be given.   -Make sure that you have Tylenol and Ibuprofen IF YOU ARE ABLE TO TAKE THESE MEDICATIONS at home to use on a regular basis after surgery for pain control. We recommend alternating the medications every hour to six hours since they work differently and are processed in the body differently for pain relief.  -Review the attached handout on narcotic use and their risks and side effects.   Bowel Regimen -You have been prescribed Sennakot-S to take nightly to prevent constipation especially if you are taking the narcotic pain medication intermittently.  It is important to prevent constipation and drink adequate amounts of liquids. You can stop taking this medication when you are not taking pain medication and you are back on your normal bowel routine.  Risks of Surgery Risks of surgery are low but include bleeding, infection, damage to surrounding structures, re-operation, blood clots, and very rarely death.   Blood Transfusion Information (For the consent to be signed before surgery)  We will be checking your blood type before surgery so in case of emergencies, we will know what type of blood you would need.                                            WHAT IS A BLOOD TRANSFUSION?  A transfusion is the replacement of blood or some of its parts. Blood is made up of multiple cells which provide different functions. Red blood cells carry oxygen and are used for blood loss replacement. White blood cells fight against infection. Platelets control bleeding. Plasma helps clot blood. Other blood products are available for specialized needs, such as hemophilia or other clotting disorders. BEFORE THE TRANSFUSION  Who gives blood for transfusions?  You may be able to donate blood to be used at a later date on yourself (autologous donation). Relatives can be asked to donate blood. This is generally not any safer than if you have received blood from a  stranger. The same precautions are taken to ensure safety when a relative's blood is donated. Healthy volunteers who are fully evaluated to make sure their blood is safe. This is blood bank blood. Transfusion therapy is the safest it has ever been in the practice of medicine. Before blood is taken from a donor, a complete history is taken to make sure that person has no history of diseases nor engages in risky social behavior (examples are intravenous drug use or sexual activity with multiple partners). The donor's travel history is screened to minimize risk of transmitting infections, such as malaria. The donated blood is tested for signs of infectious diseases, such as HIV and hepatitis. The blood is then tested to be sure it is compatible with you in order to minimize the chance of a transfusion reaction. If you or a relative donates blood, this is often done in anticipation of surgery and is not appropriate  for emergency situations. It takes many days to process the donated blood. RISKS AND COMPLICATIONS Although transfusion therapy is very safe and saves many lives, the main dangers of transfusion include:  Getting an infectious disease. Developing a transfusion reaction. This is an allergic reaction to something in the blood you were given. Every precaution is taken to prevent this. The decision to have a blood transfusion has been considered carefully by your caregiver before blood is given. Blood is not given unless the benefits outweigh the risks.  AFTER SURGERY INSTRUCTIONS  Return to work: 4-6 weeks if applicable  Activity: 1. Be up and out of the bed during the day.  Take a nap if needed.  You may walk up steps but be careful and use the hand rail.  Stair climbing will tire you more than you think, you may need to stop part way and rest.   2. No lifting or straining for 6 weeks over 10 pounds. No pushing, pulling, straining for 6 weeks.  3. No driving for around 1 week(s).  Do not drive  if you are taking narcotic pain medicine and make sure that your reaction time has returned.   4. You can shower as soon as the next day after surgery. Shower daily.  Use your regular soap and water (not directly on the incision) and pat your incision(s) dry afterwards; don't rub.  No tub baths or submerging your body in water until cleared by your surgeon. If you have the soap that was given to you by pre-surgical testing that was used before surgery, you do not need to use it afterwards because this can irritate your incisions.   5. No sexual activity and nothing in the vagina for 10-12 weeks.  6. You may experience a small amount of clear drainage from your incisions, which is normal.  If the drainage persists, increases, or changes color please call the office.  7. Do not use creams, lotions, or ointments such as neosporin on your incisions after surgery until advised by your surgeon because they can cause removal of the dermabond glue on your incisions.    8. You may experience vaginal spotting after surgery or around the 6-8 week mark from surgery when the stitches at the top of the vagina begin to dissolve.  The spotting is normal but if you experience heavy bleeding, call our office.  9. Take Tylenol or ibuprofen first for pain if you are able to take these medications and only use narcotic pain medication for severe pain not relieved by the Tylenol or Ibuprofen.  Monitor your Tylenol intake to a max of 4,000 mg in a 24 hour period. You can alternate these medications after surgery.  Diet: 1. Low sodium Heart Healthy Diet is recommended but you are cleared to resume your normal (before surgery) diet after your procedure.  2. It is safe to use a laxative, such as Miralax or Colace, if you have difficulty moving your bowels. You have been prescribed Sennakot-S to take at bedtime every evening after surgery to keep bowel movements regular and to prevent constipation.    Wound Care: 1. Keep  clean and dry.  Shower daily.  Reasons to call the Doctor: Fever - Oral temperature greater than 100.4 degrees Fahrenheit Foul-smelling vaginal discharge Difficulty urinating Nausea and vomiting Increased pain at the site of the incision that is unrelieved with pain medicine. Difficulty breathing with or without chest pain New calf pain especially if only on one side Sudden, continuing increased  vaginal bleeding with or without clots.   Contacts: For questions or concerns you should contact:  Dr. Eugene Garnet at [phone]  Warner Mccreedy, NP at [phone]  After Hours: call [phone] and have the GYN Oncologist paged/contacted (after 5 pm or on the weekends). You will speak with an after hours RN and let he or she know you have had surgery.  Messages sent via mychart are for non-urgent matters and are not responded to after hours so for urgent needs, please call the after hours number.

## 2022-05-30 NOTE — Progress Notes (Signed)
Used Ipad interpreter [phone] for appointment. Patient does know a little [place].  COVID Vaccine Completed: no  Date of COVID positive in last 90 days: no  PCP - Eugene Garnet, MD Cardiologist - n/a  Chest x-ray - n/a EKG - n/a Stress Test - in 2012 per pt ECHO - n/a Cardiac Cath - n/a Pacemaker/ICD device last checked: n/a Spinal Cord Stimulator: n/a  Bowel Prep - light diet day before  Sleep Study - n/a CPAP -   Fasting Blood Sugar - gestational DM, no medications per pt Checks Blood Sugar _____ times a day  Last dose of GLP1 agonist-  N/A GLP1 instructions:  N/A   Last dose of SGLT-2 inhibitors-  N/A SGLT-2 instructions: N/A   Blood Thinner Instructions:  n/a Aspirin Instructions: Last Dose:  Activity level: Can go up a flight of stairs and perform activities of daily living without stopping and without symptoms of chest pain or shortness of breath.   Anesthesia review:   Patient denies shortness of breath, fever, cough and chest pain at PAT appointment  Patient verbalized understanding of instructions that were given to them at the PAT appointment. Patient was also instructed that they will need to review over the PAT instructions again at home before surgery.

## 2022-05-30 NOTE — Patient Instructions (Signed)
SURGICAL WAITING ROOM VISITATION  Patients having surgery or a procedure may have no more than 2 support people in the waiting area - these visitors may rotate.    Children under the age of 44 must have an adult with them who is not the patient.  Due to an increase in RSV and influenza rates and associated hospitalizations, children ages 85 and under may not visit patients in [facility] hospitals.  If the patient needs to stay at the hospital during part of their recovery, the visitor guidelines for inpatient rooms apply. Pre-op nurse will coordinate an appropriate time for 1 support person to accompany patient in pre-op.  This support person may not rotate.    Please refer to the [facility] website for the visitor guidelines for Inpatients (after your surgery is over and you are in a regular room).    Your procedure is scheduled on: 06/07/22   Report to [facility] Main Entrance    Report to admitting at 5:15 AM   Call this number if you have problems the morning of surgery [phone]   Do not eat food :After Midnight.   After Midnight you may have the following liquids until 4:30 AM DAY OF SURGERY  Water Non-Citrus Juices (without pulp, NO RED-Apple, White grape, White cranberry) Black Coffee (NO MILK/CREAM OR CREAMERS, sugar ok)  Clear Tea (NO MILK/CREAM OR CREAMERS, sugar ok) regular and decaf                             Plain Jell-O (NO RED)                                           Fruit ices (not with fruit pulp, NO RED)                                     Popsicles (NO RED)                                                               Sports drinks like Gatorade (NO RED)                       If you have questions, please contact your surgeon's office.   FOLLOW BOWEL PREP AND ANY ADDITIONAL PRE OP INSTRUCTIONS YOU RECEIVED FROM YOUR SURGEON'S OFFICE!!!     Oral Hygiene is also important to reduce your risk of infection.                                     Remember - BRUSH YOUR TEETH THE MORNING OF SURGERY WITH YOUR REGULAR TOOTHPASTE  DENTURES WILL BE REMOVED PRIOR TO SURGERY PLEASE DO NOT APPLY "Poly grip" OR ADHESIVES!!!   Take these medicines the morning of surgery with A SIP OF WATER: None  You may not have any metal on your body including hair pins, jewelry, and body piercing             Do not wear make-up, lotions, powders, perfumes, or deodorant  Do not wear nail polish including gel and S&S, artificial/acrylic nails, or any other type of covering on natural nails including finger and toenails. If you have artificial nails, gel coating, etc. that needs to be removed by a nail salon please have this removed prior to surgery or surgery may need to be canceled/ delayed if the surgeon/ anesthesia feels like they are unable to be safely monitored.   Do not shave  48 hours prior to surgery.    Do not bring valuables to the hospital. [facility] IS NOT             RESPONSIBLE   FOR VALUABLES.   Contacts, glasses, dentures or bridgework may not be worn into surgery.  DO NOT BRING YOUR HOME MEDICATIONS TO THE HOSPITAL. PHARMACY WILL DISPENSE MEDICATIONS LISTED ON YOUR MEDICATION LIST TO YOU DURING YOUR ADMISSION IN THE HOSPITAL!    Patients discharged on the day of surgery will not be allowed to drive home.  Someone NEEDS to stay with you for the first 24 hours after anesthesia.              Please read over the following fact sheets you were given: IF YOU HAVE QUESTIONS ABOUT YOUR PRE-OP INSTRUCTIONS PLEASE CALL [phone] Contras    If you received a COVID test during your pre-op visit  it is requested that you wear a mask when out in public, stay away from anyone that may not be feeling well and notify your surgeon if you develop symptoms. If you test positive for Covid or have been in contact with anyone that has tested positive in the last 10 days please notify you surgeon.     - Preparing  for Surgery Before surgery, you can play an important role.  Because skin is not sterile, your skin needs to be as free of germs as possible.  You can reduce the number of germs on your skin by washing with CHG (chlorahexidine gluconate) soap before surgery.  CHG is an antiseptic cleaner which kills germs and bonds with the skin to continue killing germs even after washing. Please DO NOT use if you have an allergy to CHG or antibacterial soaps.  If your skin becomes reddened/irritated stop using the CHG and inform your nurse when you arrive at Short Stay. Do not shave (including legs and underarms) for at least 48 hours prior to the first CHG shower.  You may shave your face/neck.  Please follow these instructions carefully:  1.  Shower with CHG Soap the night before surgery and the  morning of surgery.  2.  If you choose to wash your hair, wash your hair first as usual with your normal  shampoo.  3.  After you shampoo, rinse your hair and body thoroughly to remove the shampoo.                             4.  Use CHG as you would any other liquid soap.  You can apply chg directly to the skin and wash.  Gently with a scrungie or clean washcloth.  5.  Apply the CHG Soap to your body ONLY FROM THE NECK DOWN.   Do   not use on  face/ open                           Wound or open sores. Avoid contact with eyes, ears mouth and   genitals (private parts).                       Wash face,  Genitals (private parts) with your normal soap.             6.  Wash thoroughly, paying special attention to the area where your    surgery  will be performed.  7.  Thoroughly rinse your body with warm water from the neck down.  8.  DO NOT shower/wash with your normal soap after using and rinsing off the CHG Soap.                9.  Pat yourself dry with a clean towel.            10.  Wear clean pajamas.            11.  Place clean sheets on your bed the night of your first shower and do not  sleep with pets. Day of  Surgery : Do not apply any lotions/deodorants the morning of surgery.  Please wear clean clothes to the hospital/surgery center.  FAILURE TO FOLLOW THESE INSTRUCTIONS MAY RESULT IN THE CANCELLATION OF YOUR SURGERY  PATIENT SIGNATURE_________________________________  NURSE SIGNATURE__________________________________  ________________________________________________________________________ WHAT IS A BLOOD TRANSFUSION? Blood Transfusion Information  A transfusion is the replacement of blood or some of its parts. Blood is made up of multiple cells which provide different functions. Red blood cells carry oxygen and are used for blood loss replacement. White blood cells fight against infection. Platelets control bleeding. Plasma helps clot blood. Other blood products are available for specialized needs, such as hemophilia or other clotting disorders. BEFORE THE TRANSFUSION  Who gives blood for transfusions?  Healthy volunteers who are fully evaluated to make sure their blood is safe. This is blood bank blood. Transfusion therapy is the safest it has ever been in the practice of medicine. Before blood is taken from a donor, a complete history is taken to make sure that person has no history of diseases nor engages in risky social behavior (examples are intravenous drug use or sexual activity with multiple partners). The donor's travel history is screened to minimize risk of transmitting infections, such as malaria. The donated blood is tested for signs of infectious diseases, such as HIV and hepatitis. The blood is then tested to be sure it is compatible with you in order to minimize the chance of a transfusion reaction. If you or a relative donates blood, this is often done in anticipation of surgery and is not appropriate for emergency situations. It takes many days to process the donated blood. RISKS AND COMPLICATIONS Although transfusion therapy is very safe and saves many lives, the main dangers  of transfusion include:  Getting an infectious disease. Developing a transfusion reaction. This is an allergic reaction to something in the blood you were given. Every precaution is taken to prevent this. The decision to have a blood transfusion has been considered carefully by your caregiver before blood is given. Blood is not given unless the benefits outweigh the risks. AFTER THE TRANSFUSION Right after receiving a blood transfusion, you will usually feel much better and more energetic. This is especially true if your red blood cells have gotten  low (anemic). The transfusion raises the level of the red blood cells which carry oxygen, and this usually causes an energy increase. The nurse administering the transfusion will monitor you carefully for complications. HOME CARE INSTRUCTIONS  No special instructions are needed after a transfusion. You may find your energy is better. Speak with your caregiver about any limitations on activity for underlying diseases you may have. SEEK MEDICAL CARE IF:  Your condition is not improving after your transfusion. You develop redness or irritation at the intravenous (IV) site. SEEK IMMEDIATE MEDICAL CARE IF:  Any of the following symptoms occur over the next 12 hours: Shaking chills. You have a temperature by mouth above 102 F (38.9 C), not controlled by medicine. Chest, back, or muscle pain. People around you feel you are not acting correctly or are confused. Shortness of breath or difficulty breathing. Dizziness and fainting. You get a rash or develop hives. You have a decrease in urine output. Your urine turns a dark color or changes to pink, red, or brown. Any of the following symptoms occur over the next 10 days: You have a temperature by mouth above 102 F (38.9 C), not controlled by medicine. Shortness of breath. Weakness after normal activity. The white part of the eye turns yellow (jaundice). You have a decrease in the amount of urine or  are urinating less often. Your urine turns a dark color or changes to pink, red, or brown. Document Released: 12/16/1999 Document Revised: 03/12/2011 Document Reviewed: 08/04/2007 [facility] Patient Information 2014 [place], [place].  _______________________________________________________________________

## 2022-05-31 ENCOUNTER — Encounter (HOSPITAL_COMMUNITY): Payer: Self-pay

## 2022-05-31 ENCOUNTER — Encounter (HOSPITAL_COMMUNITY)
Admission: RE | Admit: 2022-05-31 | Discharge: 2022-05-31 | Disposition: A | Discharge status: Home / Self Care | Payer: Medicaid Other | Source: Ambulatory Visit | Attending: Gynecologic Oncology | Admitting: Gynecologic Oncology

## 2022-05-31 ENCOUNTER — Other Ambulatory Visit: Payer: Self-pay

## 2022-05-31 VITALS — BP 115/87 | HR 90 | Temp 98.5°F | Resp 14 | Ht 62.0 in | Wt 125.0 lb

## 2022-05-31 DIAGNOSIS — O24414 Gestational diabetes mellitus in pregnancy, insulin controlled: Secondary | ICD-10-CM | POA: Insufficient documentation

## 2022-05-31 DIAGNOSIS — Z1509 Genetic susceptibility to other malignant neoplasm: Secondary | ICD-10-CM

## 2022-05-31 DIAGNOSIS — R9389 Abnormal findings on diagnostic imaging of other specified body structures: Secondary | ICD-10-CM

## 2022-05-31 DIAGNOSIS — Z01812 Encounter for preprocedural laboratory examination: Secondary | ICD-10-CM | POA: Insufficient documentation

## 2022-05-31 DIAGNOSIS — Z3A00 Weeks of gestation of pregnancy not specified: Secondary | ICD-10-CM | POA: Insufficient documentation

## 2022-05-31 DIAGNOSIS — Z1501 Genetic susceptibility to malignant neoplasm of breast: Secondary | ICD-10-CM | POA: Diagnosis not present

## 2022-05-31 LAB — CBC
HCT: 42.8 % (ref 36.0–46.0)
Hemoglobin: 13.7 g/dL (ref 12.0–15.0)
MCH: 30.3 pg (ref 26.0–34.0)
MCHC: 32 g/dL (ref 30.0–36.0)
MCV: 94.7 fL (ref 80.0–100.0)
Platelets: 211 10*3/uL (ref 150–400)
RBC: 4.52 MIL/uL (ref 3.87–5.11)
RDW: 11.9 % (ref 11.5–15.5)
WBC: 4.5 10*3/uL (ref 4.0–10.5)
nRBC: 0 % (ref 0.0–0.2)

## 2022-05-31 LAB — COMPREHENSIVE METABOLIC PANEL
ALT: 34 U/L (ref 0–44)
AST: 35 U/L (ref 15–41)
Albumin: 4.2 g/dL (ref 3.5–5.0)
Alkaline Phosphatase: 54 U/L (ref 38–126)
Anion gap: 8 (ref 5–15)
BUN: 16 mg/dL (ref 6–20)
CO2: 28 mmol/L (ref 22–32)
Calcium: 9 mg/dL (ref 8.9–10.3)
Chloride: 105 mmol/L (ref 98–111)
Creatinine, Ser: 0.59 mg/dL (ref 0.44–1.00)
GFR, Estimated: >60 mL/min (ref >60)
Glucose, Bld: 96 mg/dL (ref 70–99)
Potassium: 4 mmol/L (ref 3.5–5.1)
Sodium: 141 mmol/L (ref 135–145)
Total Bilirubin: 0.5 mg/dL (ref 0.3–1.2)
Total Protein: 7.3 g/dL (ref 6.5–8.1)

## 2022-06-01 LAB — HEMOGLOBIN A1C
Hgb A1c MFr Bld: 5.8 % — ABNORMAL HIGH (ref 4.8–5.6)
Mean Plasma Glucose: 120 mg/dL

## 2022-06-06 ENCOUNTER — Telehealth: Payer: Self-pay | Admitting: Surgery

## 2022-06-06 NOTE — Telephone Encounter (Signed)
Telephone call to check on pre-operative status.  Patient compliant with pre-operative instructions.  Reinforced nothing to eat after midnight. Clear liquids until 4:15am. Patient to arrive at 5:15am. Verified that post-op medications have been sent to patient's preferred pharmacy.  No questions or concerns voiced.  Instructed to call for any needs. 

## 2022-06-07 ENCOUNTER — Ambulatory Visit (HOSPITAL_BASED_OUTPATIENT_CLINIC_OR_DEPARTMENT_OTHER): Payer: Medicaid Other | Admitting: Anesthesiology

## 2022-06-07 ENCOUNTER — Encounter (HOSPITAL_COMMUNITY)
Admission: RE | Disposition: A | Discharge status: Home / Self Care | Payer: Self-pay | Source: Home / Self Care | Attending: Gynecologic Oncology

## 2022-06-07 ENCOUNTER — Other Ambulatory Visit: Payer: Self-pay

## 2022-06-07 ENCOUNTER — Encounter (HOSPITAL_COMMUNITY): Payer: Self-pay | Admitting: Gynecologic Oncology

## 2022-06-07 ENCOUNTER — Ambulatory Visit (HOSPITAL_COMMUNITY): Payer: Medicaid Other | Admitting: Anesthesiology

## 2022-06-07 ENCOUNTER — Ambulatory Visit (HOSPITAL_COMMUNITY)
Admission: RE | Admit: 2022-06-07 | Discharge: 2022-06-07 | Disposition: A | Discharge status: Home / Self Care | Payer: Medicaid Other | Attending: Gynecologic Oncology | Admitting: Gynecologic Oncology

## 2022-06-07 DIAGNOSIS — Z4002 Encounter for prophylactic removal of ovary: Secondary | ICD-10-CM | POA: Insufficient documentation

## 2022-06-07 DIAGNOSIS — Z9013 Acquired absence of bilateral breasts and nipples: Secondary | ICD-10-CM | POA: Insufficient documentation

## 2022-06-07 DIAGNOSIS — Z87891 Personal history of nicotine dependence: Secondary | ICD-10-CM | POA: Diagnosis not present

## 2022-06-07 DIAGNOSIS — Z148 Genetic carrier of other disease: Secondary | ICD-10-CM | POA: Diagnosis not present

## 2022-06-07 DIAGNOSIS — Z1502 Genetic susceptibility to malignant neoplasm of ovary: Secondary | ICD-10-CM | POA: Insufficient documentation

## 2022-06-07 DIAGNOSIS — D261 Other benign neoplasm of corpus uteri: Secondary | ICD-10-CM

## 2022-06-07 DIAGNOSIS — Z79810 Long term (current) use of selective estrogen receptor modulators (SERMs): Secondary | ICD-10-CM | POA: Diagnosis not present

## 2022-06-07 DIAGNOSIS — D0511 Intraductal carcinoma in situ of right breast: Secondary | ICD-10-CM | POA: Insufficient documentation

## 2022-06-07 DIAGNOSIS — E119 Type 2 diabetes mellitus without complications: Secondary | ICD-10-CM | POA: Diagnosis not present

## 2022-06-07 DIAGNOSIS — R9389 Abnormal findings on diagnostic imaging of other specified body structures: Secondary | ICD-10-CM

## 2022-06-07 DIAGNOSIS — Z1501 Genetic susceptibility to malignant neoplasm of breast: Secondary | ICD-10-CM

## 2022-06-07 DIAGNOSIS — Z1509 Genetic susceptibility to other malignant neoplasm: Secondary | ICD-10-CM | POA: Diagnosis not present

## 2022-06-07 HISTORY — PX: ROBOTIC ASSISTED TOTAL HYSTERECTOMY WITH BILATERAL SALPINGO OOPHERECTOMY: SHX6086

## 2022-06-07 LAB — TYPE AND SCREEN
ABO/RH(D): AB POS
Antibody Screen: NEGATIVE

## 2022-06-07 LAB — POCT PREGNANCY, URINE: Preg Test, Ur: NEGATIVE

## 2022-06-07 LAB — ABO/RH: ABO/RH(D): AB POS

## 2022-06-07 SURGERY — HYSTERECTOMY, TOTAL, ROBOT-ASSISTED, LAPAROSCOPIC, WITH BILATERAL SALPINGO-OOPHORECTOMY
Anesthesia: General | Site: Abdomen | Laterality: Bilateral

## 2022-06-07 MED ORDER — FENTANYL CITRATE PF 50 MCG/ML IJ SOSY
25.0000 ug | PREFILLED_SYRINGE | INTRAMUSCULAR | Status: DC | PRN
  Administered 2022-06-07: 25 ug via INTRAVENOUS

## 2022-06-07 MED ORDER — PROPOFOL 10 MG/ML IV BOLUS
INTRAVENOUS | Status: DC | PRN
  Administered 2022-06-07: 100 mg via INTRAVENOUS

## 2022-06-07 MED ORDER — CHLORHEXIDINE GLUCONATE 0.12 % MT SOLN
15.0000 mL | Freq: Once | OROMUCOSAL | Status: AC
  Administered 2022-06-07: 15 mL via OROMUCOSAL

## 2022-06-07 MED ORDER — FENTANYL CITRATE (PF) 100 MCG/2ML IJ SOLN
INTRAMUSCULAR | Status: DC | PRN
  Administered 2022-06-07: 50 ug via INTRAVENOUS

## 2022-06-07 MED ORDER — LIDOCAINE HCL (PF) 2 % IJ SOLN
INTRAMUSCULAR | Status: DC | PRN
  Administered 2022-06-07: 1.5 mg/kg/h via INTRADERMAL

## 2022-06-07 MED ORDER — GABAPENTIN 300 MG PO CAPS
300.0000 mg | ORAL_CAPSULE | ORAL | Status: AC
  Administered 2022-06-07: 300 mg via ORAL
  Filled 2022-06-07: qty 1

## 2022-06-07 MED ORDER — ONDANSETRON HCL 4 MG/2ML IJ SOLN
INTRAMUSCULAR | Status: DC | PRN
  Administered 2022-06-07: 4 mg via INTRAVENOUS

## 2022-06-07 MED ORDER — OXYCODONE HCL 5 MG PO TABS
5.0000 mg | ORAL_TABLET | Freq: Once | ORAL | Status: AC | PRN
  Administered 2022-06-07: 5 mg via ORAL

## 2022-06-07 MED ORDER — BUPIVACAINE HCL 0.25 % IJ SOLN
INTRAMUSCULAR | Status: DC | PRN
  Administered 2022-06-07: 27 mL

## 2022-06-07 MED ORDER — MIDAZOLAM HCL 2 MG/2ML IJ SOLN
INTRAMUSCULAR | Status: AC
  Filled 2022-06-07: qty 2

## 2022-06-07 MED ORDER — ROCURONIUM BROMIDE 10 MG/ML (PF) SYRINGE
PREFILLED_SYRINGE | INTRAVENOUS | Status: AC
  Filled 2022-06-07: qty 10

## 2022-06-07 MED ORDER — ORAL CARE MOUTH RINSE: 15.0000 mL | Freq: Once | OROMUCOSAL | Status: AC

## 2022-06-07 MED ORDER — ONDANSETRON HCL 4 MG PO TABS
4.0000 mg | ORAL_TABLET | Freq: Four times a day (QID) | ORAL | Status: DC | PRN
Start: 2022-06-07 — End: 2022-06-07

## 2022-06-07 MED ORDER — MIDAZOLAM HCL 5 MG/5ML IJ SOLN
INTRAMUSCULAR | Status: DC | PRN
  Administered 2022-06-07: 2 mg via INTRAVENOUS

## 2022-06-07 MED ORDER — OXYCODONE HCL 5 MG/5ML PO SOLN: 5.0000 mg | Freq: Once | ORAL | Status: AC | PRN

## 2022-06-07 MED ORDER — ROCURONIUM BROMIDE 100 MG/10ML IV SOLN
INTRAVENOUS | Status: DC | PRN
  Administered 2022-06-07: 20 mg via INTRAVENOUS
  Administered 2022-06-07: 40 mg via INTRAVENOUS

## 2022-06-07 MED ORDER — HEMOSTATIC AGENTS (NO CHARGE) OPTIME
TOPICAL | Status: DC | PRN
  Administered 2022-06-07: 1 via TOPICAL

## 2022-06-07 MED ORDER — HEPARIN SODIUM (PORCINE) 5000 UNIT/ML IJ SOLN
5000.0000 [IU] | INTRAMUSCULAR | Status: AC
  Administered 2022-06-07: 5000 [IU] via SUBCUTANEOUS
  Filled 2022-06-07: qty 1

## 2022-06-07 MED ORDER — FENTANYL CITRATE PF 50 MCG/ML IJ SOSY
PREFILLED_SYRINGE | INTRAMUSCULAR | Status: AC
  Administered 2022-06-07: 25 ug via INTRAVENOUS
  Filled 2022-06-07: qty 1

## 2022-06-07 MED ORDER — ACETAMINOPHEN 500 MG PO TABS: 1000.0000 mg | ORAL_TABLET | Freq: Once | ORAL | Status: DC | PRN

## 2022-06-07 MED ORDER — CEFAZOLIN SODIUM-DEXTROSE 2-4 GM/100ML-% IV SOLN
2.0000 g | INTRAVENOUS | Status: AC
  Administered 2022-06-07: 2 g via INTRAVENOUS
  Filled 2022-06-07: qty 100

## 2022-06-07 MED ORDER — LACTATED RINGERS IV SOLN: INTRAVENOUS | Status: DC | PRN

## 2022-06-07 MED ORDER — FENTANYL CITRATE (PF) 100 MCG/2ML IJ SOLN
INTRAMUSCULAR | Status: AC
  Filled 2022-06-07: qty 2

## 2022-06-07 MED ORDER — TRAMADOL HCL 50 MG PO TABS: 50.0000 mg | ORAL_TABLET | Freq: Four times a day (QID) | ORAL | Status: DC | PRN

## 2022-06-07 MED ORDER — KETAMINE HCL 10 MG/ML IJ SOLN
INTRAMUSCULAR | Status: DC | PRN
  Administered 2022-06-07: 30 mg via INTRAVENOUS

## 2022-06-07 MED ORDER — LIDOCAINE HCL (CARDIAC) PF 100 MG/5ML IV SOSY
PREFILLED_SYRINGE | INTRAVENOUS | Status: DC | PRN
  Administered 2022-06-07: 60 mg via INTRAVENOUS

## 2022-06-07 MED ORDER — ACETAMINOPHEN 10 MG/ML IV SOLN: 1000.0000 mg | Freq: Once | INTRAVENOUS | Status: DC | PRN

## 2022-06-07 MED ORDER — KETAMINE HCL 50 MG/5ML IJ SOSY
PREFILLED_SYRINGE | INTRAMUSCULAR | Status: AC
  Filled 2022-06-07: qty 5

## 2022-06-07 MED ORDER — OXYCODONE HCL 5 MG PO TABS: 5.0000 mg | ORAL_TABLET | ORAL | Status: DC | PRN

## 2022-06-07 MED ORDER — ONDANSETRON HCL 4 MG/2ML IJ SOLN
INTRAMUSCULAR | Status: AC
  Filled 2022-06-07: qty 2

## 2022-06-07 MED ORDER — LIDOCAINE HCL (PF) 2 % IJ SOLN
INTRAMUSCULAR | Status: AC
  Filled 2022-06-07: qty 10

## 2022-06-07 MED ORDER — LACTATED RINGERS IV SOLN: INTRAVENOUS | Status: DC

## 2022-06-07 MED ORDER — ALBUMIN HUMAN 5 % IV SOLN
INTRAVENOUS | Status: AC
  Filled 2022-06-07: qty 250

## 2022-06-07 MED ORDER — DEXAMETHASONE SODIUM PHOSPHATE 10 MG/ML IJ SOLN
INTRAMUSCULAR | Status: AC
  Filled 2022-06-07: qty 1

## 2022-06-07 MED ORDER — ACETAMINOPHEN 500 MG PO TABS
1000.0000 mg | ORAL_TABLET | ORAL | Status: AC
  Administered 2022-06-07: 1000 mg via ORAL
  Filled 2022-06-07: qty 2

## 2022-06-07 MED ORDER — PHENYLEPHRINE 80 MCG/ML (10ML) SYRINGE FOR IV PUSH (FOR BLOOD PRESSURE SUPPORT)
PREFILLED_SYRINGE | INTRAVENOUS | Status: AC
  Filled 2022-06-07: qty 10

## 2022-06-07 MED ORDER — SUGAMMADEX SODIUM 200 MG/2ML IV SOLN
INTRAVENOUS | Status: DC | PRN
  Administered 2022-06-07: 200 mg via INTRAVENOUS

## 2022-06-07 MED ORDER — SCOPOLAMINE 1 MG/3DAYS TD PT72
1.0000 | MEDICATED_PATCH | TRANSDERMAL | Status: DC
  Administered 2022-06-07: 1.5 mg via TRANSDERMAL
  Filled 2022-06-07: qty 1

## 2022-06-07 MED ORDER — PROPOFOL 10 MG/ML IV BOLUS
INTRAVENOUS | Status: AC
  Filled 2022-06-07: qty 20

## 2022-06-07 MED ORDER — KETOROLAC TROMETHAMINE 15 MG/ML IJ SOLN
15.0000 mg | INTRAMUSCULAR | Status: AC
  Administered 2022-06-07: 15 mg via INTRAVENOUS
  Filled 2022-06-07: qty 1

## 2022-06-07 MED ORDER — BUPIVACAINE HCL 0.25 % IJ SOLN
INTRAMUSCULAR | Status: AC
  Filled 2022-06-07: qty 1

## 2022-06-07 MED ORDER — ONDANSETRON HCL 4 MG/2ML IJ SOLN
4.0000 mg | Freq: Four times a day (QID) | INTRAMUSCULAR | Status: DC | PRN
Start: 2022-06-07 — End: 2022-06-07
  Administered 2022-06-07: 4 mg via INTRAVENOUS

## 2022-06-07 MED ORDER — OXYCODONE HCL 5 MG PO TABS
ORAL_TABLET | ORAL | Status: AC
  Filled 2022-06-07: qty 1

## 2022-06-07 MED ORDER — ACETAMINOPHEN 160 MG/5ML PO SOLN: 1000.0000 mg | Freq: Once | ORAL | Status: DC | PRN

## 2022-06-07 MED ORDER — STERILE WATER FOR IRRIGATION IR SOLN
Status: DC | PRN
  Administered 2022-06-07: 1000 mL

## 2022-06-07 MED ORDER — LACTATED RINGERS IR SOLN
Status: DC | PRN
  Administered 2022-06-07: 1000 mL

## 2022-06-07 MED ORDER — DEXAMETHASONE SODIUM PHOSPHATE 4 MG/ML IJ SOLN
4.0000 mg | INTRAMUSCULAR | Status: AC
  Administered 2022-06-07: 5 mg via INTRAVENOUS

## 2022-06-07 SURGICAL SUPPLY — 81 items
ADH SKN CLS APL DERMABOND .7 (GAUZE/BANDAGES/DRESSINGS) ×1
AGENT HMST KT MTR STRL THRMB (HEMOSTASIS)
APL ESCP 34 STRL LF DISP (HEMOSTASIS)
APPLICATOR SURGIFLO ENDO (HEMOSTASIS) IMPLANT
BAG COUNTER SPONGE SURGICOUNT (BAG) IMPLANT
BAG LAPAROSCOPIC 12 15 PORT 16 (BASKET) IMPLANT
BAG RETRIEVAL 12/15 (BASKET)
BAG SPNG CNTER NS LX DISP (BAG)
BLADE SURG SZ10 CARB STEEL (BLADE) IMPLANT
COVER BACK TABLE 60X90IN (DRAPES) ×1 IMPLANT
COVER TIP SHEARS 8 DVNC (MISCELLANEOUS) ×1 IMPLANT
DERMABOND ADVANCED .7 DNX12 (GAUZE/BANDAGES/DRESSINGS) ×1 IMPLANT
DRAPE ARM DVNC X/XI (DISPOSABLE) ×4 IMPLANT
DRAPE COLUMN DVNC XI (DISPOSABLE) ×1 IMPLANT
DRAPE SHEET LG 3/4 BI-LAMINATE (DRAPES) ×1 IMPLANT
DRAPE SURG IRRIG POUCH 19X23 (DRAPES) ×1 IMPLANT
DRIVER NDL MEGA SUTCUT DVNCXI (INSTRUMENTS) ×1 IMPLANT
DRIVER NDLE MEGA SUTCUT DVNCXI (INSTRUMENTS) ×1 IMPLANT
DRSG OPSITE POSTOP 4X6 (GAUZE/BANDAGES/DRESSINGS) IMPLANT
DRSG OPSITE POSTOP 4X8 (GAUZE/BANDAGES/DRESSINGS) IMPLANT
ELECT PENCIL ROCKER SW 15FT (MISCELLANEOUS) IMPLANT
ELECT REM PT RETURN 15FT ADLT (MISCELLANEOUS) ×1 IMPLANT
FORCEPS BPLR FENES DVNC XI (FORCEP) ×1 IMPLANT
FORCEPS PROGRASP DVNC XI (FORCEP) ×1 IMPLANT
GAUZE 4X4 16PLY ~~LOC~~+RFID DBL (SPONGE) ×2 IMPLANT
GLOVE BIO SURGEON STRL SZ 6 (GLOVE) ×4 IMPLANT
GLOVE BIO SURGEON STRL SZ 6.5 (GLOVE) ×1 IMPLANT
GOWN STRL REUS W/ TWL LRG LVL3 (GOWN DISPOSABLE) ×4 IMPLANT
GOWN STRL REUS W/TWL LRG LVL3 (GOWN DISPOSABLE) ×4
GRASPER SUT TROCAR 14GX15 (MISCELLANEOUS) IMPLANT
HEMOSTAT SURGICEL 2X3 (HEMOSTASIS) IMPLANT
HOLDER FOLEY CATH W/STRAP (MISCELLANEOUS) IMPLANT
IRRIG SUCT STRYKERFLOW 2 WTIP (MISCELLANEOUS) ×1
IRRIGATION SUCT STRKRFLW 2 WTP (MISCELLANEOUS) ×1 IMPLANT
KIT PROCEDURE DVNC SI (MISCELLANEOUS) IMPLANT
KIT TURNOVER KIT A (KITS) IMPLANT
LIGASURE IMPACT 36 18CM CVD LR (INSTRUMENTS) IMPLANT
MANIPULATOR ADVINCU DEL 3.0 PL (MISCELLANEOUS) IMPLANT
MANIPULATOR ADVINCU DEL 3.5 PL (MISCELLANEOUS) IMPLANT
MANIPULATOR UTERINE 4.5 ZUMI (MISCELLANEOUS) IMPLANT
NDL HYPO 21X1.5 SAFETY (NEEDLE) ×1 IMPLANT
NDL SPNL 18GX3.5 QUINCKE PK (NEEDLE) IMPLANT
NEEDLE HYPO 21X1.5 SAFETY (NEEDLE) ×1 IMPLANT
NEEDLE SPNL 18GX3.5 QUINCKE PK (NEEDLE) IMPLANT
OBTURATOR OPTICAL STND 8 DVNC (TROCAR) ×1
OBTURATOR OPTICALSTD 8 DVNC (TROCAR) ×1 IMPLANT
PACK ROBOT GYN CUSTOM WL (TRAY / TRAY PROCEDURE) ×1 IMPLANT
PAD POSITIONING PINK XL (MISCELLANEOUS) ×1 IMPLANT
PORT ACCESS TROCAR AIRSEAL 12 (TROCAR) IMPLANT
SCISSORS MNPLR CVD DVNC XI (INSTRUMENTS) ×1 IMPLANT
SCRUB CHG 4% DYNA-HEX 4OZ (MISCELLANEOUS) IMPLANT
SEAL UNIV 5-12 XI (MISCELLANEOUS) ×4 IMPLANT
SET TRI-LUMEN FLTR TB AIRSEAL (TUBING) ×1 IMPLANT
SPIKE FLUID TRANSFER (MISCELLANEOUS) ×1 IMPLANT
SPONGE T-LAP 18X18 ~~LOC~~+RFID (SPONGE) IMPLANT
SURGIFLO W/THROMBIN 8M KIT (HEMOSTASIS) IMPLANT
SUT MNCRL AB 4-0 PS2 18 (SUTURE) IMPLANT
SUT PDS AB 1 TP1 96 (SUTURE) IMPLANT
SUT V-LOC 180 0-0 GS22 (SUTURE) IMPLANT
SUT VIC AB 0 CT1 27 (SUTURE)
SUT VIC AB 0 CT1 27XBRD ANTBC (SUTURE) IMPLANT
SUT VIC AB 2-0 CT1 27 (SUTURE)
SUT VIC AB 2-0 CT1 TAPERPNT 27 (SUTURE) IMPLANT
SUT VIC AB 2-0 SH 27 (SUTURE) ×1
SUT VIC AB 2-0 SH 27X BRD (SUTURE) IMPLANT
SUT VIC AB 4-0 PS2 18 (SUTURE) ×2 IMPLANT
SUT VICRYL 0 27 CT2 27 ABS (SUTURE) ×1 IMPLANT
SUT VLOC 180 0 9IN GS21 (SUTURE) IMPLANT
SYR 10ML LL (SYRINGE) IMPLANT
SYS BAG RETRIEVAL 10MM (BASKET)
SYS WOUND ALEXIS 18CM MED (MISCELLANEOUS)
SYSTEM BAG RETRIEVAL 10MM (BASKET) IMPLANT
SYSTEM WOUND ALEXIS 18CM MED (MISCELLANEOUS) IMPLANT
TOWEL OR NON WOVEN STRL DISP B (DISPOSABLE) IMPLANT
TRAP SPECIMEN MUCUS 40CC (MISCELLANEOUS) IMPLANT
TRAY FOLEY MTR SLVR 16FR STAT (SET/KITS/TRAYS/PACK) ×1 IMPLANT
TROCAR PORT AIRSEAL 5X120 (TROCAR) IMPLANT
UNDERPAD 30X36 HEAVY ABSORB (UNDERPADS AND DIAPERS) ×2 IMPLANT
WATER STERILE IRR 1000ML POUR (IV SOLUTION) ×1 IMPLANT
YANKAUER SUCT BULB TIP 10FT TU (MISCELLANEOUS) IMPLANT
YANKAUER SUCT BULB TIP NO VENT (SUCTIONS) IMPLANT

## 2022-06-07 NOTE — Anesthesia Preprocedure Evaluation (Addendum)
Anesthesia Evaluation  Patient identified by MRN, date of birth, ID band Patient awake    Reviewed: Allergy & Precautions, NPO status , Patient's Chart, lab work & pertinent test results  History of Anesthesia Complications Negative for: history of anesthetic complications  Airway Mallampati: III  TM Distance: >3 FB Neck ROM: Full    Dental  (+) Teeth Intact, Dental Advisory Given   Pulmonary neg shortness of breath, neg sleep apnea, neg COPD, neg recent URI   breath sounds clear to auscultation       Cardiovascular negative cardio ROS  Rhythm:Regular     Neuro/Psych negative neurological ROS  negative psych ROS   GI/Hepatic   Endo/Other  diabetes  Lab Results      Component                Value               Date                      HGBA1C                   5.8 (H)             05/31/2022             Renal/GU Lab Results      Component                Value               Date                      CREATININE               0.59                05/31/2022                Musculoskeletal negative musculoskeletal ROS (+)    Abdominal   Peds  Hematology negative hematology ROS (+) Lab Results      Component                Value               Date                      WBC                      4.5                 05/31/2022                HGB                      13.7                05/31/2022                HCT                      42.8                05/31/2022                MCV                      94.7  05/31/2022                PLT                      211                 05/31/2022              Anesthesia Other Findings   Reproductive/Obstetrics  BRCA 1 GENE MUTATION POSITIVE      THICKENED ENDOMETRIUM                               Anesthesia Physical Anesthesia Plan  ASA: 2  Anesthesia Plan: General   Post-op Pain Management: Tylenol PO (pre-op)*, Toradol IV  (intra-op)* and Gabapentin PO (pre-op)*   Induction: Intravenous  PONV Risk Score and Plan: 3 and Ondansetron, Dexamethasone and Propofol infusion  Airway Management Planned: Oral ETT  Additional Equipment: None  Intra-op Plan:   Post-operative Plan: Extubation in OR  Informed Consent: I have reviewed the patients History and Physical, chart, labs and discussed the procedure including the risks, benefits and alternatives for the proposed anesthesia with the patient or authorized representative who has indicated his/her understanding and acceptance.     Dental advisory given  Plan Discussed with: CRNA  Anesthesia Plan Comments:        Anesthesia Quick Evaluation

## 2022-06-07 NOTE — Transfer of Care (Signed)
Immediate Anesthesia Transfer of Care Note  Patient: Toni Crawford  Procedure(s) Performed: XI ROBOTIC ASSISTED TOTAL HYSTERECTOMY WITH BILATERAL SALPINGO OOPHORECTOMY (Bilateral: Abdomen)  Patient Location: PACU  Anesthesia Type:General  Level of Consciousness: awake, oriented, drowsy, and patient cooperative  Airway & Oxygen Therapy: Patient Spontanous Breathing and Patient connected to face mask oxygen  Post-op Assessment: Report given to RN and Post -op Vital signs reviewed and stable  Post vital signs: Reviewed and stable  Last Vitals:  Vitals Value Taken Time  BP 107/78 06/07/22 1004  Temp    Pulse 65 06/07/22 1009  Resp 12 06/07/22 1009  SpO2 100 % 06/07/22 1009  Vitals shown include unvalidated device data.  Last Pain:  Vitals:   06/07/22 0559  TempSrc: Oral  PainSc:          Complications: No notable events documented.

## 2022-06-07 NOTE — Anesthesia Procedure Notes (Signed)
Procedure Name: Intubation Date/Time: 06/07/2022 7:44 AM  Performed by: Johnette Abraham, CRNAPre-anesthesia Checklist: Patient identified, Emergency Drugs available, Suction available and Patient being monitored Patient Re-evaluated:Patient Re-evaluated prior to induction Oxygen Delivery Method: Circle System Utilized Preoxygenation: Pre-oxygenation with 100% oxygen Induction Type: IV induction Ventilation: Mask ventilation without difficulty Laryngoscope Size: Mac and 3 Grade View: Grade I Tube type: Oral Tube size: 6.5 mm Number of attempts: 1 Airway Equipment and Method: Stylet and Oral airway Placement Confirmation: ETT inserted through vocal cords under direct vision, positive ETCO2 and breath sounds checked- equal and bilateral Secured at: 21 cm Tube secured with: Tape Dental Injury: Teeth and Oropharynx as per pre-operative assessment

## 2022-06-07 NOTE — Op Note (Signed)
OPERATIVE NOTE  Pre-operative Diagnosis: BRCA1 mutation, thickened endometrium  Post-operative Diagnosis: same, benign endometrium on frozen  Operation: Robotic-assisted laparoscopic total hysterectomy with bilateral salpingo-oophorectomy  Surgeon: Eugene Garnet MD  Assistant Surgeon: Warner Mccreedy, NP (an NP assistant was necessary for tissue manipulation, management of robotic instrumentation, retraction and positioning due to the complexity of the case and hospital policies).   Anesthesia: GET  Urine Output: 500 cc  Operative Findings: On EUA, small mobile uterus. No adnexal masses. On intra-abdominal entry, normal upper abdominal survey. Minimal adhesions in right mid abdomen in the setting of her appendectomy history. Normal omentum, small and large bowel. Uterus 8 cm and normal in appearance. Normal appearing bilateral adnexa. No ascites.   Estimated Blood Loss:  100 cc      Total IV Fluids: see I&O flowsheet         Specimens: uterus, cervix, bilateral tubes and ovaries, pelvic washings         Complications:  None apparent; patient tolerated the procedure well.         Disposition: PACU - hemodynamically stable.  Procedure Details  The patient was seen in the Holding Room. The risks, benefits, complications, treatment options, and expected outcomes were discussed with the patient.  The patient concurred with the proposed plan, giving informed consent.  The site of surgery properly noted/marked. The patient was identified as Toni Crawford and the procedure verified as a Robotic-assisted hysterectomy with bilateral salpingo oophorectomy.   After induction of anesthesia, the patient was draped and prepped in the usual sterile manner. Patient was placed in supine position after anesthesia and draped and prepped in the usual sterile manner as follows: Her arms were tucked to her side with all appropriate precautions.  The patient was secured to the bed using padding and tape across  her chest.  The patient was placed in the semi-lithotomy position in [place] stirrups.  The perineum and vagina were prepped with CHG. The patient's abdomen was prepped with ChloraPrep and then she was draped after the prep had been allowed to dry for 3 minutes.  A Time Out was held and the above information confirmed.  The urethra was prepped with Betadine. Foley catheter was placed.  A sterile speculum was placed in the vagina.  The cervix was grasped with a single-tooth tenaculum. The cervix was dilated with Shawnie Pons dilators.  The ZUMI uterine manipulator with a medium colpotomizer ring was placed without difficulty.  A pneum occluder balloon was placed over the manipulator.  OG tube placement was confirmed and to suction.   Next, a 5 mm skin incision was made 1 cm below the subcostal margin in the midclavicular line.  The 5 mm Optiview port and scope was used for direct entry.  Opening pressure was under 10 mm CO2.  The abdomen was insufflated and the findings were noted as above.   At this point and all points during the procedure, the patient's intra-abdominal pressure did not exceed 15 mmHg. Next, an 8 mm skin incision was made superior to the umbilicus and a right and left port were placed about 8 cm lateral to the robot port on the right and left side.  The 5 mm assist trocar was exchanged for a 5 mm airseal port. All ports were placed under direct visualization.  The patient was placed in steep Trendelenburg.  The robot was docked in the normal manner.  The right and left peritoneum were opened parallel to the IP ligament to open the retroperitoneal spaces  bilaterally. The round ligaments were transected. The ureter was noted to be on the medial leaf of the broad ligament.  The peritoneum above the ureter was incised and stretched and the infundibulopelvic ligament was skeletonized, cauterized and cut.    The posterior peritoneum was taken down to the level of the KOH ring.  The anterior peritoneum was  also taken down.  The bladder flap was created to the level of the KOH ring.  The uterine artery on the right side was skeletonized, cauterized and cut in the normal manner.  A similar procedure was performed on the left.  The colpotomy was made and the uterus, cervix, bilateral ovaries and tubes were amputated and delivered through the vagina.  Pedicles were inspected and excellent hemostasis was achieved.    The colpotomy at the vaginal cuff was closed with 0 Vicryl with a figure of eight stitch at each apex and 0 V-Loc to close the midportion of the cuff in a running manner.  Irrigation was used and excellent hemostasis was achieved.  Intra-abdominal pressure was decreased to 5 mm Hg with excellent hemostasis noted. At this point in the procedure was completed.  Robotic instruments were removed under direct visulaization.  The robot was undocked.  The subcutaneus tissue at the two lateral incisions was bleeding necessitating increasing the size of each incision 5-10 mm to achieve hemostasis. The subcuticular tissue was closed with 4-0 Vicryl and the skin was closed with 4-0 Monocryl using mattress stitches.  Dermabond was applied.    The vagina was swabbed with some bleeding noted from the right apex. A figure of eight was placed near the right apex over the small area of oozing along the cuff with good hemostasis noted after. Foley catheter was removed.  All sponge, lap and needle counts were correct x  3.   The patient was transferred to the recovery room in stable condition.  Eugene Garnet, MD

## 2022-06-07 NOTE — Anesthesia Postprocedure Evaluation (Signed)
Anesthesia Post Note  Patient: Toni Crawford  Procedure(s) Performed: XI ROBOTIC ASSISTED TOTAL HYSTERECTOMY WITH BILATERAL SALPINGO OOPHORECTOMY (Bilateral: Abdomen)     Patient location during evaluation: PACU Anesthesia Type: General Level of consciousness: awake and alert Pain management: pain level controlled Vital Signs Assessment: post-procedure vital signs reviewed and stable Respiratory status: spontaneous breathing, nonlabored ventilation and respiratory function stable Cardiovascular status: blood pressure returned to baseline and stable Postop Assessment: no apparent nausea or vomiting Anesthetic complications: no   No notable events documented.  Last Vitals:  Vitals:   06/07/22 1136 06/07/22 1206  BP: 132/87 127/77  Pulse: (!) 58 62  Resp: 14   Temp: 36.8 C   SpO2: 96% 97%    Last Pain:  Vitals:   06/07/22 1206  TempSrc:   PainSc: 0-No pain                 Lavar Rosenzweig

## 2022-06-07 NOTE — Discharge Instructions (Addendum)
AFTER SURGERY INSTRUCTIONS   Return to work: 4-6 weeks if applicable  Plan to stop taking your tamoxifen one week before surgery and restart one week after.    Activity: 1. Be up and out of the bed during the day.  Take a nap if needed.  You may walk up steps but be careful and use the hand rail.  Stair climbing will tire you more than you think, you may need to stop part way and rest.    2. No lifting or straining for 6 weeks over 10 pounds. No pushing, pulling, straining for 6 weeks.   3. No driving for around 1 week(s).  Do not drive if you are taking narcotic pain medicine and make sure that your reaction time has returned.    4. You can shower as soon as the next day after surgery. Shower daily.  Use your regular soap and water (not directly on the incision) and pat your incision(s) dry afterwards; don't rub.  No tub baths or submerging your body in water until cleared by your surgeon. If you have the soap that was given to you by pre-surgical testing that was used before surgery, you do not need to use it afterwards because this can irritate your incisions.    5. No sexual activity and nothing in the vagina for 10-12 weeks.   6. You may experience a small amount of clear drainage from your incisions, which is normal.  If the drainage persists, increases, or changes color please call the office.   7. Do not use creams, lotions, or ointments such as neosporin on your incisions after surgery until advised by your surgeon because they can cause removal of the dermabond glue on your incisions.     8. You may experience vaginal spotting after surgery or around the 6-8 week mark from surgery when the stitches at the top of the vagina begin to dissolve.  The spotting is normal but if you experience heavy bleeding, call our office.   9. Take Tylenol or ibuprofen first for pain if you are able to take these medications and only use narcotic pain medication for severe pain not relieved by the  Tylenol or Ibuprofen.  Monitor your Tylenol intake to a max of 4,000 mg in a 24 hour period. You can alternate these medications after surgery.   Diet: 1. Low sodium Heart Healthy Diet is recommended but you are cleared to resume your normal (before surgery) diet after your procedure.   2. It is safe to use a laxative, such as Miralax or Colace, if you have difficulty moving your bowels. You have been prescribed Sennakot-S to take at bedtime every evening after surgery to keep bowel movements regular and to prevent constipation.     Wound Care: 1. Keep clean and dry.  Shower daily.   Reasons to call the Doctor: Fever - Oral temperature greater than 100.4 degrees Fahrenheit Foul-smelling vaginal discharge Difficulty urinating Nausea and vomiting Increased pain at the site of the incision that is unrelieved with pain medicine. Difficulty breathing with or without chest pain New calf pain especially if only on one side Sudden, continuing increased vaginal bleeding with or without clots.   Contacts: For questions or concerns you should contact:   Dr. Eugene Garnet at [phone]   Warner Mccreedy, NP at [phone]   After Hours: call [phone] and have the GYN Oncologist paged/contacted (after 5 pm or on the weekends). You will speak with an after hours RN and let  he or she know you have had surgery.   Messages sent via mychart are for non-urgent matters and are not responded to after hours so for urgent needs, please call the after hours number.

## 2022-06-07 NOTE — H&P (Signed)
Gynecologic Oncology H&P  Treatment History: Oncology History  Ductal carcinoma in situ (DCIS) of right breast  04/11/2021 Initial Diagnosis   Screening mammogram detected right breast 2.7 cm indeterminate mass with calcifications, biopsy revealed low-grade DCIS with a focus of intermediate grade DCIS with necrosis involving intraductal papilloma, ER 100%, PR 15%   05/25/2021 Genetic Testing   GENETIC TEST RESULTS: Genetic testing reported through the CancerNext-Expanded+RNAinsight cancer panel found no additional pathogenic mutations, other than the original BRCA1 p.C1697R (c.5089T>C) Likely Pathogenic variant. The CancerNext-Expanded gene panel offered by [facility] and includes sequencing and rearrangement analysis for the following 77 genes: AIP, ALK, APC*, ATM*, AXIN2, BAP1, BARD1, BLM, BMPR1A, BRCA1*, BRCA2*, BRIP1*, CDC73, CDH1*, CDK4, CDKN1B, CDKN2A, CHEK2*, CTNNA1, DICER1, FANCC, FH, FLCN, GALNT12, KIF1B, LZTR1, MAX, MEN1, MET, MLH1*, MSH2*, MSH3, MSH6*, MUTYH*, NBN, NF1*, NF2, NTHL1, PALB2*, PHOX2B, PMS2*, POT1, PRKAR1A, PTCH1, PTEN*, RAD51C*, RAD51D*, RB1, RECQL, RET, SDHA, SDHAF2, SDHB, SDHC, SDHD, SMAD4, SMARCA4, SMARCB1, SMARCE1, STK11, SUFU, TMEM127, TP53*, TSC1, TSC2, VHL and XRCC2 (sequencing and deletion/duplication); EGFR, EGLN1, HOXB13, KIT, MITF, PDGFRA, POLD1, and POLE (sequencing only); EPCAM and GREM1   UPDATE: AXIN2 p.C241G VUS has been reclassified as likely benign.  The amended report date is March 29, 2022.   06/22/2021 Surgery   Bilateral mastectomies Left mastectomy: Fibrocystic changes Right mastectomy: DCIS with clear margins grade 2 ER 100% PR 15%   06/2021 -  Anti-estrogen oral therapy   Tamoxifen   12/28/2021 Cancer Staging   Staging form: Breast, AJCC 8th Edition - Clinical: Stage 0 (cTis (DCIS), cN0, cM0) - Signed by Loa Socks, NP on 12/28/2021    Patient was diagnosed earlier this year with low-grade DCIS of the right breast after a mass  was found on screening mammogram.  She underwent bilateral mastectomies in June with the DCIS, grade 2, ER+.    Genetic testing performed in May of this year revealed a likely pathogenic variant in BRCA1, [id number]. Genetic testing also identified 4 variants of uncertain significance (VUS) - one in the AXIN2 gene called p.C241G, a second in the CDKN2A gene called p.V115L, a third in the [facility] gene called p.F198L and a fourth in the NTHL1 gene called p.A264T   Patient reports that she has healed well from surgery.  She is scheduled for removal of her tissue expanders with placement of bilateral breast implants next week on 9/15.   She has been on tamoxifen since June.  She is tolerating this well without significant side effects.   Pelvic ultrasound 01/2022: 1. Endometrium appears prominent at 8-9 mm for reported postmenopausal state. No focal abnormality is identified. Postmenopausal endometrial thickness is normally expected under the 5 mm range. However if there is no abnormal bleeding 8-11 mm is considered the upper limits of normal. Given history of malignancy and BRCA1 mutation, consider tissue sampling if not previously performed. 2. Previously described LEFT ovarian complicated cyst is no longer discretely visualized. There is a small benign appearing simple cyst which measures 12 mm x 4 x 11 mm (for which no dedicated imaging follow-up is recommended) . 3. Trace pelvic free fluid.   EMB on 10/31/21:  A. ENDOMETRIUM BIOPSY:  - BENIGN, INACTIVE ENDOMETRIUM.  - NEGATIVE FOR HYPERPLASIA OR MALIGNANCY.   Interval History: Doing well, no changes since last visit.  Past Medical/Surgical History: Past Medical History:  Diagnosis Date   DCIS (ductal carcinoma in situ)    Family history of colon cancer    Family history of ovarian cancer  Gestational diabetes    glyburide   Heart murmur    told with pregnancy--no symptoms   Tuberculosis    "when I was young" states she received  treatment    Past Surgical History:  Procedure Laterality Date   APPENDECTOMY     BREAST RECONSTRUCTION WITH PLACEMENT OF TISSUE EXPANDER AND ALLODERM Bilateral 06/22/2021   Procedure: BREAST RECONSTRUCTION WITH PLACEMENT OF TISSUE EXPANDER AND ALLODERM;  Surgeon: Glenna Fellows, MD;  Location: [facility];  Service: Plastics;  Laterality: Bilateral;   REMOVAL OF BILATERAL TISSUE EXPANDERS WITH PLACEMENT OF BILATERAL BREAST IMPLANTS Bilateral 09/15/2021   Procedure: REMOVAL OF BILATERAL TISSUE EXPANDERS WITH PLACEMENT OF BILATERAL BREAST IMPLANTS;  Surgeon: Glenna Fellows, MD;  Location: [facility];  Service: Plastics;  Laterality: Bilateral;   SIMPLE MASTECTOMY WITH AXILLARY SENTINEL NODE BIOPSY Bilateral 06/22/2021   Procedure: BILATERAL  MASTECTOMIES WITH IMMEDIATE RECONSTRUCTION, MAGTRACE INJECTION RIGHT BREAST;  Surgeon: Manus Rudd, MD;  Location: [facility];  Service: General;  Laterality: Bilateral;  LMA PEC BLOCK   UPPER GASTROINTESTINAL ENDOSCOPY      Family History  Problem Relation Age of Onset   Uterine cancer Mother    Diabetes Father    Kidney disease Father    Diabetes Sister    Uterine cancer Sister    Diabetes Sister    Colon cancer Maternal Uncle    Esophageal cancer Neg Hx    Rectal cancer Neg Hx    Stomach cancer Neg Hx     Social History   Socioeconomic History   Marital status: Married    Spouse name: Onalee Hua   Number of children: 4   Years of education: Not on file   Highest education level: 10th grade  Occupational History   Occupation: work as Designer, multimedia  Tobacco Use   Smoking status: Never    Passive exposure: Past   Smokeless tobacco: Former    Types: Snuff, Designer, multimedia Use   Vaping Use: Never used  Substance and Sexual Activity   Alcohol use: No   Drug use: No   Sexual activity: Yes    Birth control/protection: None  Other Topics Concern   Not on file  Social History Narrative   Not on  file   Social Determinants of Health   Financial Resource Strain: Not on file  Food Insecurity: Not on file  Transportation Needs: No Transportation Needs (03/20/2018)   PRAPARE - Administrator, Civil Service (Medical): No    Lack of Transportation (Non-Medical): No  Physical Activity: Not on file  Stress: Not on file  Social Connections: Not on file    Current Medications:  Current Facility-Administered Medications:    ceFAZolin (ANCEF) IVPB 2g/100 mL premix, 2 g, Intravenous, On Call to OR, Cross, Melissa D, NP   dexamethasone (DECADRON) injection 4 mg, 4 mg, Intravenous, On Call to OR, Cross, Melissa D, NP   lactated ringers infusion, , Intravenous, Continuous, Heather Roberts, MD   scopolamine (TRANSDERM-SCOP) 1 MG/3DAYS 1.5 mg, 1 patch, Transdermal, On Call to OR, Warner Mccreedy D, NP, 1.5 mg at 06/07/22 1610  Physical Exam: BP 111/81   Pulse 66   Temp 98.1 F (36.7 C) (Oral)   Resp 15   Ht 5\' 2"  (1.575 m)   Wt 125 lb (56.7 kg)   LMP 08/29/2021 (Approximate)   SpO2 98%   BMI 22.86 kg/m  General: Alert, oriented, no acute distress.  HEENT: Normocephalic, atraumatic. Sclera anicteric.  Chest:  Clear to auscultation bilaterally. No wheezes, rhonchi, or rales. Cardiovascular: Regular rate and rhythm, no murmurs, rubs, or gallops.  Abdomen: Laxity of the anterior abdominal wall.  Normoactive bowel sounds. Soft, nondistended, nontender to palpation. No masses or hepatosplenomegaly appreciated. No palpable fluid wave.  Well-healed right lower quadrant incision. Extremities: Grossly normal range of motion. Warm, well perfused. No edema bilaterally.   Laboratory & Radiologic Studies:    Latest Ref Rng & Units 05/31/2022    1:48 PM 05/04/2021    9:55 AM 03/27/2021    4:06 PM  CBC  WBC 4.0 - 10.5 K/uL 4.5  5.1  5.2   Hemoglobin 12.0 - 15.0 g/dL 91.4  78.2  95.6   Hematocrit 36.0 - 46.0 % 42.8  37.9  39.6   Platelets 150 - 400 K/uL 211  191  202.0       Latest Ref  Rng & Units 05/31/2022    1:48 PM 05/04/2021    9:55 AM 06/19/2013   11:51 AM  BMP  Glucose 70 - 99 mg/dL 96  213  65   BUN 6 - 20 mg/dL 16  7    Creatinine 0.86 - 1.00 mg/dL 5.78  4.69    Sodium 629 - 145 mmol/L 141  141    Potassium 3.5 - 5.1 mmol/L 4.0  3.7    Chloride 98 - 111 mmol/L 105  109    CO2 22 - 32 mmol/L 28  27    Calcium 8.9 - 10.3 mg/dL 9.0  9.1     [id number] in 04/2022: 12.3  Pelvic ultrasound 01/12/22: 1. Endometrium appears prominent at 8-9 mm for reported postmenopausal state. No focal abnormality is identified. Postmenopausal endometrial thickness is normally expected under the 5 mm range. However if there is no abnormal bleeding 8-11 mm is considered the upper limits of normal. Given history of malignancy and BRCA1 mutation, consider tissue sampling if not previously performed. 2. Previously described LEFT ovarian complicated cyst is no longer discretely visualized. There is a small benign appearing simple cyst which measures 12 mm x 4 x 11 mm (for which no dedicated imaging follow-up is recommended) . 3. Trace pelvic free fluid.  Assessment & Plan: Toni Crawford is a 47 y.o. woman with known BRCA1 mutation and recent diagnosis of DCIS.   Plan for risk reducing surgery today with TRH/BSO. See counseling from 04/06/22.  Eugene Garnet, MD  Division of Gynecologic Oncology  Department of Obstetrics and Gynecology  [place]

## 2022-06-08 ENCOUNTER — Telehealth: Payer: Self-pay | Admitting: Surgery

## 2022-06-08 ENCOUNTER — Encounter (HOSPITAL_COMMUNITY): Payer: Self-pay | Admitting: Gynecologic Oncology

## 2022-06-08 LAB — CYTOLOGY - NON PAP

## 2022-06-08 NOTE — Telephone Encounter (Signed)
Spoke with Toni Crawford and her husband this morning. She states she is eating, drinking and urinating well. She has not had a BM yet but is passing gas. She is taking senokot as prescribed and encouraged her to drink plenty of water. She denies fever or chills. Incisions are dry and intact. She rates her pain 0/10. Her pain is controlled with Tramadol.     Instructed to call office with any fever, chills, purulent drainage, uncontrolled pain or any other questions or concerns. Patient verbalizes understanding.   Pt aware of post op appointments as well as the office number [phone] and after hours number [phone] to call if she has any questions or concerns

## 2022-06-11 ENCOUNTER — Telehealth: Payer: Self-pay

## 2022-06-11 LAB — SURGICAL PATHOLOGY

## 2022-06-11 NOTE — Telephone Encounter (Addendum)
Pts spouse called and stated that she has not had a BM yet from surgery on 06/07/22.  Pts spouse stated that she is passing gas, taking senokot as prescribed, and drinking lots of fluids.  Informed pts spouse to get OTC Miralax and continue the senokot. Informed pts spouse that someone from our office will call and see how she is doing in the morning. Pts spouse verbalized understanding.

## 2022-06-12 ENCOUNTER — Telehealth: Payer: Self-pay

## 2022-06-12 NOTE — Telephone Encounter (Signed)
Called pts spouse, Onalee Hua and spoke with him in regards to pt having a BM.  Pt had a BM yesterday.  Pt is still taking senekot and drinking lots of water. Pts spouse told to call with any other concerns or questions.

## 2022-06-30 NOTE — Progress Notes (Signed)
Patient Care Team: Carver Fila, MD as PCP - General (Gynecologic Oncology) Serena Croissant, MD as Consulting Physician (Hematology and Oncology) Lonie Peak, MD as Attending Physician (Radiation Oncology) Glenna Fellows, MD as Consulting Physician (Plastic Surgery) Axel Filler, Larna Daughters, NP as Nurse Practitioner (Hematology and Oncology) Manus Rudd, MD as Consulting Physician (General Surgery)  DIAGNOSIS: No diagnosis found.  SUMMARY OF ONCOLOGIC HISTORY: Oncology History  Ductal carcinoma in situ (DCIS) of right breast  04/11/2021 Initial Diagnosis   Screening mammogram detected right breast 2.7 cm indeterminate mass with calcifications, biopsy revealed low-grade DCIS with a focus of intermediate grade DCIS with necrosis involving intraductal papilloma, ER 100%, PR 15%   05/25/2021 Genetic Testing   GENETIC TEST RESULTS: Genetic testing reported through the CancerNext-Expanded+RNAinsight cancer panel found no additional pathogenic mutations, other than the original BRCA1 p.C1697R (c.5089T>C) Likely Pathogenic variant. The CancerNext-Expanded gene panel offered by [facility] and includes sequencing and rearrangement analysis for the following 77 genes: AIP, ALK, APC*, ATM*, AXIN2, BAP1, BARD1, BLM, BMPR1A, BRCA1*, BRCA2*, BRIP1*, CDC73, CDH1*, CDK4, CDKN1B, CDKN2A, CHEK2*, CTNNA1, DICER1, FANCC, FH, FLCN, GALNT12, KIF1B, LZTR1, MAX, MEN1, MET, MLH1*, MSH2*, MSH3, MSH6*, MUTYH*, NBN, NF1*, NF2, NTHL1, PALB2*, PHOX2B, PMS2*, POT1, PRKAR1A, PTCH1, PTEN*, RAD51C*, RAD51D*, RB1, RECQL, RET, SDHA, SDHAF2, SDHB, SDHC, SDHD, SMAD4, SMARCA4, SMARCB1, SMARCE1, STK11, SUFU, TMEM127, TP53*, TSC1, TSC2, VHL and XRCC2 (sequencing and deletion/duplication); EGFR, EGLN1, HOXB13, KIT, MITF, PDGFRA, POLD1, and POLE (sequencing only); EPCAM and GREM1   UPDATE: AXIN2 p.C241G VUS has been reclassified as likely benign.  The amended report date is March 29, 2022.   06/22/2021 Surgery    Bilateral mastectomies Left mastectomy: Fibrocystic changes Right mastectomy: DCIS with clear margins grade 2 ER 100% PR 15%   06/2021 -  Anti-estrogen oral therapy   Tamoxifen   12/28/2021 Cancer Staging   Staging form: Breast, AJCC 8th Edition - Clinical: Stage 0 (cTis (DCIS), cN0, cM0) - Signed by Loa Socks, NP on 12/28/2021     CHIEF COMPLIANT: DCIS, BRCA1 mutation    INTERVAL HISTORY: Toni Crawford is a 47 y.o. female is here because of recent diagnosis of right breast DCIS. She presents to the clinic for a follow-up.    ALLERGIES:  has No Known Allergies.  MEDICATIONS:  Current Outpatient Medications  Medication Sig Dispense Refill   Cholecalciferol (VITAMIN D) 50 MCG (2000 UT) CAPS Take 2,000 Units by mouth daily.     docusate sodium (COLACE) 100 MG capsule Take 1 capsule (100 mg total) by mouth daily. (Patient taking differently: Take 100 mg by mouth 2 (two) times daily.) 60 capsule 0   Ferrous Sulfate (IRON) 325 (65 Fe) MG TABS Take 650 mg by mouth daily in the afternoon.     ibuprofen (ADVIL) 800 MG tablet Take 1 tablet (800 mg total) by mouth every 8 (eight) hours as needed for moderate pain. For AFTER surgery only (Patient not taking: Reported on 05/31/2022) 30 tablet 0   Multiple Vitamins-Minerals (MULTIVITAMIN WITH MINERALS) tablet Take 1 tablet by mouth daily.     senna-docusate (SENOKOT-S) 8.6-50 MG tablet Take 2 tablets by mouth at bedtime. For AFTER surgery, do not take if having diarrhea (Patient not taking: Reported on 05/31/2022) 30 tablet 0   tamoxifen (NOLVADEX) 20 MG tablet Take 1 tablet (20 mg total) by mouth daily. (Patient not taking: Reported on 05/31/2022) 90 tablet 3   traMADol (ULTRAM) 50 MG tablet Take 1 tablet (50 mg total) by mouth every 6 (six) hours as  needed for severe pain. For AFTER surgery only, do not take and drive (Patient not taking: Reported on 05/31/2022) 15 tablet 0   No current facility-administered medications for this visit.     PHYSICAL EXAMINATION: ECOG PERFORMANCE STATUS: {CHL ONC ECOG [phone]}  There were no vitals filed for this visit. There were no vitals filed for this visit.  BREAST:*** No palpable masses or nodules in either right or left breasts. No palpable axillary supraclavicular or infraclavicular adenopathy no breast tenderness or nipple discharge. (exam performed in the presence of a chaperone)  LABORATORY DATA:  I have reviewed the data as listed    Latest Ref Rng & Units 05/31/2022    1:48 PM 05/04/2021    9:55 AM 06/19/2013   11:51 AM  CMP  Glucose 70 - 99 mg/dL 96  540  65   BUN 6 - 20 mg/dL 16  7    Creatinine 9.81 - 1.00 mg/dL 1.91  4.78    Sodium 295 - 145 mmol/L 141  141    Potassium 3.5 - 5.1 mmol/L 4.0  3.7    Chloride 98 - 111 mmol/L 105  109    CO2 22 - 32 mmol/L 28  27    Calcium 8.9 - 10.3 mg/dL 9.0  9.1    Total Protein 6.5 - 8.1 g/dL 7.3     Total Bilirubin 0.3 - 1.2 mg/dL 0.5     Alkaline Phos 38 - 126 U/L 54     AST 15 - 41 U/L 35     ALT 0 - 44 U/L 34       Lab Results  Component Value Date   WBC 4.5 05/31/2022   HGB 13.7 05/31/2022   HCT 42.8 05/31/2022   MCV 94.7 05/31/2022   PLT 211 05/31/2022   NEUTROABS 3.3 03/27/2021    ASSESSMENT & PLAN:  No problem-specific Assessment & Plan notes found for this encounter.    No orders of the defined types were placed in this encounter.  The patient has a good understanding of the overall plan. she agrees with it. she will call with any problems that may develop before the next visit here. Total time spent: 30 mins including face to face time and time spent for planning, charting and co-ordination of care   Sherlyn Lick, CMA 06/30/22    I Janan Ridge am acting as a Neurosurgeon for [employer]  ***

## 2022-07-02 ENCOUNTER — Other Ambulatory Visit: Payer: Self-pay

## 2022-07-02 ENCOUNTER — Inpatient Hospital Stay: Payer: Medicaid Other | Attending: Gynecologic Oncology | Admitting: Hematology and Oncology

## 2022-07-02 VITALS — BP 100/76 | HR 80 | Temp 97.7°F | Resp 18 | Ht 62.0 in | Wt 128.2 lb

## 2022-07-02 DIAGNOSIS — Z9013 Acquired absence of bilateral breasts and nipples: Secondary | ICD-10-CM | POA: Insufficient documentation

## 2022-07-02 DIAGNOSIS — Z1509 Genetic susceptibility to other malignant neoplasm: Secondary | ICD-10-CM

## 2022-07-02 DIAGNOSIS — D0511 Intraductal carcinoma in situ of right breast: Secondary | ICD-10-CM | POA: Diagnosis present

## 2022-07-02 DIAGNOSIS — Z79810 Long term (current) use of selective estrogen receptor modulators (SERMs): Secondary | ICD-10-CM | POA: Insufficient documentation

## 2022-07-02 DIAGNOSIS — Z1501 Genetic susceptibility to malignant neoplasm of breast: Secondary | ICD-10-CM | POA: Diagnosis not present

## 2022-07-02 NOTE — Assessment & Plan Note (Signed)
Cancer risk:  Breast - 72 percent Ovarian - 44 percent   Other gynecological malignancies: 1.  Fallopian tube cancers: 0.6% 2. primary peritoneal cancer: 1.3% 3.  Endometrial cancer: 1.9% 4.  Pancreatic cancer: 1%  Planning Hysterectomy and BSO Nov 2024

## 2022-07-02 NOTE — Assessment & Plan Note (Addendum)
06/22/2021:Bilateral mastectomies Left mastectomy: Fibrocystic changes Right mastectomy: DCIS with clear margins grade 2 ER 100% PR 15%   DCIS counseling: I discussed with her the clinical significance of DCIS and how it differs from invasive breast cancer. Having had bilateral mastectomies there is no further treatment necessary. Hysterectomy and bilateral salpingo-oophorectomy: 06/07/2022: Benign  Return to clinic on an as-needed basis

## 2022-07-12 ENCOUNTER — Inpatient Hospital Stay: Payer: Medicaid Other | Admitting: Gynecologic Oncology

## 2022-07-12 DIAGNOSIS — R9389 Abnormal findings on diagnostic imaging of other specified body structures: Secondary | ICD-10-CM

## 2022-07-12 NOTE — Progress Notes (Unsigned)
Gynecologic Oncology Return Clinic Visit  07/12/22  Reason for Visit: follow-up  Treatment History: Oncology History  Ductal carcinoma in situ (DCIS) of right breast  04/11/2021 Initial Diagnosis   Screening mammogram detected right breast 2.7 cm indeterminate mass with calcifications, biopsy revealed low-grade DCIS with a focus of intermediate grade DCIS with necrosis involving intraductal papilloma, ER 100%, PR 15%   05/25/2021 Genetic Testing   GENETIC TEST RESULTS: Genetic testing reported through the CancerNext-Expanded+RNAinsight cancer panel found no additional pathogenic mutations, other than the original BRCA1 p.C1697R (c.5089T>C) Likely Pathogenic variant. The CancerNext-Expanded gene panel offered by [facility] and includes sequencing and rearrangement analysis for the following 77 genes: AIP, ALK, APC*, ATM*, AXIN2, BAP1, BARD1, BLM, BMPR1A, BRCA1*, BRCA2*, BRIP1*, CDC73, CDH1*, CDK4, CDKN1B, CDKN2A, CHEK2*, CTNNA1, DICER1, FANCC, FH, FLCN, GALNT12, KIF1B, LZTR1, MAX, MEN1, MET, MLH1*, MSH2*, MSH3, MSH6*, MUTYH*, NBN, NF1*, NF2, NTHL1, PALB2*, PHOX2B, PMS2*, POT1, PRKAR1A, PTCH1, PTEN*, RAD51C*, RAD51D*, RB1, RECQL, RET, SDHA, SDHAF2, SDHB, SDHC, SDHD, SMAD4, SMARCA4, SMARCB1, SMARCE1, STK11, SUFU, TMEM127, TP53*, TSC1, TSC2, VHL and XRCC2 (sequencing and deletion/duplication); EGFR, EGLN1, HOXB13, KIT, MITF, PDGFRA, POLD1, and POLE (sequencing only); EPCAM and GREM1   UPDATE: AXIN2 p.C241G VUS has been reclassified as likely benign.  The amended report date is March 29, 2022.   06/22/2021 Surgery   Bilateral mastectomies Left mastectomy: Fibrocystic changes Right mastectomy: DCIS with clear margins grade 2 ER 100% PR 15%   06/2021 -  Anti-estrogen oral therapy   Tamoxifen   12/28/2021 Cancer Staging   Staging form: Breast, AJCC 8th Edition - Clinical: Stage 0 (cTis (DCIS), cN0, cM0) - Signed by Loa Socks, NP on 12/28/2021    06/07/22: Robotic-assisted  laparoscopic total hysterectomy with bilateral salpingo-oophorectomy  Findings: On EUA, small mobile uterus. No adnexal masses. On intra-abdominal entry, normal upper abdominal survey. Minimal adhesions in right mid abdomen in the setting of her appendectomy history. Normal omentum, small and large bowel. Uterus 8 cm and normal in appearance. Normal appearing bilateral adnexa. No ascites.   Interval History: ***  Past Medical/Surgical History: Past Medical History:  Diagnosis Date   DCIS (ductal carcinoma in situ)    Family history of colon cancer    Family history of ovarian cancer    Gestational diabetes    glyburide   Heart murmur    told with pregnancy--no symptoms   Tuberculosis    "when I was young" states she received treatment    Past Surgical History:  Procedure Laterality Date   APPENDECTOMY     BREAST RECONSTRUCTION WITH PLACEMENT OF TISSUE EXPANDER AND ALLODERM Bilateral 06/22/2021   Procedure: BREAST RECONSTRUCTION WITH PLACEMENT OF TISSUE EXPANDER AND ALLODERM;  Surgeon: Glenna Fellows, MD;  Location: [facility];  Service: Plastics;  Laterality: Bilateral;   REMOVAL OF BILATERAL TISSUE EXPANDERS WITH PLACEMENT OF BILATERAL BREAST IMPLANTS Bilateral 09/15/2021   Procedure: REMOVAL OF BILATERAL TISSUE EXPANDERS WITH PLACEMENT OF BILATERAL BREAST IMPLANTS;  Surgeon: Glenna Fellows, MD;  Location: [facility];  Service: Plastics;  Laterality: Bilateral;   ROBOTIC ASSISTED TOTAL HYSTERECTOMY WITH BILATERAL SALPINGO OOPHERECTOMY Bilateral 06/07/2022   Procedure: XI ROBOTIC ASSISTED TOTAL HYSTERECTOMY WITH BILATERAL SALPINGO OOPHORECTOMY;  Surgeon: Carver Fila, MD;  Location: [facility];  Service: Gynecology;  Laterality: Bilateral;   SIMPLE MASTECTOMY WITH AXILLARY SENTINEL NODE BIOPSY Bilateral 06/22/2021   Procedure: BILATERAL  MASTECTOMIES WITH IMMEDIATE RECONSTRUCTION, MAGTRACE INJECTION RIGHT BREAST;  Surgeon: Manus Rudd, MD;  Location:  [facility];  Service:  General;  Laterality: Bilateral;  LMA PEC BLOCK   UPPER GASTROINTESTINAL ENDOSCOPY      Family History  Problem Relation Age of Onset   Uterine cancer Mother    Diabetes Father    Kidney disease Father    Diabetes Sister    Uterine cancer Sister    Diabetes Sister    Colon cancer Maternal Uncle    Esophageal cancer Neg Hx    Rectal cancer Neg Hx    Stomach cancer Neg Hx     Social History   Socioeconomic History   Marital status: Married    Spouse name: Onalee Hua   Number of children: 4   Years of education: Not on file   Highest education level: 10th grade  Occupational History   Occupation: work as Designer, multimedia  Tobacco Use   Smoking status: Never    Passive exposure: Past   Smokeless tobacco: Former    Types: Snuff, Designer, multimedia Use   Vaping status: Never Used  Substance and Sexual Activity   Alcohol use: No   Drug use: No   Sexual activity: Yes    Birth control/protection: None  Other Topics Concern   Not on file  Social History Narrative   Not on file   Social Determinants of Health   Financial Resource Strain: Not on file  Food Insecurity: Not on file  Transportation Needs: No Transportation Needs (03/20/2018)   PRAPARE - Administrator, Civil Service (Medical): No    Lack of Transportation (Non-Medical): No  Physical Activity: Not on file  Stress: Not on file  Social Connections: Not on file    Current Medications:  Current Outpatient Medications:    Cholecalciferol (VITAMIN D) 50 MCG (2000 UT) CAPS, Take 2,000 Units by mouth daily., Disp: , Rfl:    docusate sodium (COLACE) 100 MG capsule, Take 1 capsule (100 mg total) by mouth daily. (Patient taking differently: Take 100 mg by mouth 2 (two) times daily.), Disp: 60 capsule, Rfl: 0   Ferrous Sulfate (IRON) 325 (65 Fe) MG TABS, Take 650 mg by mouth daily in the afternoon., Disp: , Rfl:    ibuprofen (ADVIL) 800 MG tablet, Take 1 tablet (800 mg total) by  mouth every 8 (eight) hours as needed for moderate pain. For AFTER surgery only (Patient not taking: Reported on 05/31/2022), Disp: 30 tablet, Rfl: 0   Multiple Vitamins-Minerals (MULTIVITAMIN WITH MINERALS) tablet, Take 1 tablet by mouth daily., Disp: , Rfl:    senna-docusate (SENOKOT-S) 8.6-50 MG tablet, Take 2 tablets by mouth at bedtime. For AFTER surgery, do not take if having diarrhea (Patient not taking: Reported on 05/31/2022), Disp: 30 tablet, Rfl: 0   tamoxifen (NOLVADEX) 20 MG tablet, Take 1 tablet (20 mg total) by mouth daily. (Patient not taking: Reported on 05/31/2022), Disp: 90 tablet, Rfl: 3   traMADol (ULTRAM) 50 MG tablet, Take 1 tablet (50 mg total) by mouth every 6 (six) hours as needed for severe pain. For AFTER surgery only, do not take and drive (Patient not taking: Reported on 05/31/2022), Disp: 15 tablet, Rfl: 0  Review of Systems: Denies appetite changes, fevers, chills, fatigue, unexplained weight changes. Denies hearing loss, neck lumps or masses, mouth sores, ringing in ears or voice changes. Denies cough or wheezing.  Denies shortness of breath. Denies chest pain or palpitations. Denies leg swelling. Denies abdominal distention, pain, blood in stools, constipation, diarrhea, nausea, vomiting, or early satiety. Denies pain with intercourse, dysuria, frequency, hematuria or incontinence. Denies  hot flashes, pelvic pain, vaginal bleeding or vaginal discharge.   Denies joint pain, back pain or muscle pain/cramps. Denies itching, rash, or wounds. Denies dizziness, headaches, numbness or seizures. Denies swollen lymph nodes or glands, denies easy bruising or bleeding. Denies anxiety, depression, confusion, or decreased concentration.  Physical Exam: LMP 08/29/2021 (Approximate)  General: ***Alert, oriented, no acute distress. HEENT: ***Posterior oropharynx clear, sclera anicteric. Chest: ***Clear to auscultation bilaterally.  ***Port site clean. Cardiovascular: ***Regular  rate and rhythm, no murmurs. Abdomen: ***Obese, soft, nontender.  Normoactive bowel sounds.  No masses or hepatosplenomegaly appreciated.  ***Well-healed scar. Extremities: ***Grossly normal range of motion.  Warm, well perfused.  No edema bilaterally. Skin: ***No rashes or lesions noted. Lymphatics: ***No cervical, supraclavicular, or inguinal adenopathy. GU: Normal appearing external genitalia without erythema, excoriation, or lesions.  Speculum exam reveals ***.  Bimanual exam reveals ***.  ***Rectovaginal exam  confirms ___.  Laboratory & Radiologic Studies: A. UTERUS, CERVIX, BILATERAL FALLOPIAN TUBES AND OVARIES:  - Uterus with benign inactive endometrium  - Benign unremarkable cervix  - Benign unremarkable bilateral fallopian tubes and ovaries  - No evidence of malignancy   Cytology FINAL MICROSCOPIC DIAGNOSIS:  - No malignant cells identified   Assessment & Plan: Toni Crawford is a 47 y.o. woman with known BRCA1 mutation and recent diagnosis of DCIS.  S/p recent risk-reducing TRH/BSO with benign final pathology.  ***The patient is doing well. Reviewed continued expectations and restrictions. Reviewed pathology from surgery. We discussed significant reduction in risk of gyn malignancy but small risk still of primary peritoneal cancer. I recommend a yearly visit with PCP, medical oncologist, or other provider. We reviewed signs and symptoms that should prompt a phone call.  *** minutes of total time was spent for this patient encounter, including preparation, face-to-face counseling with the patient and coordination of care, and documentation of the encounter.  Eugene Garnet, MD  Division of Gynecologic Oncology  Department of Obstetrics and Gynecology  [facility]

## 2022-07-13 ENCOUNTER — Telehealth: Payer: Self-pay | Admitting: *Deleted

## 2022-07-13 NOTE — Telephone Encounter (Signed)
Attempted to reach Toni Crawford to reschedule her post op appointment with Dr. Pricilla Holm. [place] interpreter id # [phone] left a voicemail for patient requesting call back to reschedule her appt. [phone].

## 2022-07-13 NOTE — Telephone Encounter (Signed)
-----   Message from Carver Fila sent at 07/13/2022  4:28 PM EDT ----- Could someone please call her to see if we can get her rescheduled for missed appt yesterday? Thank you!

## 2022-07-16 NOTE — Telephone Encounter (Signed)
-----   Message from Carver Fila sent at 07/13/2022  4:28 PM EDT ----- Could someone please call her to see if we can get her rescheduled for missed appt yesterday? Thank you!

## 2022-07-16 NOTE — Telephone Encounter (Signed)
Spoke with Toni Crawford through [employer] interpreter # [phone] and rescheduled her post op follow up visit with Dr.Tucker for Thursday, August 14 th. At 0845, patient agreed to date and time and had no further questions or concerns. Patients med list/allergies has been update on this call as well.

## 2022-08-03 ENCOUNTER — Telehealth: Payer: Self-pay | Admitting: Oncology

## 2022-08-03 NOTE — Telephone Encounter (Signed)
Called Davd (husband) and rescheduled Bexlee's appointment with Dr. Pricilla Holm to 12:45.

## 2022-08-13 ENCOUNTER — Telehealth: Payer: Self-pay | Admitting: *Deleted

## 2022-08-13 ENCOUNTER — Encounter: Payer: Self-pay | Admitting: Gynecologic Oncology

## 2022-08-13 NOTE — Telephone Encounter (Signed)
Spoke with Toni Crawford through [place] interpreter # [phone] for reminder of upcoming appointment on Thursday, August 15 th at 1245 arrival at 1230 with Dr. Pricilla Holm. [employer] completed on this call. Pt states she had an injury to her right index finger about two weeks ago that she needed to have stitches at Urgent Care and they have already removed the stiches and it's healing well. Pt verbalized understanding of upcoming appointment and had no further questions or concerns at this time.

## 2022-08-15 NOTE — Progress Notes (Unsigned)
Gynecologic Oncology Return Clinic Visit  08/16/22  Reason for Visit: follow-up  Treatment History: Oncology History  Ductal carcinoma in situ (DCIS) of right breast  04/11/2021 Initial Diagnosis   Screening mammogram detected right breast 2.7 cm indeterminate mass with calcifications, biopsy revealed low-grade DCIS with a focus of intermediate grade DCIS with necrosis involving intraductal papilloma, ER 100%, PR 15%   05/25/2021 Genetic Testing   GENETIC TEST RESULTS: Genetic testing reported through the CancerNext-Expanded+RNAinsight cancer panel found no additional pathogenic mutations, other than the original BRCA1 p.C1697R (c.5089T>C) Likely Pathogenic variant. The CancerNext-Expanded gene panel offered by [facility] and includes sequencing and rearrangement analysis for the following 77 genes: AIP, ALK, APC*, ATM*, AXIN2, BAP1, BARD1, BLM, BMPR1A, BRCA1*, BRCA2*, BRIP1*, CDC73, CDH1*, CDK4, CDKN1B, CDKN2A, CHEK2*, CTNNA1, DICER1, FANCC, FH, FLCN, GALNT12, KIF1B, LZTR1, MAX, MEN1, MET, MLH1*, MSH2*, MSH3, MSH6*, MUTYH*, NBN, NF1*, NF2, NTHL1, PALB2*, PHOX2B, PMS2*, POT1, PRKAR1A, PTCH1, PTEN*, RAD51C*, RAD51D*, RB1, RECQL, RET, SDHA, SDHAF2, SDHB, SDHC, SDHD, SMAD4, SMARCA4, SMARCB1, SMARCE1, STK11, SUFU, TMEM127, TP53*, TSC1, TSC2, VHL and XRCC2 (sequencing and deletion/duplication); EGFR, EGLN1, HOXB13, KIT, MITF, PDGFRA, POLD1, and POLE (sequencing only); EPCAM and GREM1   UPDATE: AXIN2 p.C241G VUS has been reclassified as likely benign.  The amended report date is March 29, 2022.   06/22/2021 Surgery   Bilateral mastectomies Left mastectomy: Fibrocystic changes Right mastectomy: DCIS with clear margins grade 2 ER 100% PR 15%   06/2021 -  Anti-estrogen oral therapy   Tamoxifen   12/28/2021 Cancer Staging   Staging form: Breast, AJCC 8th Edition - Clinical: Stage 0 (cTis (DCIS), cN0, cM0) - Signed by Loa Socks, NP on 12/28/2021    06/07/22: Robotic-assisted  laparoscopic total hysterectomy with bilateral salpingo-oophorectomy  Findings: On EUA, small mobile uterus. No adnexal masses. On intra-abdominal entry, normal upper abdominal survey. Minimal adhesions in right mid abdomen in the setting of her appendectomy history. Normal omentum, small and large bowel. Uterus 8 cm and normal in appearance. Normal appearing bilateral adnexa. No ascites.   Interval History: The patient reports doing well.  Denies any significant abdominal or pelvic pain.  Reports normal bowel bladder function.  Denies any vaginal bleeding.  Past Medical/Surgical History: Past Medical History:  Diagnosis Date   DCIS (ductal carcinoma in situ)    Family history of colon cancer    Family history of ovarian cancer    Gestational diabetes    glyburide   Heart murmur    told with pregnancy--no symptoms   Tuberculosis    "when I was young" states she received treatment    Past Surgical History:  Procedure Laterality Date   APPENDECTOMY     BREAST RECONSTRUCTION WITH PLACEMENT OF TISSUE EXPANDER AND ALLODERM Bilateral 06/22/2021   Procedure: BREAST RECONSTRUCTION WITH PLACEMENT OF TISSUE EXPANDER AND ALLODERM;  Surgeon: Glenna Fellows, MD;  Location: [facility];  Service: Plastics;  Laterality: Bilateral;   REMOVAL OF BILATERAL TISSUE EXPANDERS WITH PLACEMENT OF BILATERAL BREAST IMPLANTS Bilateral 09/15/2021   Procedure: REMOVAL OF BILATERAL TISSUE EXPANDERS WITH PLACEMENT OF BILATERAL BREAST IMPLANTS;  Surgeon: Glenna Fellows, MD;  Location: [facility];  Service: Plastics;  Laterality: Bilateral;   ROBOTIC ASSISTED TOTAL HYSTERECTOMY WITH BILATERAL SALPINGO OOPHERECTOMY Bilateral 06/07/2022   Procedure: XI ROBOTIC ASSISTED TOTAL HYSTERECTOMY WITH BILATERAL SALPINGO OOPHORECTOMY;  Surgeon: Carver Fila, MD;  Location: [facility];  Service: Gynecology;  Laterality: Bilateral;   SIMPLE MASTECTOMY WITH AXILLARY SENTINEL NODE BIOPSY Bilateral  06/22/2021   Procedure:  BILATERAL  MASTECTOMIES WITH IMMEDIATE RECONSTRUCTION, MAGTRACE INJECTION RIGHT BREAST;  Surgeon: Manus Rudd, MD;  Location: [facility];  Service: General;  Laterality: Bilateral;  LMA PEC BLOCK   UPPER GASTROINTESTINAL ENDOSCOPY      Family History  Problem Relation Age of Onset   Uterine cancer Mother    Diabetes Father    Kidney disease Father    Diabetes Sister    Uterine cancer Sister    Diabetes Sister    Colon cancer Maternal Uncle    Esophageal cancer Neg Hx    Rectal cancer Neg Hx    Stomach cancer Neg Hx     Social History   Socioeconomic History   Marital status: Married    Spouse name: Onalee Hua   Number of children: 4   Years of education: Not on file   Highest education level: 10th grade  Occupational History   Occupation: work as Designer, multimedia  Tobacco Use   Smoking status: Never    Passive exposure: Past   Smokeless tobacco: Former    Types: Snuff, Designer, multimedia Use   Vaping status: Never Used  Substance and Sexual Activity   Alcohol use: No   Drug use: No   Sexual activity: Yes    Birth control/protection: None  Other Topics Concern   Not on file  Social History Narrative   Not on file   Social Determinants of Health   Financial Resource Strain: Not on file  Food Insecurity: Not on file  Transportation Needs: No Transportation Needs (03/20/2018)   PRAPARE - Administrator, Civil Service (Medical): No    Lack of Transportation (Non-Medical): No  Physical Activity: Not on file  Stress: Not on file  Social Connections: Not on file    Current Medications:  Current Outpatient Medications:    Cholecalciferol (VITAMIN D) 50 MCG (2000 UT) CAPS, Take 2,000 Units by mouth daily., Disp: , Rfl:    Ferrous Sulfate (IRON) 325 (65 Fe) MG TABS, Take 650 mg by mouth daily in the afternoon., Disp: , Rfl:    Multiple Vitamins-Minerals (MULTIVITAMIN WITH MINERALS) tablet, Take 1 tablet by mouth daily., Disp: ,  Rfl:   Review of Systems: Denies appetite changes, fevers, chills, fatigue, unexplained weight changes. Denies hearing loss, neck lumps or masses, mouth sores, ringing in ears or voice changes. Denies cough or wheezing.  Denies shortness of breath. Denies chest pain or palpitations. Denies leg swelling. Denies abdominal distention, pain, blood in stools, constipation, diarrhea, nausea, vomiting, or early satiety. Denies pain with intercourse, dysuria, frequency, hematuria or incontinence. Denies hot flashes, pelvic pain, vaginal bleeding or vaginal discharge.   Denies joint pain, back pain or muscle pain/cramps. Denies itching, rash, or wounds. Denies dizziness, headaches, numbness or seizures. Denies swollen lymph nodes or glands, denies easy bruising or bleeding. Denies anxiety, depression, confusion, or decreased concentration.  Physical Exam: BP 118/79 (BP Location: Left Arm, Patient Position: Sitting)   Pulse 89   Temp 98.5 F (36.9 C) (Oral)   Resp 16   Wt 126 lb 3.2 oz (57.2 kg)   LMP 08/29/2021 (Approximate)   SpO2 98%   BMI 23.08 kg/m  General: Alert, oriented, no acute distress. HEENT: Normocephalic, atraumatic, sclera anicteric. Chest: Unlabored breathing on room air. Abdomen: soft, nontender.  Normoactive bowel sounds, mildly distended.  No masses or hepatosplenomegaly appreciated.  Well-healed incisions. Extremities: Grossly normal range of motion.  Warm, well perfused.  No edema bilaterally. Skin: No rashes or lesions noted.  Lymphatics: No cervical, supraclavicular, or inguinal adenopathy. GU: Normal appearing external genitalia without erythema, excoriation, or lesions.  Speculum exam reveals cuff intact, suture visible, no blood or discharge.  Bimanual exam reveals intact, no tenderness or fluctuance on palpation.    Laboratory & Radiologic Studies: A. UTERUS, CERVIX, BILATERAL FALLOPIAN TUBES AND OVARIES:  - Uterus with benign inactive endometrium  - Benign  unremarkable cervix  - Benign unremarkable bilateral fallopian tubes and ovaries  - No evidence of malignancy    Cytology FINAL MICROSCOPIC DIAGNOSIS:  - No malignant cells identified   ADDENDUM: - The adnexa were serially sectioned for examination.   Assessment & Plan: Toni Crawford is a 47 y.o. woman with known BRCA1 mutation and recent diagnosis of DCIS.  S/p recent risk-reducing TRH/BSO with benign final pathology.  Patient continues to do very well after surgery.  Discussed continued expectations and restrictions.  She was given a copy of her pathology report.  We discussed significant reduction in risk of gyn malignancy but small risk still of primary peritoneal cancer. I recommend a yearly visit with PCP, medical oncologist, or other provider. We reviewed signs and symptoms that should prompt a phone call.  18 minutes of total time was spent for this patient encounter, including preparation, face-to-face counseling with the patient and coordination of care, and documentation of the encounter.  Eugene Garnet, MD  Division of Gynecologic Oncology  Department of Obstetrics and Gynecology  [facility]

## 2022-08-16 ENCOUNTER — Encounter: Payer: Medicaid Other | Admitting: Gynecologic Oncology

## 2022-08-16 ENCOUNTER — Other Ambulatory Visit: Payer: Self-pay

## 2022-08-16 ENCOUNTER — Encounter: Payer: Self-pay | Admitting: Gynecologic Oncology

## 2022-08-16 ENCOUNTER — Inpatient Hospital Stay: Payer: Medicaid Other | Attending: Gynecologic Oncology | Admitting: Gynecologic Oncology

## 2022-08-16 VITALS — BP 118/79 | HR 89 | Temp 98.5°F | Resp 16 | Wt 126.2 lb

## 2022-08-16 DIAGNOSIS — Z1501 Genetic susceptibility to malignant neoplasm of breast: Secondary | ICD-10-CM

## 2022-08-16 DIAGNOSIS — R9389 Abnormal findings on diagnostic imaging of other specified body structures: Secondary | ICD-10-CM

## 2022-08-16 DIAGNOSIS — Z90722 Acquired absence of ovaries, bilateral: Secondary | ICD-10-CM

## 2022-08-16 DIAGNOSIS — Z1509 Genetic susceptibility to other malignant neoplasm: Secondary | ICD-10-CM

## 2022-08-16 DIAGNOSIS — Z148 Genetic carrier of other disease: Secondary | ICD-10-CM

## 2022-08-16 DIAGNOSIS — Z1502 Genetic susceptibility to malignant neoplasm of ovary: Secondary | ICD-10-CM

## 2022-08-16 NOTE — Patient Instructions (Signed)
It was good to see you today.  You are healing well from surgery.  You are welcome to return to normal activity 6 weeks after surgery although nothing in the vagina for at least 10 weeks after the surgery.  Please reach out if you need anything in the future.

## 2022-11-08 ENCOUNTER — Telehealth: Payer: Self-pay | Admitting: *Deleted

## 2022-11-08 NOTE — Telephone Encounter (Signed)
Please schedule her a visit to come in for an exam.

## 2022-11-08 NOTE — Telephone Encounter (Signed)
Spoke with Patient's husband Onalee Hua who called the office stating his wife Tecla started having vaginal bleeding three days ago. Pt is 5 months post hysterectomy.   States the bleeding is spotty bright red but light and she is only wearing a panty liner and changing it once daily. Denies fever, but states has had chills on and off since last Sunday with no fever. Last checked her temperature and it was 98. Lanaiya denies pain and or cramping. She is having regular bowel movements without straining and denies all urinary symptoms.   States the bleeding happens right after sexual intercourse. States she followed all activity restrictions for the 3 month period. Onalee Hua states she was doing some heavy lifting last week and notices the bleeding after being active but also wakes up and feels wetness and notices the spotty then as well. Pt is not taking any hormonal medications. Advised patient to monitor the bleeding and if it gets heavy, passes clots go to the ED. Also advised that her message would be relayed to providers and the office would call back with recommendations.

## 2022-11-08 NOTE — Telephone Encounter (Signed)
Spoke with Patient's husband Onalee Hua and scheduled patient for an appointment and exam with Dr. Pricilla Holm for Friday, November 8 th at 4:30 pm. Pt's husband agreed to appointment and would let his wife Toni Crawford know to arrive by 4:15. No further concerns at this time.

## 2022-11-09 ENCOUNTER — Inpatient Hospital Stay: Payer: Medicaid Other | Attending: Gynecologic Oncology | Admitting: Gynecologic Oncology

## 2022-11-09 ENCOUNTER — Encounter: Payer: Self-pay | Admitting: Gynecologic Oncology

## 2022-11-09 VITALS — BP 116/80 | HR 88 | Temp 98.6°F | Resp 19 | Wt 126.6 lb

## 2022-11-09 DIAGNOSIS — N898 Other specified noninflammatory disorders of vagina: Secondary | ICD-10-CM | POA: Insufficient documentation

## 2022-11-09 DIAGNOSIS — N939 Abnormal uterine and vaginal bleeding, unspecified: Secondary | ICD-10-CM | POA: Insufficient documentation

## 2022-11-09 DIAGNOSIS — R300 Dysuria: Secondary | ICD-10-CM | POA: Diagnosis not present

## 2022-11-09 DIAGNOSIS — Z148 Genetic carrier of other disease: Secondary | ICD-10-CM | POA: Insufficient documentation

## 2022-11-09 DIAGNOSIS — D0511 Intraductal carcinoma in situ of right breast: Secondary | ICD-10-CM | POA: Diagnosis not present

## 2022-11-09 DIAGNOSIS — Z90722 Acquired absence of ovaries, bilateral: Secondary | ICD-10-CM | POA: Diagnosis not present

## 2022-11-09 DIAGNOSIS — Z1501 Genetic susceptibility to malignant neoplasm of breast: Secondary | ICD-10-CM | POA: Diagnosis not present

## 2022-11-09 DIAGNOSIS — R102 Pelvic and perineal pain: Secondary | ICD-10-CM | POA: Diagnosis not present

## 2022-11-09 DIAGNOSIS — Z90710 Acquired absence of both cervix and uterus: Secondary | ICD-10-CM | POA: Insufficient documentation

## 2022-11-09 DIAGNOSIS — Z1509 Genetic susceptibility to other malignant neoplasm: Secondary | ICD-10-CM | POA: Insufficient documentation

## 2022-11-09 LAB — URINALYSIS, COMPLETE (UACMP) WITH MICROSCOPIC
Bacteria, UA: NONE SEEN
Bilirubin Urine: NEGATIVE
Glucose, UA: NEGATIVE mg/dL
Ketones, ur: NEGATIVE mg/dL
Nitrite: NEGATIVE
Protein, ur: NEGATIVE mg/dL
Specific Gravity, Urine: 1.005 (ref 1.005–1.030)
pH: 8 (ref 5.0–8.0)

## 2022-11-09 NOTE — Progress Notes (Signed)
Gynecologic Oncology Return Clinic Visit  11/09/22  Reason for Visit: vaginal bleeding  Treatment History: Oncology History  Ductal carcinoma in situ (DCIS) of right breast  04/11/2021 Initial Diagnosis   Screening mammogram detected right breast 2.7 cm indeterminate mass with calcifications, biopsy revealed low-grade DCIS with a focus of intermediate grade DCIS with necrosis involving intraductal papilloma, ER 100%, PR 15%   05/25/2021 Genetic Testing   GENETIC TEST RESULTS: Genetic testing reported through the CancerNext-Expanded+RNAinsight cancer panel found no additional pathogenic mutations, other than the original BRCA1 p.C1697R (c.5089T>C) Likely Pathogenic variant. The CancerNext-Expanded gene panel offered by [facility] and includes sequencing and rearrangement analysis for the following 77 genes: AIP, ALK, APC*, ATM*, AXIN2, BAP1, BARD1, BLM, BMPR1A, BRCA1*, BRCA2*, BRIP1*, CDC73, CDH1*, CDK4, CDKN1B, CDKN2A, CHEK2*, CTNNA1, DICER1, FANCC, FH, FLCN, GALNT12, KIF1B, LZTR1, MAX, MEN1, MET, MLH1*, MSH2*, MSH3, MSH6*, MUTYH*, NBN, NF1*, NF2, NTHL1, PALB2*, PHOX2B, PMS2*, POT1, PRKAR1A, PTCH1, PTEN*, RAD51C*, RAD51D*, RB1, RECQL, RET, SDHA, SDHAF2, SDHB, SDHC, SDHD, SMAD4, SMARCA4, SMARCB1, SMARCE1, STK11, SUFU, TMEM127, TP53*, TSC1, TSC2, VHL and XRCC2 (sequencing and deletion/duplication); EGFR, EGLN1, HOXB13, KIT, MITF, PDGFRA, POLD1, and POLE (sequencing only); EPCAM and GREM1   UPDATE: AXIN2 p.C241G VUS has been reclassified as likely benign.  The amended report date is March 29, 2022.   06/22/2021 Surgery   Bilateral mastectomies Left mastectomy: Fibrocystic changes Right mastectomy: DCIS with clear margins grade 2 ER 100% PR 15%   06/2021 -  Anti-estrogen oral therapy   Tamoxifen   12/28/2021 Cancer Staging   Staging form: Breast, AJCC 8th Edition - Clinical: Stage 0 (cTis (DCIS), cN0, cM0) - Signed by Loa Socks, NP on 12/28/2021     Interval  History: Patient endorses 2 episodes of vaginal spotting at night and 2 in the morning over the last week.  1 of these occurred after intercourse.  She also endorses some mild suprapubic pain that seems to happen when she needs to urinate.  She reports normal bowel function.  Past Medical/Surgical History: Past Medical History:  Diagnosis Date   DCIS (ductal carcinoma in situ)    Family history of colon cancer    Family history of ovarian cancer    Gestational diabetes    glyburide   Heart murmur    told with pregnancy--no symptoms   Tuberculosis    "when I was young" states she received treatment    Past Surgical History:  Procedure Laterality Date   APPENDECTOMY     BREAST RECONSTRUCTION WITH PLACEMENT OF TISSUE EXPANDER AND ALLODERM Bilateral 06/22/2021   Procedure: BREAST RECONSTRUCTION WITH PLACEMENT OF TISSUE EXPANDER AND ALLODERM;  Surgeon: Glenna Fellows, MD;  Location: [facility];  Service: Plastics;  Laterality: Bilateral;   REMOVAL OF BILATERAL TISSUE EXPANDERS WITH PLACEMENT OF BILATERAL BREAST IMPLANTS Bilateral 09/15/2021   Procedure: REMOVAL OF BILATERAL TISSUE EXPANDERS WITH PLACEMENT OF BILATERAL BREAST IMPLANTS;  Surgeon: Glenna Fellows, MD;  Location: [facility];  Service: Plastics;  Laterality: Bilateral;   ROBOTIC ASSISTED TOTAL HYSTERECTOMY WITH BILATERAL SALPINGO OOPHERECTOMY Bilateral 06/07/2022   Procedure: XI ROBOTIC ASSISTED TOTAL HYSTERECTOMY WITH BILATERAL SALPINGO OOPHORECTOMY;  Surgeon: Carver Fila, MD;  Location: [facility];  Service: Gynecology;  Laterality: Bilateral;   SIMPLE MASTECTOMY WITH AXILLARY SENTINEL NODE BIOPSY Bilateral 06/22/2021   Procedure: BILATERAL  MASTECTOMIES WITH IMMEDIATE RECONSTRUCTION, MAGTRACE INJECTION RIGHT BREAST;  Surgeon: Manus Rudd, MD;  Location: [facility];  Service: General;  Laterality: Bilateral;  LMA PEC BLOCK   UPPER  GASTROINTESTINAL ENDOSCOPY      Family  History  Problem Relation Age of Onset   Uterine cancer Mother    Diabetes Father    Kidney disease Father    Diabetes Sister    Uterine cancer Sister    Diabetes Sister    Colon cancer Maternal Uncle    Esophageal cancer Neg Hx    Rectal cancer Neg Hx    Stomach cancer Neg Hx     Social History   Socioeconomic History   Marital status: Married    Spouse name: Onalee Hua   Number of children: 4   Years of education: Not on file   Highest education level: 10th grade  Occupational History   Occupation: work as Designer, multimedia  Tobacco Use   Smoking status: Never    Passive exposure: Past   Smokeless tobacco: Former    Types: Snuff, Designer, multimedia Use   Vaping status: Never Used  Substance and Sexual Activity   Alcohol use: No   Drug use: No   Sexual activity: Yes    Birth control/protection: None  Other Topics Concern   Not on file  Social History Narrative   Not on file   Social Determinants of Health   Financial Resource Strain: Not on file  Food Insecurity: Not on file  Transportation Needs: No Transportation Needs (03/20/2018)   PRAPARE - Administrator, Civil Service (Medical): No    Lack of Transportation (Non-Medical): No  Physical Activity: Not on file  Stress: Not on file  Social Connections: Not on file    Current Medications:  Current Outpatient Medications:    Cholecalciferol (VITAMIN D) 50 MCG (2000 UT) CAPS, Take 2,000 Units by mouth daily., Disp: , Rfl:    Ferrous Sulfate (IRON) 325 (65 Fe) MG TABS, Take 650 mg by mouth daily in the afternoon., Disp: , Rfl:    Multiple Vitamins-Minerals (MULTIVITAMIN WITH MINERALS) tablet, Take 1 tablet by mouth daily., Disp: , Rfl:   Review of Systems: Denies appetite changes, fevers, chills, fatigue, unexplained weight changes. Denies hearing loss, neck lumps or masses, mouth sores, ringing in ears or voice changes. Denies cough or wheezing.  Denies shortness of breath. Denies chest pain or palpitations.  Denies leg swelling. Denies abdominal distention, pain, blood in stools, constipation, diarrhea, nausea, vomiting, or early satiety. Denies pain with intercourse, dysuria, frequency, hematuria or incontinence. Denies hot flashes or vaginal discharge.   Denies joint pain, back pain or muscle pain/cramps. Denies itching, rash, or wounds. Denies dizziness, headaches, numbness or seizures. Denies swollen lymph nodes or glands, denies easy bruising or bleeding. Denies anxiety, depression, confusion, or decreased concentration.  Physical Exam: BP 116/80 (BP Location: Right Arm, Patient Position: Sitting)   Pulse 88   Temp 98.6 F (37 C) (Oral)   Resp 19   Wt 126 lb 9.6 oz (57.4 kg)   LMP 08/29/2021 (Approximate)   SpO2 100%   BMI 23.16 kg/m  General: Alert, oriented, no acute distress. HEENT: Normocephalic, atraumatic, sclera anicteric. Abdomen: soft, nontender.  Normoactive bowel sounds.  No masses or hepatosplenomegaly appreciated.  Well-healed incisions. Extremities: Grossly normal range of motion.  Warm, well perfused.  No edema bilaterally. GU: Normal appearing external genitalia without erythema, excoriation, or lesions.  Speculum exam reveals cuff is intact.  There is a 1 cm polypoid appearing area of what I suspect is granulation tissue.  No other lesions noted.  No other possible sources of bleeding identified.  There is no blood  or discharge within the vaginal vault.  This area was cleansed with Betadine x 3.  Ring forceps were used to grasp and twist the piece of tissue on its stalk 3 times before it spontaneously detached.  Nitrate was used to achieve hemostasis.  Bimanual exam reveals cuff is intact without Ender Ness to palpation.  Laboratory & Radiologic Studies: None new  Assessment & Plan: Toni Crawford is a 47 y.o. woman with known BRCA1 mutation and recent diagnosis of DCIS.  S/p risk-reducing TRH/BSO with benign final pathology in 06/2022.  Patient presenting with  several episodes of vaginal spotting over the last week.  Exam findings consistent with granulation tissue.  This was removed today.  Area treated with silver nitrate.  Discussed findings with the patient and also called her husband to relay the same information.  Reviewed that it would be normal to have spotting for the next several days.  Given her suprapubic pain and pain when she needs to urinate, urinalysis and culture were sent today.  I will notify her with these results once back.  I asked the patient and her husband to call the clinic if her symptoms have not resolved by next week.  20 minutes of total time was spent for this patient encounter, including preparation, face-to-face counseling with the patient and coordination of care, and documentation of the encounter.  Eugene Garnet, MD  Division of Gynecologic Oncology  Department of Obstetrics and Gynecology  [facility]

## 2022-11-09 NOTE — Patient Instructions (Signed)
It was good to see you today.  There was a small amount of scar tissue at the top of the vagina that is called granulation tissue.  This often can be the source of spotting or bleeding.  I remove this today.  You may have a little bit of bleeding the next few days related to removing this tissue.  I will let you know if it looks like you have a urine infection.  If symptoms do not improve (if you have continued bleeding or pain), please call the office next week.

## 2022-11-11 LAB — URINE CULTURE: Culture: NO GROWTH

## 2022-11-12 ENCOUNTER — Telehealth: Payer: Self-pay | Admitting: *Deleted

## 2022-11-12 NOTE — Telephone Encounter (Signed)
-----   Message from Carver Fila sent at 11/12/2022  7:29 AM EST ----- Could you please let this patient know that her culture shows no urine infection? Thank you

## 2022-11-12 NOTE — Telephone Encounter (Signed)
Attempted to reach Ms. Folker through [employer] # id [phone] left voicemail requesting call back.   Patient alternate number called through pacific interpreter and Patient's husband Toni Crawford answered. Relayed message from Dr. Pricilla Holm that [facility]'s culture shows no urine infection.  Georgina Quint the office for calling and would relay this message to his wife.

## 2022-11-12 NOTE — Progress Notes (Signed)
Could you please let this patient know that her culture shows no urine infection? Thank you

## 2022-11-13 LAB — SURGICAL PATHOLOGY

## 2022-11-14 ENCOUNTER — Telehealth: Payer: Self-pay | Admitting: *Deleted

## 2022-11-14 NOTE — Progress Notes (Signed)
Thanks Marijean Niemann. I sent a message asking if you could call to let her know biopsy showed granulation tissue (scar tissue). Could you ask her about bleeding when you call?

## 2022-11-14 NOTE — Telephone Encounter (Signed)
Spoke with patient Toni Crawford's spouse Toni Crawford and relayed message from Dr. Pricilla Holm that patient's biopsy showed granulation tissue (scar tissue). Toni Crawford verbalized understanding.   States that is wife is not having any bleeding, just small spots of blood, but she is feeling uncomfortable since biopsy and rates the soreness 3/4 and is not taking anything for it. Pt denies fever and or chills. Pt also denies urinary symptoms and is having regular bowel movements. Husband states the discomfort is the area of biopsy, inside pelvis. Advised if Toni Crawford's symptoms continue or worsen to call the office at [phone], after hours number [phone]. Also advised Message would be relayed to Dr. Pricilla Holm. Toni Crawford the office for calling.

## 2022-11-14 NOTE — Telephone Encounter (Signed)
-----   Message from Carver Fila sent at 11/14/2022  7:14 AM EST ----- Thanks Marijean Niemann. I sent a message asking if you could call to let her know biopsy showed granulation tissue (scar tissue). Could you ask her about bleeding when you call?

## 2023-06-09 ENCOUNTER — Ambulatory Visit
Admission: EM | Admit: 2023-06-09 | Discharge: 2023-06-09 | Disposition: A | Discharge status: Home / Self Care | Attending: Family Medicine | Admitting: Family Medicine

## 2023-06-09 ENCOUNTER — Other Ambulatory Visit: Payer: Self-pay

## 2023-06-09 DIAGNOSIS — R197 Diarrhea, unspecified: Secondary | ICD-10-CM

## 2023-06-09 DIAGNOSIS — R110 Nausea: Secondary | ICD-10-CM | POA: Diagnosis not present

## 2023-06-09 DIAGNOSIS — R109 Unspecified abdominal pain: Secondary | ICD-10-CM

## 2023-06-09 MED ORDER — ONDANSETRON 4 MG PO TBDP: 4.0000 mg | ORAL_TABLET | Freq: Three times a day (TID) | ORAL | 0 refills | Status: AC | PRN

## 2023-06-09 MED ORDER — DICYCLOMINE HCL 20 MG PO TABS: 20.0000 mg | ORAL_TABLET | Freq: Three times a day (TID) | ORAL | 0 refills | Status: AC | PRN

## 2023-06-09 NOTE — ED Triage Notes (Signed)
 Pt states that on Wednesday of last week she began having diarrhea and a fever (did not take with a thermometer). Pt has taken Tylenol  and Ibuprofen  and antidiarrheal.

## 2023-06-09 NOTE — ED Provider Notes (Signed)
 [facility]    CSN: [id number] Arrival date & time: 06/09/23  0831  Burmese interpreter [phone] for entire visit    History   Chief Complaint Chief Complaint  Patient presents with   Diarrhea   Fever    HPI [facility] is a 48 y.o. female.   The history is provided by the patient and the spouse. A language interpreter was used.  Diarrhea Associated symptoms: abdominal pain and fever   Associated symptoms: no chills, no diaphoresis, no headaches, no myalgias and no vomiting   Fever Associated symptoms: diarrhea and nausea   Associated symptoms: no chills, no congestion, no cough, no dysuria, no headaches, no myalgias, no rhinorrhea, no sore throat and no vomiting   Diarrhea onset 4 days ago, subjective, intermittent associated with multiple episodes of green watery diarrhea and nausea.  Daughter and spouse have been sick as well.  Denies bad food exposure or recent travel.  Admits abdominal cramping intermittent worse before bowel movements.  Eating less but tolerating some fluids.  Taking over-the-counter antidiarrheal agent with some relief.  Symptoms are gradually improving.  Past medical history includes breast cancer and hysterectomy  Past Medical History:  Diagnosis Date   DCIS (ductal carcinoma in situ)    Family history of colon cancer    Family history of ovarian cancer    Gestational diabetes    glyburide    Heart murmur    told with pregnancy--no symptoms   Tuberculosis    "when I was young" states she received treatment    Patient Active Problem List   Diagnosis Date Noted   Thickened endometrium 06/07/2022   Breast cancer, right (HCC) 06/22/2021   BRCA1 gene mutation positive 05/18/2021   Genetic testing 05/11/2021   Family history of colon cancer 05/03/2021   Family history of ovarian cancer 05/03/2021   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   Gestational diabetes mellitus in third trimester 06/22/2013   AMA 04/13/2013    Supervision of other normal pregnancy 04/13/2013   Language barrier 04/13/2013    Past Surgical History:  Procedure Laterality Date   APPENDECTOMY     BREAST RECONSTRUCTION WITH PLACEMENT OF TISSUE EXPANDER AND ALLODERM Bilateral 06/22/2021   Procedure: BREAST RECONSTRUCTION WITH PLACEMENT OF TISSUE EXPANDER AND ALLODERM;  Surgeon: Alger Infield, MD;  Location: [facility];  Service: Plastics;  Laterality: Bilateral;   REMOVAL OF BILATERAL TISSUE EXPANDERS WITH PLACEMENT OF BILATERAL BREAST IMPLANTS Bilateral 09/15/2021   Procedure: REMOVAL OF BILATERAL TISSUE EXPANDERS WITH PLACEMENT OF BILATERAL BREAST IMPLANTS;  Surgeon: Alger Infield, MD;  Location: [facility];  Service: Plastics;  Laterality: Bilateral;   ROBOTIC ASSISTED TOTAL HYSTERECTOMY WITH BILATERAL SALPINGO OOPHERECTOMY Bilateral 06/07/2022   Procedure: XI ROBOTIC ASSISTED TOTAL HYSTERECTOMY WITH BILATERAL SALPINGO OOPHORECTOMY;  Surgeon: Suzi Essex, MD;  Location: [facility];  Service: Gynecology;  Laterality: Bilateral;   SIMPLE MASTECTOMY WITH AXILLARY SENTINEL NODE BIOPSY Bilateral 06/22/2021   Procedure: BILATERAL  MASTECTOMIES WITH IMMEDIATE RECONSTRUCTION, MAGTRACE INJECTION RIGHT BREAST;  Surgeon: Dareen Ebbing, MD;  Location: [facility];  Service: General;  Laterality: Bilateral;  LMA PEC BLOCK   UPPER GASTROINTESTINAL ENDOSCOPY      OB History     Gravida  6   Para  5   Term  5   Preterm      AB  1   Living  4      SAB  1   IAB  Ectopic      Multiple      Live Births  5            Home Medications    Prior to Admission medications   Medication Sig Start Date End Date Taking? Authorizing Provider  dicyclomine (BENTYL) 20 MG tablet Take 1 tablet (20 mg total) by mouth 3 (three) times daily as needed for spasms (abdominal cramps). 06/09/23  Yes Kathyjo Briere, PA  ondansetron  (ZOFRAN -ODT) 4 MG disintegrating tablet Take 1 tablet (4 mg  total) by mouth every 8 (eight) hours as needed for up to 2 days for nausea or vomiting. 06/09/23 06/11/23 Yes Jesusa Stenerson, PA    Family History Family History  Problem Relation Age of Onset   Uterine cancer Mother    Diabetes Father    Kidney disease Father    Diabetes Sister    Uterine cancer Sister    Diabetes Sister    Colon cancer Maternal Uncle    Esophageal cancer Neg Hx    Rectal cancer Neg Hx    Stomach cancer Neg Hx     Social History Social History   Tobacco Use   Smoking status: Never    Passive exposure: Past   Smokeless tobacco: Former    Types: Snuff, Chew  Vaping Use   Vaping status: Never Used  Substance Use Topics   Alcohol use: No   Drug use: No     Allergies   Patient has no known allergies.   Review of Systems Review of Systems  Constitutional:  Positive for appetite change, fatigue and fever. Negative for chills and diaphoresis.  HENT:  Negative for congestion, rhinorrhea, sore throat and trouble swallowing.   Respiratory:  Negative for cough and stridor.   Gastrointestinal:  Positive for abdominal pain, diarrhea and nausea. Negative for abdominal distention, blood in stool and vomiting.  Genitourinary:  Negative for dysuria.  Musculoskeletal:  Negative for myalgias.  Neurological:  Negative for dizziness and headaches.     Physical Exam Triage Vital Signs ED Triage Vitals  Encounter Vitals Group     BP 06/09/23 0923 106/74     Systolic BP Percentile --      Diastolic BP Percentile --      Pulse Rate 06/09/23 0923 72     Resp 06/09/23 0923 18     Temp 06/09/23 0923 98.5 F (36.9 C)     Temp Source 06/09/23 0923 Oral     SpO2 06/09/23 0923 97 %     Weight --      Height --      Head Circumference --      Peak Flow --      Pain Score 06/09/23 0918 7     Pain Loc --      Pain Education --      Exclude from Growth Chart --    No data found.  Updated Vital Signs BP 106/74 (BP Location: Right Arm)   Pulse 72   Temp 98.5 F  (36.9 C) (Oral)   Resp 18   LMP 08/29/2021 (Approximate)   SpO2 97%   Visual Acuity Right Eye Distance:   Left Eye Distance:   Bilateral Distance:    Right Eye Near:   Left Eye Near:    Bilateral Near:     Physical Exam Vitals and nursing note reviewed.  Constitutional:      Appearance: Normal appearance.  HENT:     Head: Atraumatic.     Mouth/Throat:  Mouth: Mucous membranes are moist.  Eyes:     Conjunctiva/sclera: Conjunctivae normal.  Cardiovascular:     Rate and Rhythm: Normal rate and regular rhythm.     Heart sounds: Normal heart sounds.  Pulmonary:     Breath sounds: Normal breath sounds.  Abdominal:     General: Bowel sounds are normal.     Palpations: Abdomen is soft.     Tenderness: There is no abdominal tenderness. There is no guarding.  Musculoskeletal:     Cervical back: Neck supple.  Lymphadenopathy:     Cervical: No cervical adenopathy.  Skin:    Comments: Skin is clammy  Psychiatric:        Mood and Affect: Mood normal.      UC Treatments / Results  Labs (all labs ordered are listed, but only abnormal results are displayed) Labs Reviewed - No data to display  EKG   Radiology No results found.  Procedures Procedures (including critical care time)  Medications Ordered in UC Medications - No data to display  Initial Impression / Assessment and Plan / UC Course  I have reviewed the triage vital signs and the nursing notes.  Pertinent labs & imaging results that were available during my care of the patient were reviewed by me and considered in my medical decision making (see chart for details).     48 year old female past medical history of breast cancer and hysterectomy presents with 4 days of diarrhea nausea fever and intermittent crampy abdominal pain, he is taking over-the-counter Motrin  Tylenol  and Imodium with gradual improvement.  Admits feeling sweaty today but no documented fever.  She is clammy but well-appearing,  well-hydrated, nontoxic, cooperative, her vital signs are stable her exam other than clammy skin is normal.  Multiple family members with similar symptoms no recent travel or bad food exposure likely viral etiology will prescribe Bentyl for cramps, Zofran  for nausea, using translator discussed dietary management of diarrhea and need for increase fluids containing electrolytes.  Warning signs and follow-up reviewed Final Clinical Impressions(s) / UC Diagnoses   Final diagnoses:  Diarrhea, unspecified type  Abdominal pain, unspecified abdominal location  Nausea without vomiting   Discharge Instructions   None    ED Prescriptions     Medication Sig Dispense Auth. Provider   dicyclomine (BENTYL) 20 MG tablet Take 1 tablet (20 mg total) by mouth 3 (three) times daily as needed for spasms (abdominal cramps). 10 tablet Oluwateniola Leitch, PA   ondansetron  (ZOFRAN -ODT) 4 MG disintegrating tablet Take 1 tablet (4 mg total) by mouth every 8 (eight) hours as needed for up to 2 days for nausea or vomiting. 6 tablet Lillyth Spong, [place]      PDMP not reviewed this encounter.   Doctor Sheahan, [place] 06/09/23 1029

## 2023-09-10 ENCOUNTER — Ambulatory Visit: Admitting: Internal Medicine

## 2023-11-01 ENCOUNTER — Telehealth: Payer: Self-pay

## 2023-11-01 NOTE — Telephone Encounter (Signed)
 Given her BRCA mutation, I think if she's having new symptoms, we could offer to have her see Dr. OVERTON or Eleanor. Could also get a UA and culture if having pain/pressure.

## 2023-11-01 NOTE — Telephone Encounter (Signed)
 Per Eleanor Epps NP, Alm, is aware of recommendation for [facility] to follow up with her PCP, since all was negative from hysterectomy over a year ago.  He states she has been to [facility] and will follow up there.

## 2023-11-01 NOTE — Telephone Encounter (Signed)
 Alm, pt's husband called stating his wife, Toni [facility], has been having low pelvic/vaginal pain. The pain started 2 days ago, is not constant and 3-4/10 on pain scale. She described it as the feeling when you have to pee really bad more pressure than pain. It does get better when she lays down. Is not taking any medication for the pain. Reports no vaginal bleeding, fever/chills,N/V. No constipation, no vaginal discharge/odor. Eating/drinking fine. No UTI S&S (pain,pressure, burning, frequency or urgency). BM'S are normal and has no pain with them. No strenuous exercise, no recent intercourse  Aware message will be sent to provider and office will call with advice/recommendations

## 2023-11-05 ENCOUNTER — Telehealth: Payer: Self-pay

## 2023-11-05 NOTE — Telephone Encounter (Signed)
 I reached out to alm, Rosies husband regarding a follow up call from concern last week with pelvic pain.   He reports they went to the PCP at [facility]. She had a UTI and medication was given. She is feeling much better. He was thankful for the call.
